# Patient Record
Sex: Female | Born: 1945 | Race: Black or African American | Hispanic: No | Smoking: Former smoker
Health system: Southern US, Community
[De-identification: ages and names within clinical notes are randomized; demographics above are authoritative.]

## PROBLEM LIST (undated history)

## (undated) DIAGNOSIS — F0390 Unspecified dementia without behavioral disturbance: Secondary | ICD-10-CM

## (undated) DIAGNOSIS — K5900 Constipation, unspecified: Secondary | ICD-10-CM

## (undated) DIAGNOSIS — E039 Hypothyroidism, unspecified: Secondary | ICD-10-CM

## (undated) DIAGNOSIS — K219 Gastro-esophageal reflux disease without esophagitis: Secondary | ICD-10-CM

## (undated) DIAGNOSIS — E119 Type 2 diabetes mellitus without complications: Secondary | ICD-10-CM

## (undated) DIAGNOSIS — M1990 Unspecified osteoarthritis, unspecified site: Secondary | ICD-10-CM

## (undated) DIAGNOSIS — I10 Essential (primary) hypertension: Secondary | ICD-10-CM

## (undated) HISTORY — DX: Gastro-esophageal reflux disease without esophagitis: K21.9

## (undated) HISTORY — PX: TUBAL LIGATION: SHX77

## (undated) HISTORY — DX: Unspecified osteoarthritis, unspecified site: M19.90

## (undated) HISTORY — DX: Constipation, unspecified: K59.00

---

## 2009-04-19 ENCOUNTER — Ambulatory Visit: Payer: Self-pay | Admitting: Family Medicine

## 2014-03-15 ENCOUNTER — Ambulatory Visit: Payer: Self-pay | Admitting: Internal Medicine

## 2014-03-16 LAB — IRON AND TIBC
IRON: 36 ug/dL — AB (ref 50–170)
Iron Bind.Cap.(Total): 318 ug/dL (ref 250–450)
Iron Saturation: 11 %
Unbound Iron-Bind.Cap.: 282 ug/dL

## 2014-03-16 LAB — CBC CANCER CENTER
Eosinophil: 2 %
HCT: 39.1 % (ref 35.0–47.0)
HGB: 12.2 g/dL (ref 12.0–16.0)
Lymphocytes: 29 %
MCH: 23.6 pg — ABNORMAL LOW (ref 26.0–34.0)
MCHC: 31.1 g/dL — ABNORMAL LOW (ref 32.0–36.0)
MCV: 76 fL — ABNORMAL LOW (ref 80–100)
Monocytes: 6 %
Platelet: 417 x10 3/mm (ref 150–440)
RBC: 5.15 10*6/uL (ref 3.80–5.20)
RDW: 15.2 % — ABNORMAL HIGH (ref 11.5–14.5)
Segmented Neutrophils: 62 %
Variant Lymphocyte: 1 %
WBC: 11.7 x10 3/mm — ABNORMAL HIGH (ref 3.6–11.0)

## 2014-03-16 LAB — FERRITIN: Ferritin (ARMC): 227 ng/mL (ref 8–388)

## 2014-03-23 ENCOUNTER — Ambulatory Visit: Payer: Self-pay | Admitting: Internal Medicine

## 2014-04-04 LAB — OCCULT BLOOD X 1 CARD TO LAB, STOOL
OCCULT BLOOD, FECES: NEGATIVE
Occult Blood, Feces: NEGATIVE

## 2014-04-22 ENCOUNTER — Ambulatory Visit: Payer: Self-pay | Admitting: Internal Medicine

## 2014-05-06 ENCOUNTER — Ambulatory Visit: Payer: Self-pay | Admitting: Family

## 2014-07-05 ENCOUNTER — Ambulatory Visit: Payer: Self-pay | Admitting: Internal Medicine

## 2014-07-05 LAB — CBC CANCER CENTER
Basophil #: 0.1 x10 3/mm (ref 0.0–0.1)
Basophil %: 0.8 %
EOS ABS: 0.7 x10 3/mm (ref 0.0–0.7)
Eosinophil %: 7.9 %
HCT: 38 % (ref 35.0–47.0)
HGB: 12.1 g/dL (ref 12.0–16.0)
LYMPHS ABS: 2 x10 3/mm (ref 1.0–3.6)
Lymphocyte %: 21.9 %
MCH: 24.5 pg — ABNORMAL LOW (ref 26.0–34.0)
MCHC: 31.9 g/dL — ABNORMAL LOW (ref 32.0–36.0)
MCV: 77 fL — ABNORMAL LOW (ref 80–100)
MONO ABS: 0.7 x10 3/mm (ref 0.2–0.9)
Monocyte %: 7.8 %
Neutrophil #: 5.7 x10 3/mm (ref 1.4–6.5)
Neutrophil %: 61.6 %
Platelet: 302 x10 3/mm (ref 150–440)
RBC: 4.95 10*6/uL (ref 3.80–5.20)
RDW: 16 % — ABNORMAL HIGH (ref 11.5–14.5)
WBC: 9.3 x10 3/mm (ref 3.6–11.0)

## 2014-07-05 LAB — IRON AND TIBC
IRON BIND. CAP.(TOTAL): 334 ug/dL (ref 250–450)
IRON: 54 ug/dL (ref 50–170)
Iron Saturation: 16 %
Unbound Iron-Bind.Cap.: 280 ug/dL

## 2014-07-05 LAB — FERRITIN: FERRITIN (ARMC): 159 ng/mL (ref 8–388)

## 2014-07-23 ENCOUNTER — Ambulatory Visit: Payer: Self-pay | Admitting: Internal Medicine

## 2018-03-19 ENCOUNTER — Other Ambulatory Visit: Payer: Self-pay | Admitting: Family Medicine

## 2018-03-19 DIAGNOSIS — Z1231 Encounter for screening mammogram for malignant neoplasm of breast: Secondary | ICD-10-CM

## 2018-06-03 ENCOUNTER — Ambulatory Visit
Admission: RE | Admit: 2018-06-03 | Discharge: 2018-06-03 | Disposition: A | Discharge status: Home / Self Care | Payer: No Typology Code available for payment source | Source: Ambulatory Visit | Attending: Family Medicine | Admitting: Family Medicine

## 2018-06-03 ENCOUNTER — Encounter: Payer: Self-pay | Admitting: Radiology

## 2018-06-03 DIAGNOSIS — Z1231 Encounter for screening mammogram for malignant neoplasm of breast: Secondary | ICD-10-CM | POA: Insufficient documentation

## 2018-07-20 ENCOUNTER — Other Ambulatory Visit: Payer: Self-pay | Admitting: Family Medicine

## 2018-07-20 DIAGNOSIS — R921 Mammographic calcification found on diagnostic imaging of breast: Secondary | ICD-10-CM

## 2018-07-20 DIAGNOSIS — R928 Other abnormal and inconclusive findings on diagnostic imaging of breast: Secondary | ICD-10-CM

## 2018-07-29 ENCOUNTER — Ambulatory Visit
Admission: RE | Admit: 2018-07-29 | Discharge: 2018-07-29 | Disposition: A | Discharge status: Home / Self Care | Payer: No Typology Code available for payment source | Source: Ambulatory Visit | Attending: Family Medicine | Admitting: Family Medicine

## 2018-07-29 DIAGNOSIS — R928 Other abnormal and inconclusive findings on diagnostic imaging of breast: Secondary | ICD-10-CM | POA: Insufficient documentation

## 2018-07-29 DIAGNOSIS — R921 Mammographic calcification found on diagnostic imaging of breast: Secondary | ICD-10-CM

## 2018-08-06 ENCOUNTER — Other Ambulatory Visit: Payer: Self-pay | Admitting: Family Medicine

## 2018-08-06 DIAGNOSIS — R921 Mammographic calcification found on diagnostic imaging of breast: Secondary | ICD-10-CM

## 2018-08-06 DIAGNOSIS — R928 Other abnormal and inconclusive findings on diagnostic imaging of breast: Secondary | ICD-10-CM

## 2018-09-02 ENCOUNTER — Ambulatory Visit
Admission: RE | Admit: 2018-09-02 | Discharge: 2018-09-02 | Disposition: A | Discharge status: Home / Self Care | Payer: No Typology Code available for payment source | Source: Ambulatory Visit | Attending: Family Medicine | Admitting: Family Medicine

## 2018-09-02 DIAGNOSIS — R928 Other abnormal and inconclusive findings on diagnostic imaging of breast: Secondary | ICD-10-CM | POA: Diagnosis not present

## 2018-09-02 DIAGNOSIS — R921 Mammographic calcification found on diagnostic imaging of breast: Secondary | ICD-10-CM

## 2018-09-02 HISTORY — PX: BREAST BIOPSY: SHX20

## 2018-09-03 LAB — SURGICAL PATHOLOGY

## 2018-09-11 ENCOUNTER — Other Ambulatory Visit: Payer: Self-pay | Admitting: Family Medicine

## 2018-09-11 DIAGNOSIS — Z1231 Encounter for screening mammogram for malignant neoplasm of breast: Secondary | ICD-10-CM

## 2020-02-09 ENCOUNTER — Other Ambulatory Visit: Payer: Self-pay

## 2020-02-09 ENCOUNTER — Inpatient Hospital Stay: Payer: Medicare (Managed Care)

## 2020-02-09 ENCOUNTER — Encounter: Payer: Self-pay | Admitting: Emergency Medicine

## 2020-02-09 ENCOUNTER — Inpatient Hospital Stay
Admission: EM | Admit: 2020-02-09 | Discharge: 2020-02-14 | DRG: 683 | Disposition: A | Discharge status: Skilled Nursing Facility | Payer: Medicare (Managed Care) | Attending: Internal Medicine | Admitting: Internal Medicine

## 2020-02-09 DIAGNOSIS — R1310 Dysphagia, unspecified: Secondary | ICD-10-CM | POA: Diagnosis present

## 2020-02-09 DIAGNOSIS — Z79899 Other long term (current) drug therapy: Secondary | ICD-10-CM | POA: Diagnosis not present

## 2020-02-09 DIAGNOSIS — E1169 Type 2 diabetes mellitus with other specified complication: Secondary | ICD-10-CM | POA: Diagnosis present

## 2020-02-09 DIAGNOSIS — Z8249 Family history of ischemic heart disease and other diseases of the circulatory system: Secondary | ICD-10-CM | POA: Diagnosis not present

## 2020-02-09 DIAGNOSIS — Z79890 Hormone replacement therapy (postmenopausal): Secondary | ICD-10-CM

## 2020-02-09 DIAGNOSIS — Z66 Do not resuscitate: Secondary | ICD-10-CM | POA: Diagnosis present

## 2020-02-09 DIAGNOSIS — I10 Essential (primary) hypertension: Secondary | ICD-10-CM | POA: Diagnosis present

## 2020-02-09 DIAGNOSIS — R531 Weakness: Secondary | ICD-10-CM | POA: Diagnosis not present

## 2020-02-09 DIAGNOSIS — Z87891 Personal history of nicotine dependence: Secondary | ICD-10-CM | POA: Diagnosis not present

## 2020-02-09 DIAGNOSIS — Z20822 Contact with and (suspected) exposure to covid-19: Secondary | ICD-10-CM | POA: Diagnosis present

## 2020-02-09 DIAGNOSIS — Z9181 History of falling: Secondary | ICD-10-CM | POA: Diagnosis not present

## 2020-02-09 DIAGNOSIS — N289 Disorder of kidney and ureter, unspecified: Secondary | ICD-10-CM

## 2020-02-09 DIAGNOSIS — E039 Hypothyroidism, unspecified: Secondary | ICD-10-CM | POA: Diagnosis present

## 2020-02-09 DIAGNOSIS — I951 Orthostatic hypotension: Secondary | ICD-10-CM | POA: Diagnosis present

## 2020-02-09 DIAGNOSIS — R197 Diarrhea, unspecified: Secondary | ICD-10-CM | POA: Diagnosis not present

## 2020-02-09 DIAGNOSIS — E1122 Type 2 diabetes mellitus with diabetic chronic kidney disease: Secondary | ICD-10-CM | POA: Diagnosis not present

## 2020-02-09 DIAGNOSIS — F0390 Unspecified dementia without behavioral disturbance: Secondary | ICD-10-CM | POA: Diagnosis present

## 2020-02-09 DIAGNOSIS — M25511 Pain in right shoulder: Secondary | ICD-10-CM | POA: Diagnosis not present

## 2020-02-09 DIAGNOSIS — M19011 Primary osteoarthritis, right shoulder: Secondary | ICD-10-CM | POA: Diagnosis present

## 2020-02-09 DIAGNOSIS — M47812 Spondylosis without myelopathy or radiculopathy, cervical region: Secondary | ICD-10-CM | POA: Diagnosis present

## 2020-02-09 DIAGNOSIS — N390 Urinary tract infection, site not specified: Secondary | ICD-10-CM

## 2020-02-09 DIAGNOSIS — N179 Acute kidney failure, unspecified: Secondary | ICD-10-CM | POA: Diagnosis not present

## 2020-02-09 DIAGNOSIS — Z7982 Long term (current) use of aspirin: Secondary | ICD-10-CM | POA: Diagnosis not present

## 2020-02-09 DIAGNOSIS — N3000 Acute cystitis without hematuria: Secondary | ICD-10-CM | POA: Diagnosis present

## 2020-02-09 DIAGNOSIS — G8929 Other chronic pain: Secondary | ICD-10-CM | POA: Diagnosis not present

## 2020-02-09 DIAGNOSIS — E785 Hyperlipidemia, unspecified: Secondary | ICD-10-CM | POA: Diagnosis not present

## 2020-02-09 DIAGNOSIS — N186 End stage renal disease: Secondary | ICD-10-CM

## 2020-02-09 DIAGNOSIS — G4700 Insomnia, unspecified: Secondary | ICD-10-CM | POA: Diagnosis not present

## 2020-02-09 DIAGNOSIS — Z992 Dependence on renal dialysis: Secondary | ICD-10-CM | POA: Diagnosis not present

## 2020-02-09 DIAGNOSIS — R52 Pain, unspecified: Secondary | ICD-10-CM

## 2020-02-09 DIAGNOSIS — I959 Hypotension, unspecified: Secondary | ICD-10-CM

## 2020-02-09 HISTORY — DX: Essential (primary) hypertension: I10

## 2020-02-09 HISTORY — DX: Hypothyroidism, unspecified: E03.9

## 2020-02-09 HISTORY — DX: Type 2 diabetes mellitus without complications: E11.9

## 2020-02-09 LAB — COMPREHENSIVE METABOLIC PANEL
ALT: 29 U/L (ref 0–44)
AST: 37 U/L (ref 15–41)
Albumin: 2.8 g/dL — ABNORMAL LOW (ref 3.5–5.0)
Alkaline Phosphatase: 76 U/L (ref 38–126)
Anion gap: 13 (ref 5–15)
BUN: 79 mg/dL — ABNORMAL HIGH (ref 8–23)
CO2: 27 mmol/L (ref 22–32)
Calcium: 8.7 mg/dL — ABNORMAL LOW (ref 8.9–10.3)
Chloride: 92 mmol/L — ABNORMAL LOW (ref 98–111)
Creatinine, Ser: 2.63 mg/dL — ABNORMAL HIGH (ref 0.44–1.00)
GFR calc Af Amer: 20 mL/min — ABNORMAL LOW (ref >60)
GFR calc non Af Amer: 17 mL/min — ABNORMAL LOW (ref >60)
Glucose, Bld: 177 mg/dL — ABNORMAL HIGH (ref 70–99)
Potassium: 4 mmol/L (ref 3.5–5.1)
Sodium: 132 mmol/L — ABNORMAL LOW (ref 135–145)
Total Bilirubin: 1 mg/dL (ref 0.3–1.2)
Total Protein: 6 g/dL — ABNORMAL LOW (ref 6.5–8.1)

## 2020-02-09 LAB — URINALYSIS, COMPLETE (UACMP) WITH MICROSCOPIC
Bilirubin Urine: NEGATIVE
Glucose, UA: NEGATIVE mg/dL
Hgb urine dipstick: NEGATIVE
Ketones, ur: NEGATIVE mg/dL
Nitrite: NEGATIVE
Protein, ur: NEGATIVE mg/dL
Specific Gravity, Urine: 1.013 (ref 1.005–1.030)
pH: 6 (ref 5.0–8.0)

## 2020-02-09 LAB — CBC
HCT: 35.3 % — ABNORMAL LOW (ref 36.0–46.0)
Hemoglobin: 11.8 g/dL — ABNORMAL LOW (ref 12.0–15.0)
MCH: 25.3 pg — ABNORMAL LOW (ref 26.0–34.0)
MCHC: 33.4 g/dL (ref 30.0–36.0)
MCV: 75.6 fL — ABNORMAL LOW (ref 80.0–100.0)
Platelets: 253 10*3/uL (ref 150–400)
RBC: 4.67 MIL/uL (ref 3.87–5.11)
RDW: 15.9 % — ABNORMAL HIGH (ref 11.5–15.5)
WBC: 11.8 10*3/uL — ABNORMAL HIGH (ref 4.0–10.5)
nRBC: 0.4 % — ABNORMAL HIGH (ref 0.0–0.2)

## 2020-02-09 LAB — HEMOGLOBIN A1C
Hgb A1c MFr Bld: 7.4 % — ABNORMAL HIGH (ref 4.8–5.6)
Mean Plasma Glucose: 165.68 mg/dL

## 2020-02-09 LAB — GLUCOSE, CAPILLARY
Glucose-Capillary: 172 mg/dL — ABNORMAL HIGH (ref 70–99)
Glucose-Capillary: 97 mg/dL (ref 70–99)

## 2020-02-09 LAB — CK: Total CK: 270 U/L — ABNORMAL HIGH (ref 38–234)

## 2020-02-09 MED ORDER — ACETAMINOPHEN 650 MG RE SUPP: 650.0000 mg | Freq: Four times a day (QID) | RECTAL | Status: DC | PRN

## 2020-02-09 MED ORDER — HEPARIN SODIUM (PORCINE) 5000 UNIT/ML IJ SOLN
5000.0000 [IU] | Freq: Three times a day (TID) | INTRAMUSCULAR | Status: DC
  Administered 2020-02-09 – 2020-02-14 (×15): 5000 [IU] via SUBCUTANEOUS
  Filled 2020-02-09 (×15): qty 1

## 2020-02-09 MED ORDER — SODIUM CHLORIDE 0.9 % IV SOLN: INTRAVENOUS | Status: DC

## 2020-02-09 MED ORDER — DULOXETINE HCL 30 MG PO CPEP
60.0000 mg | ORAL_CAPSULE | Freq: Every day | ORAL | Status: DC
  Administered 2020-02-10 – 2020-02-14 (×5): 60 mg via ORAL
  Filled 2020-02-09 (×4): qty 2
  Filled 2020-02-09: qty 1

## 2020-02-09 MED ORDER — SODIUM CHLORIDE 0.9 % IV SOLN
1.0000 g | Freq: Once | INTRAVENOUS | Status: AC
  Administered 2020-02-09: 1 g via INTRAVENOUS
  Filled 2020-02-09: qty 10

## 2020-02-09 MED ORDER — INSULIN ASPART 100 UNIT/ML ~~LOC~~ SOLN
0.0000 [IU] | Freq: Three times a day (TID) | SUBCUTANEOUS | Status: DC
  Administered 2020-02-09: 2 [IU] via SUBCUTANEOUS
  Administered 2020-02-10: 1 [IU] via SUBCUTANEOUS
  Administered 2020-02-11: 2 [IU] via SUBCUTANEOUS
  Administered 2020-02-11: 1 [IU] via SUBCUTANEOUS
  Administered 2020-02-12: 3 [IU] via SUBCUTANEOUS
  Administered 2020-02-12 – 2020-02-13 (×3): 2 [IU] via SUBCUTANEOUS
  Administered 2020-02-13 – 2020-02-14 (×3): 1 [IU] via SUBCUTANEOUS
  Filled 2020-02-09 (×11): qty 1

## 2020-02-09 MED ORDER — ONDANSETRON HCL 4 MG PO TABS: 4.0000 mg | ORAL_TABLET | Freq: Four times a day (QID) | ORAL | Status: DC | PRN

## 2020-02-09 MED ORDER — ONDANSETRON HCL 4 MG/2ML IJ SOLN: 4.0000 mg | Freq: Four times a day (QID) | INTRAMUSCULAR | Status: DC | PRN

## 2020-02-09 MED ORDER — LEVOTHYROXINE SODIUM 50 MCG PO TABS
50.0000 ug | ORAL_TABLET | Freq: Every day | ORAL | Status: DC
  Administered 2020-02-10 – 2020-02-14 (×5): 50 ug via ORAL
  Filled 2020-02-09 (×5): qty 1

## 2020-02-09 MED ORDER — ASPIRIN EC 81 MG PO TBEC
81.0000 mg | DELAYED_RELEASE_TABLET | Freq: Every day | ORAL | Status: DC
  Administered 2020-02-10 – 2020-02-14 (×5): 81 mg via ORAL
  Filled 2020-02-09 (×5): qty 1

## 2020-02-09 MED ORDER — INSULIN ASPART 100 UNIT/ML ~~LOC~~ SOLN: 0.0000 [IU] | Freq: Every day | SUBCUTANEOUS | Status: DC

## 2020-02-09 MED ORDER — ACETAMINOPHEN 325 MG PO TABS
650.0000 mg | ORAL_TABLET | Freq: Four times a day (QID) | ORAL | Status: DC | PRN
  Administered 2020-02-10 – 2020-02-13 (×5): 650 mg via ORAL
  Filled 2020-02-09 (×6): qty 2

## 2020-02-09 NOTE — H&P (Signed)
[facility] NAME: Alexis Mclaughlin    MR#:  [id number]  DATE OF BIRTH:  November 25, 1946  DATE OF ADMISSION:  02/09/2020  PRIMARY CARE PHYSICIAN: [facility]  REQUESTING/REFERRING PHYSICIAN: Dr Harvest Dark  CHIEF COMPLAINT:   Chief Complaint  Patient presents with  . Urinary Tract Infection    HISTORY OF PRESENT ILLNESS:  Alexis Mclaughlin  is a 74 y.o. female with a known history of hypertension and diabetes.  Today she was sent in by the pace program because they found her on the ground and she could not get up.  The patient does not complain of any burning on urination or any blood in the urine and otherwise feels okay.  She complains of some chronic shoulder pain.  Nothing hurts from the fall.  She does feel little bit weaker.  In the ER, her creatinine was up at 2.63 and her blood pressure on the lower side and she did have a positive urine analysis.  Hospitalist services were contacted for admission.  PAST MEDICAL HISTORY:   Past Medical History:  Diagnosis Date  . Diabetes mellitus without complication ([place])   . Hypertension   . Hypothyroidism     PAST SURGICAL HISTORY:   Past Surgical History:  Procedure Laterality Date  . BREAST BIOPSY Right 09/02/2018   affirm stereo/path pending    SOCIAL HISTORY:   Social History   Tobacco Use  . Smoking status: Former Research scientist (life sciences)  . Smokeless tobacco: Never Used  Substance Use Topics  . Alcohol use: Not Currently    FAMILY HISTORY:   Family History  Problem Relation Age of Onset  . CAD Mother   . CAD Father     DRUG ALLERGIES:  No Known Allergies  REVIEW OF SYSTEMS:  CONSTITUTIONAL: No fever, fatigue or weakness.  EYES: No blurred or double vision.  EARS, NOSE, AND THROAT: No tinnitus or ear pain. No sore throat RESPIRATORY: No cough, shortness of breath, wheezing or hemoptysis.  CARDIOVASCULAR: No chest pain, orthopnea, edema.  GASTROINTESTINAL: No nausea,  vomiting, diarrhea or abdominal pain. No blood in bowel movements GENITOURINARY: No dysuria, hematuria.  ENDOCRINE: No polyuria, nocturia,  HEMATOLOGY: No anemia, easy bruising or bleeding SKIN: No rash or lesion. MUSCULOSKELETAL: Chronic right shoulder pain NEUROLOGIC: No tingling, numbness.  PSYCHIATRY: No anxiety or depression.   MEDICATIONS AT HOME:   Prior to Admission medications   Medication Sig Start Date End Date Taking? Authorizing Provider  aspirin EC 81 MG tablet Take 81 mg by mouth daily.    [provider]  atenolol (TENORMIN) 100 MG tablet Take 100 mg by mouth daily.    [provider]  atorvastatin (LIPITOR) 80 MG tablet Take 80 mg by mouth daily.    [provider]  DULoxetine (CYMBALTA) 60 MG capsule Take 60 mg by mouth daily.    [provider]  levothyroxine (SYNTHROID) 50 MCG tablet Take 50 mcg by mouth daily before breakfast.    [provider]  losartan-hydrochlorothiazide (HYZAAR) 100-25 MG tablet Take 1 tablet by mouth daily.    [provider]  sulfamethoxazole-trimethoprim (BACTRIM DS) 800-160 MG tablet Take 1 tablet by mouth 2 (two) times daily.    [provider]      VITAL SIGNS:  Blood pressure (!) 92/53, pulse 69, temperature 98.8 F (37.1 C), temperature source Oral, resp. rate 16, height 5\' 3"  (1.6 m), weight 90.7 kg, SpO2 100 %.  PHYSICAL EXAMINATION:  GENERAL:  74  y.o.-year-old patient lying in the bed with no acute distress.  EYES: Pupils equal, round, reactive to light and accommodation. No scleral icterus. Extraocular muscles intact.  HEENT: Head atraumatic, normocephalic. Oropharynx and nasopharynx clear.  NECK:  Supple, no jugular venous distention. No thyroid enlargement, no tenderness.  LUNGS: Normal breath sounds bilaterally, no wheezing, rales,rhonchi or crepitation. No use of accessory muscles of respiration.  CARDIOVASCULAR: S1, S2 normal. No murmurs, rubs, or gallops.   ABDOMEN: Soft, nontender, nondistended. Bowel sounds present. No organomegaly or mass.  EXTREMITIES: No pedal edema, cyanosis, or clubbing.  No pain to palpation over the hips.  Slight pain in the right shoulder to palpation.  Limited range of motion right shoulder unable to lift fully overhead. NEUROLOGIC: Cranial nerves II through XII are intact. Muscle strength 5/5 in all extremities. Sensation intact. Gait not checked.  Patient able to straight leg raise. PSYCHIATRIC: The patient is alert and answers questions appropriately.  SKIN: No rash, lesion, or ulcer.   LABORATORY PANEL:   CBC Recent Labs  Lab 02/09/20 1349  WBC 11.8*  HGB 11.8*  HCT 35.3*  PLT 253   ------------------------------------------------------------------------------------------------------------------  Chemistries  Recent Labs  Lab 02/09/20 1349  NA 132*  K 4.0  CL 92*  CO2 27  GLUCOSE 177*  BUN 79*  CREATININE 2.63*  CALCIUM 8.7*  AST 37  ALT 29  ALKPHOS 76  BILITOT 1.0   ------------------------------------------------------------------------------------------------------------------    IMPRESSION AND PLAN:   1.  Acute kidney injury.  Creatinine 2.63 today and yesterday with labs was 2.39.  Looking at her baseline was 0.85 back in September 2020.  Gentle IV fluid hydration.  Hold antihypertensive medications.  Discontinue Bactrim. 2.  Relative hypotension.  Give IV fluids.  Hold atenolol and Hyzaar. 3.  Acute cystitis without hematuria, with leukocytosis.  Follow-up urine culture.  On Rocephin. 4.  Type 2 diabetes mellitus with hyperlipidemia we will put on sliding scale.  Add on a hemoglobin A1c 5.  Right shoulder pain with limited motion of the right shoulder will get an x-ray and Occupational Therapy evaluation. 6.  Hypothyroidism unspecified on levothyroxine 7.  Weakness.  We will get physical therapy evaluation 8.  Hyperlipidemia unspecified.  We will add on a CPK.  Hold statin until  CPK back. 9.  Reviewed MOST form sent in by pace physicians and she is a DNR.  Patient was also told me she is a DNR.  All the records are reviewed and case discussed with ED provider. Management plans discussed with the patient, family and they are in agreement.  CODE STATUS: DNR  TOTAL TIME TAKING CARE OF THIS PATIENT: 50 minutes.    Loletha Grayer M.D on 02/09/2020 at 3:27 PM  Between 7am to 6pm - Pager - [phone]  After 6pm call admission pager [phone]  [facility]  CC: Primary care physician; pace physician

## 2020-02-09 NOTE — ED Provider Notes (Signed)
[facility] Emergency Department Provider Note  Time seen: 1:43 PM  I have reviewed the triage vital signs and the nursing notes.   HISTORY  Chief Complaint Urinary Tract Infection  HPI Alexis Mclaughlin is a 74 y.o. female who presents to the emergency department for possible urinary tract infection.   Patient is coming from [facility] per EMS, was diagnosed with urinary tract infection.  Home health was at the patient's home going to give her an antibiotic the family stated patient has been somewhat uncooperative recently and they wanted her sent to the emergency department for further evaluation.  Currently the patient is awake alert oriented x4, no acute distress.  Patient initially reluctant to lab work but is ultimately agreeable.  Patient denies any fever cough or shortness of breath.  Denies any abdominal pain or dysuria.  No past medical history on file.  There are no problems to display for this patient.   Past Surgical History:  Procedure Laterality Date  . BREAST BIOPSY Right 09/02/2018   affirm stereo/path pending    Prior to Admission medications   Not on File    Not on File  No family history on file.  Social History Social History   Tobacco Use  . Smoking status: Not on file  Substance Use Topics  . Alcohol use: Not on file  . Drug use: Not on file    Review of Systems Constitutional: Negative for fever. Cardiovascular: Negative for chest pain. Respiratory: Negative for shortness of breath. Gastrointestinal: Negative for abdominal pain Genitourinary: Denies dysuria. Musculoskeletal: Negative for musculoskeletal complaints Neurological: Negative for headache All other ROS negative  ____________________________________________   PHYSICAL EXAM:  Constitutional: Alert and oriented. Well appearing and in no distress. Eyes: Normal exam ENT      Head: Normocephalic and atraumatic.      Mouth/Throat: Mucous membranes are  moist. Cardiovascular: Normal rate, regular rhythm. Respiratory: Normal respiratory effort without tachypnea nor retractions. Breath sounds are clear  Gastrointestinal: Soft and nontender. No distention.  Musculoskeletal: Nontender with normal range of motion in all extremities. Neurologic:  Normal speech and language. No gross focal neurologic deficits Skin:  Skin is warm, dry and intact.  Psychiatric: Mood and affect are normal.   ____________________________________________  INITIAL IMPRESSION / ASSESSMENT AND PLAN / ED COURSE  Pertinent labs & imaging results that were available during my care of the patient were reviewed by me and considered in my medical decision making (see chart for details).   Patient presents to the emergency department for evaluation of uncooperative behavior and recently diagnosed urinary tract infection.  Currently patient is awake alert oriented x4.  Denies any pain.  Denies dysuria.  Largely negative review of systems.  When asked how she knows she has urinary tract infection she states because "they told me so."  As home health was involved I presume patient likely had a urinalysis performed already.  We will check a urine sample, blood work and continue to closely monitor.  Patient's urinalysis shows possible urinary tract infection.  Patient's lab work shows a creatinine of 2.6.  I was able to talk to the pace physician who states her baseline creatinine is around 1.5.  States the patient has been confused over the past 1 week, has not been taking any of her medications.  They went to the house today to try to treat her for her urinary tract infection, however they found the patient on the ground and she appeared somewhat confused they  were able to get the patient in bed able to start IV fluids.  However they thought due to the patient's confusion and weakness along with labs showing renal insufficiency the patient would benefit from an admission to the  hospital.  Alexis Mclaughlin was evaluated in Emergency Department on 02/09/2020 for the symptoms described in the history of present illness. She was evaluated in the context of the global COVID-19 pandemic, which necessitated consideration that the patient might be at risk for infection with the SARS-CoV-2 virus that causes COVID-19. Institutional protocols and algorithms that pertain to the evaluation of patients at risk for COVID-19 are in a state of rapid change based on information released by regulatory bodies including the CDC and federal and state organizations. These policies and algorithms were followed during the patient's care in the ED.  ____________________________________________   FINAL CLINICAL IMPRESSION(S) / ED DIAGNOSES  Urinary tract infection Confusion Weakness   Harvest Dark, MD 02/09/20 1446

## 2020-02-09 NOTE — ED Triage Notes (Signed)
Pt in via [facility] from [facility]; per EMS pt with diagnosed UTI, home health was on scene with plans to give antibiotic but family wanted patient transported due to uncooperative behavior and patient living at home alone.  Pt A/Ox3, NAD noted upon arrival.

## 2020-02-10 DIAGNOSIS — I951 Orthostatic hypotension: Secondary | ICD-10-CM

## 2020-02-10 DIAGNOSIS — N179 Acute kidney failure, unspecified: Secondary | ICD-10-CM | POA: Diagnosis present

## 2020-02-10 LAB — CBC
HCT: 36.3 % (ref 36.0–46.0)
Hemoglobin: 11.9 g/dL — ABNORMAL LOW (ref 12.0–15.0)
MCH: 25.3 pg — ABNORMAL LOW (ref 26.0–34.0)
MCHC: 32.8 g/dL (ref 30.0–36.0)
MCV: 77.1 fL — ABNORMAL LOW (ref 80.0–100.0)
Platelets: 253 10*3/uL (ref 150–400)
RBC: 4.71 MIL/uL (ref 3.87–5.11)
RDW: 15.8 % — ABNORMAL HIGH (ref 11.5–15.5)
WBC: 10.1 10*3/uL (ref 4.0–10.5)
nRBC: 0 % (ref 0.0–0.2)

## 2020-02-10 LAB — BASIC METABOLIC PANEL
Anion gap: 7 (ref 5–15)
BUN: 65 mg/dL — ABNORMAL HIGH (ref 8–23)
CO2: 31 mmol/L (ref 22–32)
Calcium: 8.5 mg/dL — ABNORMAL LOW (ref 8.9–10.3)
Chloride: 99 mmol/L (ref 98–111)
Creatinine, Ser: 2.12 mg/dL — ABNORMAL HIGH (ref 0.44–1.00)
GFR calc Af Amer: 26 mL/min — ABNORMAL LOW (ref >60)
GFR calc non Af Amer: 22 mL/min — ABNORMAL LOW (ref >60)
Glucose, Bld: 109 mg/dL — ABNORMAL HIGH (ref 70–99)
Potassium: 3.5 mmol/L (ref 3.5–5.1)
Sodium: 137 mmol/L (ref 135–145)

## 2020-02-10 LAB — URINE CULTURE: Culture: <10000 — AB

## 2020-02-10 LAB — GLUCOSE, CAPILLARY
Glucose-Capillary: 111 mg/dL — ABNORMAL HIGH (ref 70–99)
Glucose-Capillary: 137 mg/dL — ABNORMAL HIGH (ref 70–99)
Glucose-Capillary: 100 mg/dL — ABNORMAL HIGH (ref 70–99)

## 2020-02-10 LAB — SARS CORONAVIRUS 2 (TAT 6-24 HRS): SARS Coronavirus 2: NEGATIVE

## 2020-02-10 NOTE — Evaluation (Signed)
Physical Therapy Evaluation Patient Details Name: Alexis Mclaughlin MRN: [id number] DOB: 10-30-1946 Today's Date: 02/10/2020   History of Present Illness  Alexis Mclaughlin is a 86yoF who comes to [facility] on 02/09/20 after found on floor unable to get up. PMH: DM2, HTN. Pt is a [facility] program participant. Pt admitted for UTI. Pt denies any injury from fall, but has chronic shoulder pain. Initially with soft BP now resolved.  Clinical Impression  Pt admitted with above diagnosis. Pt currently with functional limitations due to the deficits listed below (see "PT Problem List"). Upon entry, pt in bed, awake and working with OT. Pt unable to say whether she was working with therapies at [facility] at any point, but reports being home from [place] since [place] cases have been high in community. Pt reports additional falls at home since that point. Chief Strategy Officer and OT together assisting with mobility assessment as patient is anxious about falling, also having pain with mobility. ModA supine to EOB, pt inittially impulsively retracts to supine, but eventually is agreeable to attempt long sitting in bed. Pt eventually able to sit unsupperted, BP assess seated with large orthostatic drop (>63mmHg SBP), RN made aware, pt asymptomatic. Functional mobility assessment demonstrates increased effort/time requirements, poor tolerance, and need for physical assistance, whereas the patient performed these at a higher level of independence PTA. Pt essentially nonambulatory at this time, unsafe to return to home with minimal support. A STR stay would allow quick return to baseline mobility and eventual return to home. Pt will benefit from skilled PT intervention to increase independence and safety with basic mobility in preparation for discharge to the venue listed below.       Follow Up Recommendations SNF;Supervision for mobility/OOB;Supervision - Intermittent    Equipment Recommendations  None recommended by PT    Recommendations for Other  Services       Precautions / Restrictions Precautions Precautions: Fall Restrictions Weight Bearing Restrictions: No Other Position/Activity Restrictions: monitor BP. Pt 102/61 at start of session in lying, unsure of efficacy of sitting BP, but read 63/52 in long sitting, and then 95/68 when returned to lying in bed.      Mobility  Bed Mobility Overal bed mobility: Needs Assistance Bed Mobility: Supine to Sit;Sit to Supine     Supine to sit: Mod assist;+2 for physical assistance Sit to supine: Mod assist;+2 for physical assistance   General bed mobility comments: Eventually able to sit independentlly with encouragement, but refuses 2nd attempt to EOB d/t falls anxiety. Pt c/o pain in many places whence support is offerd physically.  Transfers Overall transfer level: (pt refuses to attempt d/t weakness, pain, falls anxiety.)               General transfer comment: unable to attempt transfers at this time, pt fearful to sit up and requires assistance of 2 people to sit, but in addition, pt states she is not going to stand today. Education about importance of OOB provided.  Ambulation/Gait                Stairs            Wheelchair Mobility    Modified Rankin (Stroke Patients Only)       Balance Overall balance assessment: History of Falls;Mild deficits observed, not formally tested Sitting-balance support: Bilateral upper extremity supported;Feet unsupported(pt on stretcher in ED, feet unable to reach floor d/t height.) Sitting balance-Leahy Scale: Poor Sitting balance - Comments: Pt requries intermittent MIN to MOD A for seated support  in long sitting, EOB sitting not achieved on this assessment.       Standing balance comment: unable to assess                             Pertinent Vitals/Pain Pain Assessment: Faces Faces Pain Scale: Hurts even more Pain Location: R shoulder mostly seems to elicit grimace, but pt states "both of them"  referring to Bilat shoulders when asked where pain is Pain Descriptors / Indicators: Aching;Grimacing Pain Intervention(s): Limited activity within patient's tolerance;Monitored during session;Repositioned    Home Living Family/patient expects to be discharged to:: Private residence Living Arrangements: Alone Available Help at Discharge: Family;Available PRN/intermittently(grand daughter-Bianca, checks on pt regularly) Type of Home: Apartment(third floor) Home Access: Elevator;Level entry     Home Layout: One level Home Equipment: Walker - 2 wheels;Shower seat;Bedside commode;Grab bars - tub/shower      Prior Function Level of Independence: Needs assistance   Gait / Transfers Assistance Needed: Pt reports performing fxl mobility with RW, Reports at least 4 falls in past year.  ADL's / Homemaking Assistance Needed: States that she was performing most ADLs I'ly-was having a HH aide help with bathing, but states it was too cold so she stopped having her come out. When asked how she was bathing for last month, pt states "I haven't". Requires assist for IADLs-[facility] helps with medication. Her Grand daughter Wynona Dove helps with groceries and laundry. Does not drive. Was going to [facility] once/week before COVID pandemic.        Hand Dominance   Dominant Hand: Right    Extremity/Trunk Assessment   Upper Extremity Assessment Upper Extremity Assessment: Generalized weakness(bilaterally, shld flexion 1/2 arc of motion, elbow & grip MMT 3+/5)    Lower Extremity Assessment Lower Extremity Assessment: Generalized weakness(combined knee/hip extension 5/5 bilat (tested supine))       Communication   Communication: No difficulties  Cognition Arousal/Alertness: Awake/alert Behavior During Therapy: WFL for tasks assessed/performed Overall Cognitive Status: No family/caregiver present to determine baseline cognitive functioning                                 General Comments: Pt  oriented to self, knows she fell recently, oriented to year, but not month, day or date. Limited insight into her current situation, her anxieties now a large barrier to most mobility.      General Comments      Exercises Other Exercises Other Exercises: OT facilitates education re: role of OT and importance of OOB activity to prevent PNA and other opportunistic infection or skin breakdown. While pt nods in understanding, reinforcement required as only moderate reception of education detected.   Assessment/Plan    PT Assessment Patient needs continued PT services  PT Problem List Decreased strength;Decreased range of motion;Decreased activity tolerance;Decreased balance;Decreased mobility;Decreased cognition       PT Treatment Interventions DME instruction;Balance training;Gait training;Stair training;Functional mobility training;Therapeutic activities;Therapeutic exercise;Patient/family education    PT Goals (Current goals can be found in the Care Plan section)  Acute Rehab PT Goals Patient Stated Goal: regain her baseline independence in mobility PT Goal Formulation: With patient Time For Goal Achievement: 02/24/20 Potential to Achieve Goals: Fair    Frequency Min 2X/week   Barriers to discharge        Co-evaluation   Reason for Co-Treatment: For patient/therapist safety;To address functional/ADL transfers;Necessary to address cognition/behavior during functional activity  PT goals addressed during session: Mobility/safety with mobility OT goals addressed during session: ADL's and self-care       AM-PAC PT "6 Clicks" Mobility  Outcome Measure Help needed turning from your back to your side while in a flat bed without using bedrails?: A Lot Help needed moving from lying on your back to sitting on the side of a flat bed without using bedrails?: A Lot Help needed moving to and from a bed to a chair (including a wheelchair)?: A Lot Help needed standing up from a chair using  your arms (e.g., wheelchair or bedside chair)?: A Lot Help needed to walk in hospital room?: Total Help needed climbing 3-5 steps with a railing? : Total 6 Click Score: 10    End of Session   Activity Tolerance: Patient limited by pain;Patient limited by fatigue Patient left: in bed;with call bell/phone within reach Nurse Communication: Mobility status PT Visit Diagnosis: Unsteadiness on feet (R26.81);Other abnormalities of gait and mobility (R26.89);Repeated falls (R29.6);Muscle weakness (generalized) (M62.81);History of falling (Z91.81);Difficulty in walking, not elsewhere classified (R26.2)    Time: 1030-1049 PT Time Calculation (min) (ACUTE ONLY): 19 min   Charges:   PT Evaluation $PT Eval Low Complexity: 1 Low          12:35 PM, 02/10/20 Etta Grandchild, PT, DPT Physical Therapist - [facility]  [phone] ([place])     Santana Gosdin C 02/10/2020, 12:30 PM

## 2020-02-10 NOTE — Progress Notes (Signed)
Update given to daughter [name] via telephone. Daughter would like to be called daily with updates.

## 2020-02-10 NOTE — Evaluation (Signed)
Occupational Therapy Evaluation Patient Details Name: Alexis Mclaughlin MRN: [id number] DOB: 13-Feb-1946 Today's Date: 02/10/2020    History of Present Illness Pt is 74 y/o F with PMH: HTN and DM who was sent to hospital by [facility] because found down and could not get up. UA (+), pt adm for UTI.   Clinical Impression   Pt was seen for OT evaluation this date. Prior to hospital admission, pt reports mostly being Indep with BADLs, but did have a [place] aide help with bathing until pt asked them not to come any more-reports being too cold (pt states she has not bathed in last month since [facility] aide came). Pt lives alone in 3rd story apartment with elevator access. Had Granddaughter, Wyatt Portela, who is involved in pt's care and checks on pt. Currently pt demonstrates impairments as described below (See OT problem list) which functionally limit her ability to perform ADL/self-care tasks. Pt currently requires MOD +2 for sup<>sit and declines to participate in ADL transfers which partially appears to be motivated by fear of falling. Pt requires setup for bed level (with HOB elevated) self feeding and grooming, MIN A for higher level UB tasks like dressing, and MAX A for all LB ADLs d/t limited tolerance for sitting tasks and limited ROM. Pt would benefit from skilled OT to address noted impairments and functional limitations (see below for any additional details) in order to maximize safety and independence while minimizing falls risk and caregiver burden. Upon hospital discharge, recommend STR to maximize pt safety and return to PLOF.    Follow Up Recommendations  SNF    Equipment Recommendations  3 in 1 bedside commode    Recommendations for Other Services       Precautions / Restrictions Precautions Precautions: Fall Restrictions Weight Bearing Restrictions: No Other Position/Activity Restrictions: monitor BP. Pt 102/61 at start of session in lying, unsure of efficacy of sitting BP, but read 63/52 in  long sitting, and then 95/68 when returned to lying in bed.      Mobility Bed Mobility Overal bed mobility: Needs Assistance Bed Mobility: Supine to Sit;Sit to Supine     Supine to sit: Mod assist;+2 for physical assistance Sit to supine: Mod assist;+2 for physical assistance   General bed mobility comments: MOD A to manage UB/LB, attempting to bring pt to EOB sitting, but pt fearful and stops halfway, supine to long sitting achieved with 2 person assist, pt requiring seated support with intermittent periods of being able to maintain static sit using UE support.  Transfers                 General transfer comment: unable to attempt transfers at this time, pt fearful to sit up and requires assistance of 2 people to sit, but in addition, pt states she is not going to stand today. Education about importance of OOB provided.    Balance Overall balance assessment: Needs assistance Sitting-balance support: Bilateral upper extremity supported;Feet unsupported(pt on stretcher in ED, feet unable to reach floor d/t height.) Sitting balance-Leahy Scale: Poor Sitting balance - Comments: Pt requries intermittent MIN to MOD A for seated support in long sitting, EOB sitting not achieved on this assessment.       Standing balance comment: unable to assess                           ADL either performed or assessed with clinical judgement   ADL  General ADL Comments: Pt requires setup at bed level with HOB elevated to perform grooming and self feeding (cutting food into smaller pieces) with R hand. MIN A for UB dressing d/t limited UE ROM. Pt requires MAX A for LB ADLs including threading socks at bed level at this time (makes some small contribution, but ultimately, OT dons pt socks). Pt fearful to sit up so no ADLs able to be completed in long sitting on assessment.     Vision Baseline Vision/History: Wears glasses Wears  Glasses: (reports they're reading glasses, but then states she wears them all the time, but left them at home, so unclear use of glasses) Patient Visual Report: No change from baseline       Perception     Praxis      Pertinent Vitals/Pain Pain Assessment: Faces Faces Pain Scale: Hurts even more Pain Location: R shoulder mostly seems to elicit grimace, but pt states "both of them" referring to Bilat shoulders when asked where pain is Pain Descriptors / Indicators: Aching;Grimacing Pain Intervention(s): Monitored during session     Hand Dominance Right   Extremity/Trunk Assessment Upper Extremity Assessment Upper Extremity Assessment: Generalized weakness(bilaterally, shld flexion 1/2 arc of motion, elbow & grip MMT 3+/5)   Lower Extremity Assessment Lower Extremity Assessment: Defer to PT evaluation;Generalized weakness       Communication Communication Communication: No difficulties   Cognition Arousal/Alertness: Awake/alert Behavior During Therapy: WFL for tasks assessed/performed Overall Cognitive Status: No family/caregiver present to determine baseline cognitive functioning                                 General Comments: Pt oriented to self, knows she fell recently, oriented to year, but not month, day or date. Pt somewhat appropriate with command following, but somewhat impulsive during session (ex: flops back to bed w/o indicating needing to lie down on initial sup to sit trial), some garbled speech   General Comments       Exercises Other Exercises Other Exercises: OT facilitates education re: role of OT and importance of OOB activity to prevent PNA and other opportunistic infection or skin breakdown. While pt nods in understanding, reinforcement required as only moderate reception of education detected.   Shoulder Instructions      Home Living Family/patient expects to be discharged to:: Private residence Living Arrangements: Alone Available  Help at Discharge: Family;Available PRN/intermittently(grand daughter-Bianca, checks on pt regularly) Type of Home: Apartment(third floor) Home Access: Elevator;Level entry     Home Layout: One level     Bathroom Shower/Tub: Tub/shower unit         Home Equipment: Environmental consultant - 2 wheels;Shower seat;Bedside commode;Grab bars - tub/shower          Prior Functioning/Environment Level of Independence: Needs assistance  Gait / Transfers Assistance Needed: Pt reports performing fxl mobility with RW, Reports at least 4 falls in past year. ADL's / Homemaking Assistance Needed: States that she was performing most ADLs I'ly-was having a HH aide help with bathing, but states it was too cold so she stopped having her come out. When asked how she was bathing for last month, pt states "I haven't". Requires assist for IADLs-[facility] helps with medication. Her Grand daughter Wynona Dove helps with groceries and laundry. Does not drive. Was going to [facility] once/week before COVID pandemic.            OT Problem List: Decreased strength;Decreased range of motion;Decreased activity  tolerance;Impaired balance (sitting and/or standing);Decreased safety awareness;Decreased knowledge of use of DME or AE;Obesity;Impaired UE functional use;Pain      OT Treatment/Interventions: Self-care/ADL training;Therapeutic exercise;Energy conservation;DME and/or AE instruction;Therapeutic activities;Patient/family education;Balance training    OT Goals(Current goals can be found in the care plan section) Acute Rehab OT Goals Patient Stated Goal: to be able to move better OT Goal Formulation: With patient Time For Goal Achievement: 02/24/20 Potential to Achieve Goals: Good  OT Frequency: Min 2X/week   Barriers to D/C: Decreased caregiver support          Co-evaluation PT/OT/SLP Co-Evaluation/Treatment: Yes Reason for Co-Treatment: Complexity of the patient's impairments (multi-system involvement);For patient/therapist  safety PT goals addressed during session: Mobility/safety with mobility OT goals addressed during session: ADL's and self-care      AM-PAC OT "6 Clicks" Daily Activity     Outcome Measure Help from another person eating meals?: A Little Help from another person taking care of personal grooming?: A Little Help from another person toileting, which includes using toliet, bedpan, or urinal?: A Lot Help from another person bathing (including washing, rinsing, drying)?: A Lot Help from another person to put on and taking off regular upper body clothing?: A Little Help from another person to put on and taking off regular lower body clothing?: A Lot 6 Click Score: 15   End of Session Nurse Communication: Mobility status  Activity Tolerance: Other (comment);Patient limited by pain(pt somewhat self-limiting/fearful) Patient left: in bed;with call bell/phone within reach  OT Visit Diagnosis: Unsteadiness on feet (R26.81);Muscle weakness (generalized) (M62.81);History of falling (Z91.81)                Time: 1610-9604 OT Time Calculation (min): 44 min Charges:  OT General Charges $OT Visit: 1 Visit OT Evaluation $OT Eval Moderate Complexity: 1 Mod OT Treatments $Self Care/Home Management : 8-22 mins  Gerrianne Scale, MS, OTR/L ascom [phone] 02/10/20, 11:55 AM

## 2020-02-10 NOTE — Progress Notes (Addendum)
Patient ID: Alexis Mclaughlin, female   DOB: [birth year], 74 y.o.   MRN: [id number] [facility] PROGRESS NOTE  Alexis Mclaughlin AOZ:[id number] DOB: 11-Apr-1946 DOA: 02/09/2020 PCP: Patient, No Pcp Per  HPI/Subjective: Patient feeling weak.  States she needs to rest here in the hospital.  Patient was orthostatic.  Patient states she is eating and drinking.  States she is urinating okay.  Still complains of some right shoulder pain  Objective: Vitals:   02/10/20 0416 02/10/20 0616  BP: 99/64 110/62  Pulse: 84 60  Resp: 20 20  Temp: 97.7 F (36.5 C)   SpO2: 100% 98%    Intake/Output Summary (Last 24 hours) at 02/10/2020 1459 Last data filed at 02/09/2020 1531 Gross per 24 hour  Intake 100 ml  Output --  Net 100 ml   Filed Weights   02/09/20 1341  Weight: 90.7 kg    ROS: Review of Systems  Constitutional: Negative for chills and fever.  Eyes: Negative for blurred vision.  Respiratory: Negative for cough and shortness of breath.   Cardiovascular: Negative for chest pain.  Gastrointestinal: Negative for abdominal pain, constipation, diarrhea, nausea and vomiting.  Genitourinary: Negative for dysuria.  Musculoskeletal: Positive for joint pain.  Neurological: Negative for dizziness and headaches.   Exam: Physical Exam  HENT:  Nose: No mucosal edema.  Mouth/Throat: No oropharyngeal exudate or posterior oropharyngeal edema.  Eyes: Conjunctivae and lids are normal.  Neck: Carotid bruit is not present.  Cardiovascular: S1 normal and S2 normal. Exam reveals no gallop.  No murmur heard. Respiratory: No respiratory distress. She has decreased breath sounds in the right lower field and the left lower field. She has no wheezes. She has no rhonchi. She has no rales.  GI: Soft. Bowel sounds are normal. There is no abdominal tenderness.  Musculoskeletal:     Right ankle: Swelling present.     Left ankle: Swelling present.  Lymphadenopathy:    She has no cervical adenopathy.   Neurological: She is alert. No cranial nerve deficit.  Skin: Skin is warm. No rash noted. Nails show no clubbing.  Psychiatric: She has a normal mood and affect.      Data Reviewed: Basic Metabolic Panel: Recent Labs  Lab 02/09/20 1349 02/10/20 0537  NA 132* 137  K 4.0 3.5  CL 92* 99  CO2 27 31  GLUCOSE 177* 109*  BUN 79* 65*  CREATININE 2.63* 2.12*  CALCIUM 8.7* 8.5*   Liver Function Tests: Recent Labs  Lab 02/09/20 1349  AST 37  ALT 29  ALKPHOS 76  BILITOT 1.0  PROT 6.0*  ALBUMIN 2.8*   CBC: Recent Labs  Lab 02/09/20 1349 02/10/20 0537  WBC 11.8* 10.1  HGB 11.8* 11.9*  HCT 35.3* 36.3  MCV 75.6* 77.1*  PLT 253 253   Cardiac Enzymes: Recent Labs  Lab 02/09/20 1349  CKTOTAL 270*   CBG: Recent Labs  Lab 02/09/20 1744 02/09/20 2111 02/10/20 0721 02/10/20 1124  GLUCAP 172* 97 111* 137*    Recent Results (from the past 240 hour(s))  Urine Culture     Status: Abnormal   Collection Time: 02/09/20  2:08 PM   Specimen: Urine, Clean Catch  Result Value Ref Range Status   Specimen Description   Final    URINE, CLEAN CATCH Performed at [facility], [address], [place], [place] [address]    Special Requests NONE  Final   Culture (A)  Final    <10,000 COLONIES/mL INSIGNIFICANT GROWTH Performed at [facility], [address], [place], [place] [address]    Report Status 02/10/2020 FINAL  Final  SARS CORONAVIRUS 2 (TAT 6-24 HRS) Nasopharyngeal Nasopharyngeal Swab     Status: None   Collection Time: 02/09/20  3:19 PM   Specimen: Nasopharyngeal Swab  Result Value Ref Range Status   SARS Coronavirus 2 NEGATIVE NEGATIVE Final    Comment: (NOTE) SARS-CoV-2 target nucleic acids are NOT DETECTED. The SARS-CoV-2 RNA is generally detectable in upper and lower respiratory specimens during the acute phase of infection. Negative results do not preclude SARS-CoV-2 infection, do not rule out co-infections with other pathogens, and should  not be used as the sole basis for treatment or other patient management decisions. Negative results must be combined with clinical observations, patient history, and epidemiological information. The expected result is Negative. Fact Sheet for Patients: SugarRoll.be Fact Sheet for Healthcare Providers: https://www.woods-mathews.com/ This test is not yet approved or cleared by the [place] FDA and  has been authorized for detection and/or diagnosis of SARS-CoV-2 by FDA under an Emergency Use Authorization (EUA). This EUA will remain  in effect (meaning this test can be used) for the duration of the COVID-19 declaration under Section 56 4(b)(1) of the Act, 21 U.S.C. section 360bbb-3(b)(1), unless the authorization is terminated or revoked sooner. Performed at [facility], [place] [address], [place],  [address]      Studies: DG Shoulder Right Port  Result Date: 02/09/2020 CLINICAL DATA:  74 year old female with persistent right arm pain for 1 month. EXAM: PORTABLE RIGHT SHOULDER COMPARISON:  None. FINDINGS: Glenohumeral joint space loss and bulky degenerative spurring. No glenohumeral joint dislocation. Proximal right humerus intact. Right clavicle and scapula appear intact. Negative visible right ribs and chest. IMPRESSION: Glenohumeral joint osteoarthritis.  No acute radiographic findings. Electronically Signed   By: Genevie Ann M.D.   On: 02/09/2020 16:22    Scheduled Meds: . aspirin EC  81 mg Oral Daily  . DULoxetine  60 mg Oral Daily  . heparin  5,000 Units Subcutaneous Q8H  . insulin aspart  0-5 Units Subcutaneous QHS  . insulin aspart  0-9 Units Subcutaneous TID WC  . levothyroxine  50 mcg Oral QAC breakfast   Continuous Infusions: . sodium chloride 75 mL/hr at 02/10/20 0558    Assessment/Plan:  1. Acute kidney injury.  With IV fluids creatinine has improved from 2.63 on admission down to 2.12.  Her baseline back in  September 2020 was 0.85.  Continue IV fluid hydration.  Hold antihypertensive medications and discontinue Bactrim. 2. Orthostatic hypotension.  Continue IV fluids.  Continue to hold atenolol and Hyzaar. 3. Acute cystitis without hematuria with leukocytosis.  Urine culture growing only less than 10,000 organisms.  Discontinue Rocephin. 4. Right shoulder pain secondary to osteoarthritis.  OT evaluation 5. Hypothyroidism unspecified on levothyroxine 6. Weakness.  Physical therapy recommended rehab versus home with supervision.  Transitional care team consultation.  Patient does belong with the [facility] program. 7. Hyperlipidemia unspecified.  Continue hold statin at this time 8. Type 2 diabetes mellitus with hyperlipidemia.  Hemoglobin A1c elevated at 7.4.  Patient on sliding scale only.  Code Status:     Code Status Orders  (From admission, onward)         Start     Ordered   02/09/20 1529  Do not attempt resuscitation (DNR)  Continuous    Question Answer Comment  In the event of cardiac or respiratory ARREST Do not call a "code blue"   In  the event of cardiac or respiratory ARREST Do not perform Intubation, CPR, defibrillation or ACLS   In the event of cardiac or respiratory ARREST Use medication by any route, position, wound care, and other measures to relive pain and suffering. May use oxygen, suction and manual treatment of airway obstruction as needed for comfort.   Comments nurse may pronounce      02/09/20 1529        Code Status History    This patient has a current code status but no historical code status.   Advance Care Planning Activity    Advance Directive Documentation     Most Recent Value  Type of Advance Directive  Out of facility DNR (pink MOST or yellow form)  Pre-existing out of facility DNR order (yellow form or pink MOST form)  Pink MOST form placed in chart (order not valid for inpatient use)  "MOST" Form in Place?  --     Family Communication: Spoke with  daughter on the phone Disposition Plan: Patient will have to have a better blood pressure not be orthostatic prior to disposition.  Time spent: 27 minutes  Katherine

## 2020-02-10 NOTE — Progress Notes (Signed)
Report called to [facility] RN. Patient to be transferred to room.

## 2020-02-11 ENCOUNTER — Inpatient Hospital Stay: Payer: Medicare (Managed Care)

## 2020-02-11 LAB — BASIC METABOLIC PANEL
Anion gap: 10 (ref 5–15)
BUN: 48 mg/dL — ABNORMAL HIGH (ref 8–23)
CO2: 26 mmol/L (ref 22–32)
Calcium: 8.1 mg/dL — ABNORMAL LOW (ref 8.9–10.3)
Chloride: 104 mmol/L (ref 98–111)
Creatinine, Ser: 2.01 mg/dL — ABNORMAL HIGH (ref 0.44–1.00)
GFR calc Af Amer: 28 mL/min — ABNORMAL LOW (ref >60)
GFR calc non Af Amer: 24 mL/min — ABNORMAL LOW (ref >60)
Glucose, Bld: 145 mg/dL — ABNORMAL HIGH (ref 70–99)
Potassium: 3.4 mmol/L — ABNORMAL LOW (ref 3.5–5.1)
Sodium: 140 mmol/L (ref 135–145)

## 2020-02-11 LAB — GLUCOSE, CAPILLARY
Glucose-Capillary: 100 mg/dL — ABNORMAL HIGH (ref 70–99)
Glucose-Capillary: 151 mg/dL — ABNORMAL HIGH (ref 70–99)
Glucose-Capillary: 131 mg/dL — ABNORMAL HIGH (ref 70–99)
Glucose-Capillary: 167 mg/dL — ABNORMAL HIGH (ref 70–99)

## 2020-02-11 MED ORDER — ENSURE ENLIVE PO LIQD
237.0000 mL | Freq: Two times a day (BID) | ORAL | Status: DC
  Administered 2020-02-11 – 2020-02-12 (×4): 237 mL via ORAL

## 2020-02-11 MED ORDER — MIDODRINE HCL 5 MG PO TABS
5.0000 mg | ORAL_TABLET | Freq: Three times a day (TID) | ORAL | Status: DC
  Administered 2020-02-11 – 2020-02-14 (×9): 5 mg via ORAL
  Filled 2020-02-11 (×10): qty 1

## 2020-02-11 MED ORDER — MIDODRINE HCL 5 MG PO TABS: 5.0000 mg | ORAL_TABLET | Freq: Three times a day (TID) | ORAL | Status: DC

## 2020-02-11 MED ORDER — ADULT MULTIVITAMIN W/MINERALS CH
1.0000 | ORAL_TABLET | Freq: Every day | ORAL | Status: DC
  Administered 2020-02-12 – 2020-02-14 (×3): 1 via ORAL
  Filled 2020-02-11 (×3): qty 1

## 2020-02-11 MED ORDER — POTASSIUM CHLORIDE 20 MEQ PO PACK
20.0000 meq | PACK | Freq: Once | ORAL | Status: AC
  Administered 2020-02-11: 20 meq via ORAL
  Filled 2020-02-11: qty 1

## 2020-02-11 MED ORDER — MIDODRINE HCL 5 MG PO TABS
2.5000 mg | ORAL_TABLET | Freq: Three times a day (TID) | ORAL | Status: DC
  Administered 2020-02-11: 2.5 mg via ORAL
  Filled 2020-02-11 (×2): qty 0.5

## 2020-02-11 NOTE — Progress Notes (Signed)
Orthostatic vitals could could not be completed. BP laying 106/61, HR 62, BP sitting 119/72, HR 68. When standing the patient was going to faint and legs gave out and has to be sitted back down on the side of the bed for recovery.

## 2020-02-11 NOTE — Progress Notes (Signed)
Physical Therapy Treatment Patient Details Name: Alexis Mclaughlin MRN: [id number] DOB: 1946/06/10 Today's Date: 02/11/2020    History of Present Illness Alexis Mclaughlin is a 46yoF who comes to [facility] on 02/09[facility]20 after found on floor unable to get up. PMH: DM2, HTN. Pt is a [facility] participant. Pt admitted for UTI. Pt denies any injury from fall, but has chronic shoulder pain. Initially with soft BP. Pt has had orthostatic BP issues 2/18, 2/19.    PT Comments    Pt in bed upon entry watching Lorra Hals, grandDTR in room. Pt reports to recall author from evaluation visit in ED previous day. RN reports dizziness and BLE buckling earlier while attempting to establish orthos. Author teaches pt various bed level exercises to maximize leg strength, avoid decline, decreased stiffness from immobility. Pt has no difficulty following instructions for performance. Able to perform all exercises as directed, often performing more reps than asked. Most exercises transition to floppy, careless movement after abut 10 reps in attempts to appease author. Pt reports her participation is finite and plans to cease after just 3 more exercises. Pt interactive throughout but interaction dominated by rich bombast, contempt, and colorful language. Pt reminded that Pryor Curia is present to serve her goals and needs, to enable her ability to return to activities that she desires to do. Author makes know that the removal of his presence is at her discretion at any time. Pt responds in short by explaining 'you know I don't mean what I say.' Pt participatory but not in a manor that is convincing of her concern or seriousness. Pt made comfortable at end of session, she makes needs made known when cued. Will continue to follow.     Follow Up Recommendations  SNF;Supervision for mobility/OOB;Supervision - Intermittent     Equipment Recommendations  None recommended by PT    Recommendations for Other Services       Precautions /  Restrictions Precautions Precautions: Fall Restrictions Other Position/Activity Restrictions: *chronic Rt shoulder pain    Mobility  Bed Mobility Overal bed mobility: Needs Assistance             General bed mobility comments: scooting up in bed, +2 mod-maxA  Transfers                 General transfer comment: deferred, pt minimally cooperative, orthostatic earlier.  Ambulation/Gait                 Stairs             Wheelchair Mobility    Modified Rankin (Stroke Patients Only)       Balance                                            Cognition Arousal/Alertness: Awake/alert Behavior During Therapy: WFL for tasks assessed/performed Overall Cognitive Status: History of cognitive impairments - at baseline                                 General Comments: GDTR in session, surprised by bombast and contempt directed toward author during session, but does not indicate any author mentation changes.      Exercises Total Joint Exercises Bridges: AROM;Both;15 reps;Supine General Exercises - Lower Extremity Short Arc Quad: AROM;Both;20 reps;Supine Heel Slides: AROM;Both;15 reps;Supine Hip ABduction/ADduction: AROM;Both;15 reps;Supine Hip  Flexion/Marching: AROM;20 reps;Supine;Both    General Comments        Pertinent Vitals/Pain Pain Assessment: (does not rate, but blames her sour disposition on chronic Rt shoulder pain) Pain Intervention(s): Limited activity within patient's tolerance;Repositioned    Home Living                      Prior Function            PT Goals (current goals can now be found in the care plan section) Acute Rehab PT Goals Patient Stated Goal: regain her baseline independence in mobility PT Goal Formulation: With patient Time For Goal Achievement: 02/24/20 Potential to Achieve Goals: Fair Progress towards PT goals: Progressing toward goals    Frequency    Min  2X/week      PT Plan Current plan remains appropriate    Co-evaluation              AM-PAC PT "6 Clicks" Mobility   Outcome Measure  Help needed turning from your back to your side while in a flat bed without using bedrails?: A Lot Help needed moving from lying on your back to sitting on the side of a flat bed without using bedrails?: A Lot Help needed moving to and from a bed to a chair (including a wheelchair)?: Total Help needed standing up from a chair using your arms (e.g., wheelchair or bedside chair)?: Total Help needed to walk in hospital room?: Total Help needed climbing 3-5 steps with a railing? : Total 6 Click Score: 8    End of Session Equipment Utilized During Treatment: Gait belt Activity Tolerance: Patient tolerated treatment well;No increased pain Patient left: in bed;with call bell/phone within reach;with family/visitor present Nurse Communication: Mobility status PT Visit Diagnosis: Unsteadiness on feet (R26.81);Other abnormalities of gait and mobility (R26.89);Repeated falls (R29.6);Muscle weakness (generalized) (M62.81);History of falling (Z91.81);Difficulty in walking, not elsewhere classified (R26.2)     Time: 3220-2542 PT Time Calculation (min) (ACUTE ONLY): 12 min  Charges:  $Therapeutic Exercise: 8-22 mins                     3:30 PM, 02/11/20 Etta Grandchild, PT, DPT Physical Therapist - [facility]  [phone] ([place])     Tamber Burtch C 02/11/2020, 3:24 PM

## 2020-02-11 NOTE — Care Management Important Message (Signed)
Important Message  Patient Details  Name: Alexis Mclaughlin MRN: [id number] Date of Birth: 1946-07-06   Medicare Important Message Given:  Yes  Initial Medicare IM given by Patient Access Associate on 02/11/2020 at 8:17am.     Dannette Barbara 02/11/2020, 10:20 AM

## 2020-02-11 NOTE — NC FL2 (Signed)
[place] LEVEL OF CARE SCREENING TOOL     IDENTIFICATION  Patient Name: Alexis Mclaughlin Birthdate: 1946-11-20 Sex: female Admission Date (Current Location): 02/09/2020  [facility] and [place] Number:  Engineering geologist and Address:         Provider Number: [id number]  Attending Physician Name and Address:  Loletha Grayer, MD  Relative Name and Phone Number:       Current Level of Care: Hospital Recommended Level of Care: [place] Prior Approval Number:    Date Approved/Denied:   PASRR Number: [id number]  Discharge Plan: Home    Current Diagnoses: Patient Active Problem List   Diagnosis Date Noted  . Acute kidney injury ([place]) 02/10/2020  . AKI (acute kidney injury) ([place]) 02/09/2020  . Hypotension   . Acute cystitis without hematuria   . Chronic right shoulder pain   . Hypothyroidism   . Weakness   . Type 2 diabetes mellitus with hyperlipidemia (HCC)     Orientation RESPIRATION BLADDER Height & Weight     Self    Incontinent, External catheter Weight: 90.7 kg Height:  5\' 3"  (160 cm)  BEHAVIORAL SYMPTOMS/MOOD NEUROLOGICAL BOWEL NUTRITION STATUS      Continent Diet  AMBULATORY STATUS COMMUNICATION OF NEEDS Skin   Extensive Assist Verbally Normal                       Personal Care Assistance Level of Assistance              Functional Limitations Info             SPECIAL CARE FACTORS FREQUENCY  PT (By licensed PT), OT (By licensed OT)                    Contractures Contractures Info: Not present    Additional Factors Info  Code Status, Allergies Code Status Info: DNR Allergies Info: NKDA           Current Medications (02/11/2020):  This is the current hospital active medication list Current Facility-Administered Medications  Medication Dose Route Frequency Provider Last Rate Last Admin  . 0.9 %  sodium chloride infusion   Intravenous Continuous Loletha Grayer, MD 75 mL/hr at 02/11/20 0024  Rate Verify at 02/11/20 0024  . acetaminophen (TYLENOL) tablet 650 mg  650 mg Oral Q6H PRN Loletha Grayer, MD   650 mg at 02/11/20 0941   Or  . acetaminophen (TYLENOL) suppository 650 mg  650 mg Rectal Q6H PRN Loletha Grayer, MD      . aspirin EC tablet 81 mg  81 mg Oral Daily Loletha Grayer, MD   81 mg at 02/11/20 0941  . DULoxetine (CYMBALTA) DR capsule 60 mg  60 mg Oral Daily Loletha Grayer, MD   60 mg at 02/11/20 0941  . feeding supplement (ENSURE ENLIVE) (ENSURE ENLIVE) liquid 237 mL  237 mL Oral BID BM Loletha Grayer, MD   237 mL at 02/11/20 1200  . heparin injection 5,000 Units  5,000 Units Subcutaneous Q8H Loletha Grayer, MD   5,000 Units at 02/11/20 1219  . insulin aspart (novoLOG) injection 0-5 Units  0-5 Units Subcutaneous QHS Wieting, Richard, MD      . insulin aspart (novoLOG) injection 0-9 Units  0-9 Units Subcutaneous TID WC Loletha Grayer, MD   2 Units at 02/11/20 1216  . levothyroxine (SYNTHROID) tablet 50 mcg  50 mcg Oral QAC breakfast Loletha Grayer, MD   50 mcg at 02/11/20 0504  .  midodrine (PROAMATINE) tablet 2.5 mg  2.5 mg Oral TID WC Loletha Grayer, MD   2.5 mg at 02/11/20 1219  . [START ON 02/12/2020] multivitamin with minerals tablet 1 tablet  1 tablet Oral Daily Wieting, Richard, MD      . ondansetron [facility]) tablet 4 mg  4 mg Oral Q6H PRN Wieting, Richard, MD       Or  . ondansetron (ZOFRAN) injection 4 mg  4 mg Intravenous Q6H PRN Loletha Grayer, MD         Discharge Medications: Please see discharge summary for a list of discharge medications.  Relevant Imaging Results:  Relevant Lab Results:   Additional Information ss [id number]  Beverly Sessions, RN

## 2020-02-11 NOTE — TOC Initial Note (Signed)
Transition of Care [facility]) - Initial/Assessment Note    Patient Details  Name: Alexis Mclaughlin MRN: [id number] Date of Birth: 1946/09/15  Transition of [facility]) CM/SW Contact:    Beverly Sessions, RN Phone Number: 02/11/2020, 3:10 PM  Clinical Narrative:                 Patient admitted from home with AKI  Patient lives at home alone.  Followed by [facility] Grandaughter at bed side  PT has assessed patient and recommends SNF.  Presented option to patient, she did not respond.  RNCM reached out to daughter via phone.  She is in agreement tot bed search.  She states "it's what has to happen"  RNCM spoke with Kennyth Lose at [facility].  They are in agreement to SNF work up.  She states that the contracted facilities are [facility], [facility], and [facility]  PASRR obtained [facility] sent for signature, and bed search initiated to contracted facilities.  Kennyth Lose at [facility] updated   If [facility] needs to be contacted over the weekend please call [phone]  Expected Discharge Plan: [place]     Patient Goals and CMS Choice        Expected Discharge Plan and Services Expected Discharge Plan: [place]       Living arrangements for the past 2 months: Apartment                                      Prior Living Arrangements/Services Living arrangements for the past 2 months: Apartment Lives with:: Self              Current home services: DME, Other (comment)(PCS)    Activities of Daily Living Home Assistive Devices/Equipment: Environmental consultant (specify type) ADL Screening (condition at time of admission) Patient's cognitive ability adequate to safely complete daily activities?: Yes Is the patient deaf or have difficulty hearing?: No Does the patient have difficulty seeing, even when wearing glasses/contacts?: No Does the patient have difficulty concentrating, remembering, or making decisions?: Yes Patient able to express need for assistance with ADLs?:  Yes Does the patient have difficulty dressing or bathing?: Yes Independently performs ADLs?: Yes (appropriate for developmental age) Does the patient have difficulty walking or climbing stairs?: Yes Weakness of Legs: Both Weakness of Arms/Hands: Both  Permission Sought/Granted                  Emotional Assessment       Orientation: : Oriented to Self   Psych Involvement: No (comment)  Admission diagnosis:  Lower urinary tract infectious disease [N39.0] Weakness [R53.1] Pain [R52] Acute renal insufficiency [N28.9] Acute kidney injury ([place]) [N17.9] Patient Active Problem List   Diagnosis Date Noted  . Acute kidney injury ([place]) 02/10/2020  . AKI (acute kidney injury) ([place]) 02/09/2020  . Hypotension   . Acute cystitis without hematuria   . Chronic right shoulder pain   . Hypothyroidism   . Weakness   . Type 2 diabetes mellitus with hyperlipidemia [facility])    PCP:  Patient, No Pcp Per Pharmacy:   [facility] [address] [place] [address] Phone: [phone] Fax: [phone]     Social Determinants of Health (SDOH) Interventions    Readmission Risk Interventions No flowsheet data found.

## 2020-02-11 NOTE — Progress Notes (Signed)
Initial Nutrition Assessment  DOCUMENTATION CODES:   Obesity unspecified  INTERVENTION:   Ensure Enlive po BID, each supplement provides 350 kcal and 20 grams of protein  MVI daily   NUTRITION DIAGNOSIS:   Inadequate oral intake related to acute illness as evidenced by per patient/family report.  GOAL:   Patient will meet greater than or equal to 90% of their needs  MONITOR:   PO intake, Supplement acceptance, Labs, Weight trends, Skin, I & O's  REASON FOR ASSESSMENT:   Malnutrition Screening Tool    ASSESSMENT:   74 y/o female with h/o HTN and DM who is admitted with UTI.   Nutrition related history obtained from pt's granddaughter who is at bedside. Granddaughter reports pt with fairly good appetite and oral intake at baseline but reports that her appetite has been decreased for the past 2 weeks. Pt has been drinking Boost at home and she loves the strawberry and peach flavors. Granddaughter reports that pt has trouble swallowing and that this has been getting worse lately. Pt does better with mashed potatoes, applesauce and other pureed foods per granddaughter report. Pt is edentulous. SLP evaluation pending. RD will change pt over to a dysphagia 2 diet until pt can be evaluated. RD will also add Ensure supplements and daily MVI. There is no weight history in pt's chart to determine if any significant weight loss but granddaughter reports that pt is weight stable.   Medications reviewed and include: aspirin, heparin, insulin, synthroid, KCl, NaCl @75ml /hr  Labs reviewed: K 3.4(L), BUN 48(H), creat 2.01(H) cbgs- 111, 137, 100, 100 x 24 hrs AIC 7.4(H)- 2/17  NUTRITION - FOCUSED PHYSICAL EXAM:    Most Recent Value  Orbital Region  No depletion  Upper Arm Region  No depletion  Thoracic and Lumbar Region  No depletion  Buccal Region  No depletion  Temple Region  No depletion  Clavicle Bone Region  No depletion  Clavicle and Acromion Bone Region  No depletion  Scapular  Bone Region  No depletion  Dorsal Hand  No depletion  Patellar Region  No depletion  Anterior Thigh Region  No depletion  Posterior Calf Region  No depletion  Edema (RD Assessment)  Mild  Hair  Reviewed  Eyes  Reviewed  Mouth  Reviewed  Skin  Reviewed  Nails  Reviewed     Diet Order:   Diet Order            DIET DYS 2 Room service appropriate? Yes; Fluid consistency: Thin  Diet effective now             EDUCATION NEEDS:   Education needs have been addressed(with pt's grandaughter)  Skin:  Skin Assessment: Reviewed RN Assessment  Last BM:  2/16  Height:   Ht Readings from Last 1 Encounters:  02/09/20 5\' 3"  (1.6 m)    Weight:   Wt Readings from Last 1 Encounters:  02/09/20 90.7 kg    Ideal Body Weight:  52.3 kg  BMI:  Body mass index is 35.43 kg/m.  Estimated Nutritional Needs:   Kcal:  1700-2000kcal/day  Protein:  85-100g/day  Fluid:  1.5L/day  Koleen Distance MS, RD, LDN Contact information available in Amion

## 2020-02-11 NOTE — Progress Notes (Signed)
Patient ID: Alexis Mclaughlin, female   DOB: 01/20/1946, 74 y.o.   MRN: [id number] [facility] PROGRESS NOTE  Alexis Mclaughlin JKD:[id number] DOB: [birth year] DOA: 02/09/2020 PCP: Patient, No Pcp Per  HPI/Subjective: Patient states he feels okay.  As per nursing staff when they tried to stand her up she felt like she was going to faint and needed to sit back down again.  They were unable to check orthostatics today secondary to that.  Objective: Vitals:   02/10/20 2221 02/11/20 0315  BP: (!) 100/50 117/69  Pulse: 63 66  Resp: 16 20  Temp: 98.4 F (36.9 C) (!) 97 F (36.1 C)  SpO2: 95% 100%    Intake/Output Summary (Last 24 hours) at 02/11/2020 1334 Last data filed at 02/11/2020 0324 Gross per 24 hour  Intake 1612.1 ml  Output 600 ml  Net 1012.1 ml   Filed Weights   02/09/20 1341  Weight: 90.7 kg    ROS: Review of Systems  Constitutional: Negative for chills and fever.  Eyes: Negative for blurred vision.  Respiratory: Negative for cough and shortness of breath.   Cardiovascular: Negative for chest pain.  Gastrointestinal: Negative for abdominal pain, constipation, diarrhea, nausea and vomiting.  Genitourinary: Negative for dysuria.  Musculoskeletal: Positive for joint pain.  Neurological: Negative for dizziness and headaches.   Exam: Physical Exam  HENT:  Nose: No mucosal edema.  Mouth/Throat: No oropharyngeal exudate or posterior oropharyngeal edema.  Eyes: Conjunctivae and lids are normal.  Neck: Carotid bruit is not present.  Cardiovascular: S1 normal and S2 normal. Exam reveals no gallop.  No murmur heard. Respiratory: No respiratory distress. She has decreased breath sounds in the right lower field and the left lower field. She has no wheezes. She has no rhonchi. She has no rales.  GI: Soft. Bowel sounds are normal. There is no abdominal tenderness.  Musculoskeletal:     Right ankle: Swelling present.     Left ankle: Swelling present.  Lymphadenopathy:    She  has no cervical adenopathy.  Neurological: She is alert. No cranial nerve deficit.  Skin: Skin is warm. No rash noted. Nails show no clubbing.  Psychiatric: She has a normal mood and affect.      Data Reviewed: Basic Metabolic Panel: Recent Labs  Lab 02/09/20 1349 02/10/20 0537 02/11/20 0908  NA 132* 137 140  K 4.0 3.5 3.4*  CL 92* 99 104  CO2 27 31 26   GLUCOSE 177* 109* 145*  BUN 79* 65* 48*  CREATININE 2.63* 2.12* 2.01*  CALCIUM 8.7* 8.5* 8.1*   Liver Function Tests: Recent Labs  Lab 02/09/20 1349  AST 37  ALT 29  ALKPHOS 76  BILITOT 1.0  PROT 6.0*  ALBUMIN 2.8*   CBC: Recent Labs  Lab 02/09/20 1349 02/10/20 0537  WBC 11.8* 10.1  HGB 11.8* 11.9*  HCT 35.3* 36.3  MCV 75.6* 77.1*  PLT 253 253   Cardiac Enzymes: Recent Labs  Lab 02/09/20 1349  CKTOTAL 270*   CBG: Recent Labs  Lab 02/10/20 0721 02/10/20 1124 02/10/20 1641 02/11/20 0736 02/11/20 1135  GLUCAP 111* 137* 100* 100* 151*    Recent Results (from the past 240 hour(s))  Urine Culture     Status: Abnormal   Collection Time: 02/09/20  2:08 PM   Specimen: Urine, Clean Catch  Result Value Ref Range Status   Specimen Description   Final    URINE, CLEAN CATCH Performed at [facility], [address] [place], [place] [address]  Special Requests NONE  Final   Culture (A)  Final    <10,000 COLONIES/mL INSIGNIFICANT GROWTH Performed at [facility], [place] [address], [place], [place] [address]    Report Status 02/10/2020 FINAL  Final  SARS CORONAVIRUS 2 (TAT 6-24 HRS) Nasopharyngeal Nasopharyngeal Swab     Status: None   Collection Time: 02/09/20  3:19 PM   Specimen: Nasopharyngeal Swab  Result Value Ref Range Status   SARS Coronavirus 2 NEGATIVE NEGATIVE Final    Comment: (NOTE) SARS-CoV-2 target nucleic acids are NOT DETECTED. The SARS-CoV-2 RNA is generally detectable in upper and lower respiratory specimens during the acute phase of infection. Negative results do  not preclude SARS-CoV-2 infection, do not rule out co-infections with other pathogens, and should not be used as the sole basis for treatment or other patient management decisions. Negative results must be combined with clinical observations, patient history, and epidemiological information. The expected result is Negative. Fact Sheet for Patients: SugarRoll.be Fact Sheet for Healthcare Providers: https://www.woods-mathews.com/ This test is not yet approved or cleared by the [place] FDA and  has been authorized for detection and/or diagnosis of SARS-CoV-2 by FDA under an Emergency Use Authorization (EUA). This EUA will remain  in effect (meaning this test can be used) for the duration of the COVID-19 declaration under Section 56 4(b)(1) of the Act, 21 U.S.C. section 360bbb-3(b)(1), unless the authorization is terminated or revoked sooner. Performed at [facility], [place] [address] [place], [place] [address]      Studies: DG Shoulder Right Port  Result Date: 02/09/2020 CLINICAL DATA:  74 year old female with persistent right arm pain for 1 month. EXAM: PORTABLE RIGHT SHOULDER COMPARISON:  None. FINDINGS: Glenohumeral joint space loss and bulky degenerative spurring. No glenohumeral joint dislocation. Proximal right humerus intact. Right clavicle and scapula appear intact. Negative visible right ribs and chest. IMPRESSION: Glenohumeral joint osteoarthritis.  No acute radiographic findings. Electronically Signed   By: Genevie Ann M.D.   On: 02/09/2020 16:22    Scheduled Meds: . aspirin EC  81 mg Oral Daily  . DULoxetine  60 mg Oral Daily  . feeding supplement (ENSURE ENLIVE)  237 mL Oral BID BM  . heparin  5,000 Units Subcutaneous Q8H  . insulin aspart  0-5 Units Subcutaneous QHS  . insulin aspart  0-9 Units Subcutaneous TID WC  . levothyroxine  50 mcg Oral QAC breakfast  . midodrine  2.5 mg Oral TID WC  . [START ON 02/12/2020]  multivitamin with minerals  1 tablet Oral Daily   Continuous Infusions: . sodium chloride 75 mL/hr at 02/11/20 0024    Assessment/Plan:  1. Acute kidney injury.  With IV fluids creatinine has improved from 2.63 on admission down to 2.01.  Her baseline back in September 2020 was 0.85.  Continue IV fluid hydration.  Hold antihypertensive medications and discontinue Bactrim.  Was hoping for better improvement of creatinine.  Check BMP tomorrow morning.  Check a renal ultrasound. 2. Orthostatic hypotension.  Continue IV fluids.  Continue to hold atenolol and Hyzaar.  Start low-dose midodrine 3. Acute cystitis without hematuria with leukocytosis.  Urine culture growing only less than 10,000 organisms.  Antibiotics discontinued 4. Right shoulder pain secondary to osteoarthritis.  OT evaluation 5. Hypothyroidism unspecified on levothyroxine 6. Weakness.  Physical therapy recommended rehab.  [facility] program physician looking into rehabs for patient. 7. Hyperlipidemia unspecified.  Continue hold statin at this time 8. Type 2 diabetes mellitus with hyperlipidemia.  Hemoglobin A1c elevated at  7.4.  Patient on sliding scale only. 9. Physician concerned about her swallowing status.  Speech therapy evaluation ordered.  Code Status:     Code Status Orders  (From admission, onward)         Start     Ordered   02/09/20 1529  Do not attempt resuscitation (DNR)  Continuous    Question Answer Comment  In the event of cardiac or respiratory ARREST Do not call a "code blue"   In the event of cardiac or respiratory ARREST Do not perform Intubation, CPR, defibrillation or ACLS   In the event of cardiac or respiratory ARREST Use medication by any route, position, wound care, and other measures to relive pain and suffering. May use oxygen, suction and manual treatment of airway obstruction as needed for comfort.   Comments nurse may pronounce      02/09/20 1529        Code Status History    This patient  has a current code status but no historical code status.   Advance Care Planning Activity    Advance Directive Documentation     Most Recent Value  Type of Advance Directive  Out of facility DNR (pink MOST or yellow form)  Pre-existing out of facility DNR order (yellow form or pink MOST form)  Pink MOST form placed in chart (order not valid for inpatient use)  "MOST" Form in Place?  --     Family Communication: Spoke with granddaughter at the bedside. Disposition Plan: [facility] program physician will work with transitional care team in order to find a rehab bed for the patient.  Patient will still need to be able to stand up without becoming orthostatic prior to discharge.  Time spent: 27 minutes  [place]

## 2020-02-11 NOTE — Evaluation (Signed)
Clinical/Bedside Swallow Evaluation Patient Details  Name: Alexis Mclaughlin MRN: [id number] Date of Birth: [birth year]  Today's Date: 02/11/2020 Time: SLP Start Time (ACUTE ONLY): 1250 SLP Stop Time (ACUTE ONLY): 1345 SLP Time Calculation (min) (ACUTE ONLY): 55 min  Past Medical History:  Past Medical History:  Diagnosis Date  . Diabetes mellitus without complication ([place])   . Hypertension   . Hypothyroidism    Past Surgical History:  Past Surgical History:  Procedure Laterality Date  . BREAST BIOPSY Right 09/02/2018   affirm stereo/path pending   HPI:  Pt is a 74 y.o. female who presents to the emergency department for possible urinary tract infection.   Patient is coming from [facility]; has [facility]ACE program following.  Per EMS, was diagnosed with urinary tract infection.  Home Health was at the patient's home going to give her an antibiotic but the family stated patient had been somewhat uncooperative recently and they wanted her sent to the emergency department for further evaluation.  Currently the patient is awake, alert, no acute distress.  Patient initially reluctant to lab work but is ultimately agreeable.  Granddaughter present stated pt "has Dementia" when asked.    Assessment / Plan / Recommendation Clinical Impression  Pt appears to present w/ adequate oropharyngeal phase swallow function w/ no neuromuscular deficits noted during oral intake/swallowing of trials pt accepted. Pt appears at reduced risk for aspiration when following general aspiration precautions. Of noted impact was pt's Cognitive decline --- pt's Granddaughter present w/ pt stated pt "has Dementia". Any Cognitive decline can impact the Oral phase of swallowing and acceptance of certain food/liquid intake especially during Acute illness such as UTI.  During this assessment, pt accepted a few trials of po's, mostly the sweet Ensure supplement drink. No overt clinical s/s of aspiration noted w/ trials of thin liquids  via Straw, purees; no decline in vocal quality or respiratory effort during/post trials. Oral phase bolus management of these consistencies was appropriate w/ timely A-P transfer and oral clearing. However, w/ trials of Soft, Cut foods, pt exhibited increased oral phase time for mastication/mashing of boluses then the verbalization "I don't want that" and expectorated the trials. Pt could not be encouraged to try other trials afterward. Suspect pt's Cognitive decline in directly impacting oral phase management of some increased textured foods and acceptance of po's. OM exam was brief; no unilateral weakness noted, speech clear. Pt fed self w/ tray setup.  Recommend a Pureed diet consistency w/ the agreement and request of the Granddaughter present for easier oral phase time/eating; Thin liquids. Encouraged the family to give trials of some increased texture foods at home when pt is calmer and more engaging/accepting. Recommend general aspiration precautions; support and monitoring at meals. Recommend Pills Crushed in Puree such as ice cream for easier oral intake and clearing overall -- may consider other forms of Pills such as liquid also. No further skilled ST services indicated at this time as pt appears at her Baseline. NSG to reconsult if new swallowing issues arise. Discussed the above and certain food options/preparation w/ Granddaughter as well as education on the impact of Dementia w/ eating/drinking. Recommend Dietician f/u for support.  SLP Visit Diagnosis: Dysphagia, oral phase (R13.11)(Cognitive decline baseline)    Aspiration Risk  Risk for inadequate nutrition/hydration    Diet Recommendation     Medication Administration: Crushed with puree(for easier, safer swallowing and oral clearing)    Other  Recommendations Recommended Consults: (Dietician f/u) Oral Care Recommendations: Oral care BID;Oral care before  and after PO;Staff/trained caregiver to provide oral care Other Recommendations:  (n/a)   Follow up Recommendations None      Frequency and Duration (n/a)  (n/a)       Prognosis Prognosis for Safe Diet Advancement: Fair Barriers to Reach Goals: Cognitive deficits;Time post onset;Severity of deficits;Motivation;Behavior      Swallow Study   General Date of Onset: 02/09/20 HPI: Pt is a 74 y.o. female who presents to the emergency department for possible urinary tract infection.   Patient is coming from [facility]; has PACE program following.  Per EMS, was diagnosed with urinary tract infection.  Home Health was at the patient's home going to give her an antibiotic but the family stated patient had been somewhat uncooperative recently and they wanted her sent to the emergency department for further evaluation.  Currently the patient is awake, alert, no acute distress.  Patient initially reluctant to lab work but is ultimately agreeable.  Granddaughter present stated pt "has Dementia" when asked.  Type of Study: Bedside Swallow Evaluation Previous Swallow Assessment: none Diet Prior to this Study: Regular;Thin liquids(changed to a Minced foods diet) Temperature Spikes Noted: No(wbc 10.1) Respiratory Status: Room air History of Recent Intubation: No Behavior/Cognition: Alert;Cooperative;Confused;Distractible;Requires cueing(Needed encouragement) Oral Care Completed by SLP: Recent completion by staff Oral Cavity - Dentition: Missing dentition Vision: Functional for self-feeding Self-Feeding Abilities: Able to feed self;Needs assist;Needs set up Patient Positioning: Upright in bed(needed positioning but did not want to) Baseline Vocal Quality: Normal Volitional Cough: Cognitively unable to elicit Volitional Swallow: Unable to elicit    Oral/Motor/Sensory Function Overall Oral Motor/Sensory Function: Within functional limits(w/ bolus management and speech)   Ice Chips Ice chips: Not tested   Thin Liquid Thin Liquid: Within functional limits Presentation: Self  Fed;Straw(~4+ ozs)    Nectar Thick Nectar Thick Liquid: Not tested   Honey Thick Honey Thick Liquid: Not tested   Puree Puree: Within functional limits Presentation: Self Fed;Spoon(4 trials)   Solid     Solid: Impaired Presentation: Spoon;Self Fed(2 trials) Oral Phase Impairments: Impaired mastication(expectorated boluses) Oral Phase Functional Implications: Impaired mastication;Prolonged oral transit Pharyngeal Phase Impairments: (None)       Orinda Kenner, MS, CCC-SLP , 02/11/2020,3:36 PM

## 2020-02-12 LAB — CBC
HCT: 32.9 % — ABNORMAL LOW (ref 36.0–46.0)
Hemoglobin: 10.3 g/dL — ABNORMAL LOW (ref 12.0–15.0)
MCH: 24.8 pg — ABNORMAL LOW (ref 26.0–34.0)
MCHC: 31.3 g/dL (ref 30.0–36.0)
MCV: 79.3 fL — ABNORMAL LOW (ref 80.0–100.0)
Platelets: 252 10*3/uL (ref 150–400)
RBC: 4.15 MIL/uL (ref 3.87–5.11)
RDW: 17.8 % — ABNORMAL HIGH (ref 11.5–15.5)
WBC: 8 10*3/uL (ref 4.0–10.5)
nRBC: 0 % (ref 0.0–0.2)

## 2020-02-12 LAB — BASIC METABOLIC PANEL
Anion gap: 14 (ref 5–15)
BUN: 47 mg/dL — ABNORMAL HIGH (ref 8–23)
CO2: 25 mmol/L (ref 22–32)
Calcium: 8.3 mg/dL — ABNORMAL LOW (ref 8.9–10.3)
Chloride: 103 mmol/L (ref 98–111)
Creatinine, Ser: 1.76 mg/dL — ABNORMAL HIGH (ref 0.44–1.00)
GFR calc Af Amer: 32 mL/min — ABNORMAL LOW (ref >60)
GFR calc non Af Amer: 28 mL/min — ABNORMAL LOW (ref >60)
Glucose, Bld: 236 mg/dL — ABNORMAL HIGH (ref 70–99)
Potassium: 4 mmol/L (ref 3.5–5.1)
Sodium: 142 mmol/L (ref 135–145)

## 2020-02-12 LAB — GLUCOSE, CAPILLARY
Glucose-Capillary: 213 mg/dL — ABNORMAL HIGH (ref 70–99)
Glucose-Capillary: 200 mg/dL — ABNORMAL HIGH (ref 70–99)
Glucose-Capillary: 167 mg/dL — ABNORMAL HIGH (ref 70–99)
Glucose-Capillary: 175 mg/dL — ABNORMAL HIGH (ref 70–99)

## 2020-02-12 LAB — SARS CORONAVIRUS 2 (TAT 6-24 HRS): SARS Coronavirus 2: NEGATIVE

## 2020-02-12 MED ORDER — DOXYLAMINE SUCCINATE (SLEEP) 25 MG PO TABS
25.0000 mg | ORAL_TABLET | Freq: Every evening | ORAL | Status: DC | PRN
  Administered 2020-02-12: 25 mg via ORAL
  Filled 2020-02-12 (×4): qty 1

## 2020-02-12 NOTE — Progress Notes (Signed)
Orthostatic vitals obtained Lying BP 131/53, HR 96; sitting 106/68, HR 97; standing BP 125/68, HR 117.

## 2020-02-12 NOTE — TOC Progression Note (Signed)
Transition of Care [facility]) - Progression Note    Patient Details  Name: Cyrah Mclamb MRN: [id number] Date of Birth: 1946/11/17  Transition of Care [facility]) CM/SW Contact  Truitt Merle, LCSW Phone Number: 02/12/2020, 1:09 PM  Clinical Narrative:    Rounded with MD who stated patient may be ready for discharge today or tomorrow, pending continued medical work up.   Spoke with on-call PACE RN Idalia Needle who stated a SNF bed will be available when patient is ready for discharge. Updated attending and RN via epic message. LCSW continues to follow for transitions of care/discharge planning.    Expected Discharge Plan: [place]    Expected Discharge Plan and Services Expected Discharge Plan: [place] arrangements for the past 2 months: Apartment                                       Social Determinants of Health (SDOH) Interventions    Readmission Risk Interventions No flowsheet data found.

## 2020-02-12 NOTE — Progress Notes (Signed)
Patient ID: Alexis Mclaughlin, female   DOB: May 05, 1946, 74 y.o.   MRN: [id number] [facility] PROGRESS NOTE  Ellarose Brandi GUR:[id number] DOB: Sep 23, 1946 DOA: 02/09/2020 PCP: Patient, No Pcp Per  HPI/Subjective: Patient states that she is eating a little bit better.  Still feels a little bit weak.  No burning on urination.  No abdominal pain.  Objective: Vitals:   02/12/20 0557 02/12/20 1219  BP: 118/68 134/67  Pulse: 80 90  Resp: 17 16  Temp: 98.9 F (37.2 C) 98 F (36.7 C)  SpO2: 100% 100%    Intake/Output Summary (Last 24 hours) at 02/12/2020 1401 Last data filed at 02/12/2020 1300 Gross per 24 hour  Intake 2079.73 ml  Output --  Net 2079.73 ml   Filed Weights   02/09/20 1341  Weight: 90.7 kg    ROS: Review of Systems  Constitutional: Negative for chills and fever.  Eyes: Negative for blurred vision.  Respiratory: Negative for cough and shortness of breath.   Cardiovascular: Negative for chest pain.  Gastrointestinal: Negative for abdominal pain, constipation, diarrhea, nausea and vomiting.  Genitourinary: Negative for dysuria.  Musculoskeletal: Positive for joint pain.  Neurological: Negative for dizziness and headaches.   Exam: Physical Exam  HENT:  Nose: No mucosal edema.  Mouth/Throat: No oropharyngeal exudate or posterior oropharyngeal edema.  Eyes: Conjunctivae and lids are normal.  Neck: Carotid bruit is not present.  Cardiovascular: S1 normal and S2 normal. Exam reveals no gallop.  No murmur heard. Respiratory: No respiratory distress. She has decreased breath sounds in the right lower field and the left lower field. She has no wheezes. She has no rhonchi. She has no rales.  GI: Soft. Bowel sounds are normal. There is no abdominal tenderness.  Musculoskeletal:     Right ankle: Swelling present.     Left ankle: Swelling present.  Lymphadenopathy:    She has no cervical adenopathy.  Neurological: She is alert. No cranial nerve deficit.  Skin: Skin is  warm. No rash noted. Nails show no clubbing.  Psychiatric: She has a normal mood and affect.      Data Reviewed: Basic Metabolic Panel: Recent Labs  Lab 02/09/20 1349 02/10/20 0537 02/11/20 0908 02/12/20 0500  NA 132* 137 140 142  K 4.0 3.5 3.4* 4.0  CL 92* 99 104 103  CO2 27 31 26 25   GLUCOSE 177* 109* 145* 236*  BUN 79* 65* 48* 47*  CREATININE 2.63* 2.12* 2.01* 1.76*  CALCIUM 8.7* 8.5* 8.1* 8.3*   Liver Function Tests: Recent Labs  Lab 02/09/20 1349  AST 37  ALT 29  ALKPHOS 76  BILITOT 1.0  PROT 6.0*  ALBUMIN 2.8*   CBC: Recent Labs  Lab 02/09/20 1349 02/10/20 0537 02/12/20 0500  WBC 11.8* 10.1 8.0  HGB 11.8* 11.9* 10.3*  HCT 35.3* 36.3 32.9*  MCV 75.6* 77.1* 79.3*  PLT 253 253 252   Cardiac Enzymes: Recent Labs  Lab 02/09/20 1349  CKTOTAL 270*   CBG: Recent Labs  Lab 02/11/20 1135 02/11/20 1635 02/11/20 2151 02/12/20 0755 02/12/20 1223  GLUCAP 151* 131* 167* 213* 200*    Recent Results (from the past 240 hour(s))  Urine Culture     Status: Abnormal   Collection Time: 02/09/20  2:08 PM   Specimen: Urine, Clean Catch  Result Value Ref Range Status   Specimen Description   Final    URINE, CLEAN CATCH Performed at [facility], [address] [place], [place] [address]    Special Requests NONE  Final   Culture (A)  Final    <10,000 COLONIES/mL INSIGNIFICANT GROWTH Performed at [facility], [place] [address] [place], [place] [address]    Report Status 02/10/2020 FINAL  Final  SARS CORONAVIRUS 2 (TAT 6-24 HRS) Nasopharyngeal Nasopharyngeal Swab     Status: None   Collection Time: 02/09/20  3:19 PM   Specimen: Nasopharyngeal Swab  Result Value Ref Range Status   SARS Coronavirus 2 NEGATIVE NEGATIVE Final    Comment: (NOTE) SARS-CoV-2 target nucleic acids are NOT DETECTED. The SARS-CoV-2 RNA is generally detectable in upper and lower respiratory specimens during the acute phase of infection. Negative results do not  preclude SARS-CoV-2 infection, do not rule out co-infections with other pathogens, and should not be used as the sole basis for treatment or other patient management decisions. Negative results must be combined with clinical observations, patient history, and epidemiological information. The expected result is Negative. Fact Sheet for Patients: SugarRoll.be Fact Sheet for Healthcare Providers: https://www.woods-mathews.com/ This test is not yet approved or cleared by the [place] FDA and  has been authorized for detection and/or diagnosis of SARS-CoV-2 by FDA under an Emergency Use Authorization (EUA). This EUA will remain  in effect (meaning this test can be used) for the duration of the COVID-19 declaration under Section 56 4(b)(1) of the Act, 21 U.S.C. section 360bbb-3(b)(1), unless the authorization is terminated or revoked sooner. Performed at [facility], [place] [address] [place], [place] [address]      Studies: US RENAL  Result Date: 02/11/2020 CLINICAL DATA:  74 year old female with acute renal insufficiency. EXAM: RENAL / URINARY TRACT ULTRASOUND COMPLETE COMPARISON:  None. FINDINGS: Right Kidney: Renal measurements: 7.7 x 4.1 x 4.8 cm = volume: 81 mL. Normal echogenicity. No hydronephrosis or shadowing stone. Apparent 9 mm hypoechoic exophytic area from the interpolar aspect of the right kidney may be related to nodular contour or represent a cyst. This is however suboptimally visualized and poorly characterized. Follow-up with ultrasound in 3 months recommended. Left Kidney: Renal measurements: 9.9 x 4.8 x 5.5 cm = volume: 138 mL. Normal echogenicity. No hydronephrosis or shadowing stone. Bladder: Appears normal for degree of bladder distention. Other: None. IMPRESSION: 1. No hydronephrosis or shadowing stone. 2. Poorly visualized apparent exophytic hypoechoic structure from the interpolar right kidney which may represent a  lobulated renal cortex or a cyst. Follow-up with ultrasound in 3 months recommended. Electronically Signed   By: Anner Crete M.D.   On: 02/11/2020 15:39    Scheduled Meds: . aspirin EC  81 mg Oral Daily  . DULoxetine  60 mg Oral Daily  . feeding supplement (ENSURE ENLIVE)  237 mL Oral BID BM  . heparin  5,000 Units Subcutaneous Q8H  . insulin aspart  0-5 Units Subcutaneous QHS  . insulin aspart  0-9 Units Subcutaneous TID WC  . levothyroxine  50 mcg Oral QAC breakfast  . midodrine  5 mg Oral TID WC  . multivitamin with minerals  1 tablet Oral Daily   Continuous Infusions: . sodium chloride 75 mL/hr at 02/12/20 0019    Assessment/Plan:  1. Acute kidney injury.  With IV fluids creatinine has improved from 2.63 on admission down to 1.79.  Her baseline back in September 2020 was 0.85.  Continue IV fluid hydration.  Hold antihypertensive medications and Bactrim.  If creatinine improved tomorrow hopefully will discharge to rehab. 2. Orthostatic hypotension.  Continue IV fluids.  Continue to hold atenolol and Hyzaar.  Continue low-dose midodrine 3.  Acute cystitis without hematuria with leukocytosis.  Urine culture growing only less than 10,000 organisms.  Antibiotics discontinued 4. Right shoulder pain secondary to osteoarthritis.  OT evaluation 5. Hypothyroidism unspecified on levothyroxine 6. Weakness.  Physical therapy recommended rehab. 7. Hyperlipidemia unspecified.  Continue hold statin at this time 8. Type 2 diabetes mellitus with hyperlipidemia.  Hemoglobin A1c elevated at 7.4.  Patient on sliding scale only. 9. Patient did well with speech therapy yesterday.  Patient is picking on what she eats.  Some textures she is very picky with.  Code Status:     Code Status Orders  (From admission, onward)         Start     Ordered   02/09/20 1529  Do not attempt resuscitation (DNR)  Continuous    Question Answer Comment  In the event of cardiac or respiratory ARREST Do not call  a "code blue"   In the event of cardiac or respiratory ARREST Do not perform Intubation, CPR, defibrillation or ACLS   In the event of cardiac or respiratory ARREST Use medication by any route, position, wound care, and other measures to relive pain and suffering. May use oxygen, suction and manual treatment of airway obstruction as needed for comfort.   Comments nurse may pronounce      02/09/20 1529        Code Status History    This patient has a current code status but no historical code status.   Advance Care Planning Activity    Advance Directive Documentation     Most Recent Value  Type of Advance Directive  Out of facility DNR (pink MOST or yellow form)  Pre-existing out of facility DNR order (yellow form or pink MOST form)  Pink MOST form placed in chart (order not valid for inpatient use)  "MOST" Form in Place?  --     Family Communication: Spoke with daughter on the phone Disposition Plan: Potentially out to rehab tomorrow.  Time spent: 26 minutes  [place]

## 2020-02-13 DIAGNOSIS — R197 Diarrhea, unspecified: Secondary | ICD-10-CM

## 2020-02-13 LAB — BASIC METABOLIC PANEL
Anion gap: 8 (ref 5–15)
BUN: 39 mg/dL — ABNORMAL HIGH (ref 8–23)
CO2: 28 mmol/L (ref 22–32)
Calcium: 8.3 mg/dL — ABNORMAL LOW (ref 8.9–10.3)
Chloride: 110 mmol/L (ref 98–111)
Creatinine, Ser: 1.34 mg/dL — ABNORMAL HIGH (ref 0.44–1.00)
GFR calc Af Amer: 45 mL/min — ABNORMAL LOW (ref >60)
GFR calc non Af Amer: 39 mL/min — ABNORMAL LOW (ref >60)
Glucose, Bld: 134 mg/dL — ABNORMAL HIGH (ref 70–99)
Potassium: 4.4 mmol/L (ref 3.5–5.1)
Sodium: 146 mmol/L — ABNORMAL HIGH (ref 135–145)

## 2020-02-13 LAB — GLUCOSE, CAPILLARY
Glucose-Capillary: 175 mg/dL — ABNORMAL HIGH (ref 70–99)
Glucose-Capillary: 142 mg/dL — ABNORMAL HIGH (ref 70–99)
Glucose-Capillary: 126 mg/dL — ABNORMAL HIGH (ref 70–99)
Glucose-Capillary: 105 mg/dL — ABNORMAL HIGH (ref 70–99)

## 2020-02-13 LAB — TSH: TSH: 6.305 u[IU]/mL — ABNORMAL HIGH (ref 0.350–4.500)

## 2020-02-13 LAB — PREPARE RBC (CROSSMATCH)

## 2020-02-13 MED ORDER — DICLOFENAC SODIUM 1 % EX GEL
4.0000 g | Freq: Four times a day (QID) | CUTANEOUS | Status: DC
  Administered 2020-02-13 – 2020-02-14 (×5): 4 g via TOPICAL
  Filled 2020-02-13: qty 100

## 2020-02-13 MED ORDER — QUETIAPINE FUMARATE 25 MG PO TABS
12.5000 mg | ORAL_TABLET | Freq: Every day | ORAL | Status: DC
  Administered 2020-02-13: 12.5 mg via ORAL
  Filled 2020-02-13: qty 1

## 2020-02-13 MED ORDER — OXYCODONE HCL 5 MG PO TABS: 5.0000 mg | ORAL_TABLET | ORAL | Status: DC | PRN

## 2020-02-13 MED ORDER — LOPERAMIDE HCL 2 MG PO CAPS
2.0000 mg | ORAL_CAPSULE | Freq: Three times a day (TID) | ORAL | Status: DC | PRN
  Administered 2020-02-13: 2 mg via ORAL
  Filled 2020-02-13: qty 1

## 2020-02-13 MED ORDER — FUROSEMIDE 10 MG/ML IJ SOLN
40.0000 mg | Freq: Once | INTRAMUSCULAR | Status: AC
  Administered 2020-02-13: 40 mg via INTRAVENOUS
  Filled 2020-02-13: qty 4

## 2020-02-13 MED ORDER — LOPERAMIDE HCL 2 MG PO CAPS
4.0000 mg | ORAL_CAPSULE | Freq: Once | ORAL | Status: AC
  Administered 2020-02-13: 4 mg via ORAL
  Filled 2020-02-13: qty 2

## 2020-02-13 MED ORDER — SODIUM CHLORIDE 0.9% IV SOLUTION: Freq: Once | INTRAVENOUS | Status: DC

## 2020-02-13 MED ORDER — ACETAMINOPHEN 325 MG PO TABS
650.0000 mg | ORAL_TABLET | Freq: Once | ORAL | Status: AC
  Administered 2020-02-13: 650 mg via ORAL
  Filled 2020-02-13: qty 2

## 2020-02-13 NOTE — Progress Notes (Signed)
Patient ID: Alexis Mclaughlin, female   DOB: 1946-08-29, 74 y.o.   MRN: [id number] [facility] PROGRESS NOTE  Carli Lefevers DXI:[id number] DOB: 10-14-1946 DOA: 02/09/2020 PCP: Patient, No Pcp Per  HPI/Subjective: Patient was yelling at me that I asked her to many questions.  She states that she has shoulder and neck pain.  She had diarrhea.  She stated she slept well.  Objective: Vitals:   02/13/20 0548 02/13/20 1205  BP: (!) 146/74 (!) 144/82  Pulse: 95 80  Resp: 18 18  Temp: 97.7 F (36.5 C) 98.5 F (36.9 C)  SpO2: 100% 100%    Intake/Output Summary (Last 24 hours) at 02/13/2020 1226 Last data filed at 02/13/2020 0104 Gross per 24 hour  Intake 1576.71 ml  Output 700 ml  Net 876.71 ml   Filed Weights   02/09/20 1341  Weight: 90.7 kg    ROS: Review of Systems  Unable to perform ROS: Dementia  Respiratory: Negative for shortness of breath.   Cardiovascular: Negative for chest pain.  Gastrointestinal: Positive for diarrhea. Negative for abdominal pain, nausea and vomiting.  Musculoskeletal: Positive for joint pain and neck pain.   Exam: Physical Exam  HENT:  Nose: No mucosal edema.  Mouth/Throat: No oropharyngeal exudate or posterior oropharyngeal edema.  Eyes: Conjunctivae and lids are normal.  Neck: Carotid bruit is not present.  Cardiovascular: S1 normal and S2 normal. Exam reveals no gallop.  No murmur heard. Respiratory: No respiratory distress. She has decreased breath sounds in the right lower field and the left lower field. She has no wheezes. She has no rhonchi. She has no rales.  GI: Soft. Bowel sounds are normal. There is no abdominal tenderness.  Musculoskeletal:     Right ankle: Swelling present.     Left ankle: Swelling present.  Lymphadenopathy:    She has no cervical adenopathy.  Neurological: She is alert. No cranial nerve deficit.  Skin: Skin is warm. No rash noted. Nails show no clubbing.  Psychiatric: She has a normal mood and affect.       Data Reviewed: Basic Metabolic Panel: Recent Labs  Lab 02/09/20 1349 02/10/20 0537 02/11/20 0908 02/12/20 0500 02/13/20 0449  NA 132* 137 140 142 146*  K 4.0 3.5 3.4* 4.0 4.4  CL 92* 99 104 103 110  CO2 27 31 26 25 28   GLUCOSE 177* 109* 145* 236* 134*  BUN 79* 65* 48* 47* 39*  CREATININE 2.63* 2.12* 2.01* 1.76* 1.34*  CALCIUM 8.7* 8.5* 8.1* 8.3* 8.3*   Liver Function Tests: Recent Labs  Lab 02/09/20 1349  AST 37  ALT 29  ALKPHOS 76  BILITOT 1.0  PROT 6.0*  ALBUMIN 2.8*   CBC: Recent Labs  Lab 02/09/20 1349 02/10/20 0537 02/12/20 0500  WBC 11.8* 10.1 8.0  HGB 11.8* 11.9* 10.3*  HCT 35.3* 36.3 32.9*  MCV 75.6* 77.1* 79.3*  PLT 253 253 252   Cardiac Enzymes: Recent Labs  Lab 02/09/20 1349  CKTOTAL 270*   CBG: Recent Labs  Lab 02/12/20 1223 02/12/20 1720 02/12/20 2140 02/13/20 0742 02/13/20 1202  GLUCAP 200* 167* 175* 175* 142*    Recent Results (from the past 240 hour(s))  Urine Culture     Status: Abnormal   Collection Time: 02/09/20  2:08 PM   Specimen: Urine, Clean Catch  Result Value Ref Range Status   Specimen Description   Final    URINE, CLEAN CATCH Performed at [facility], [address] [place], [place] [address]    Special  Requests NONE  Final   Culture (A)  Final    <10,000 COLONIES/mL INSIGNIFICANT GROWTH Performed at [facility], [place] [address] [place], [place] [address]    Report Status 02/10/2020 FINAL  Final  SARS CORONAVIRUS 2 (TAT 6-24 HRS) Nasopharyngeal Nasopharyngeal Swab     Status: None   Collection Time: 02/09/20  3:19 PM   Specimen: Nasopharyngeal Swab  Result Value Ref Range Status   SARS Coronavirus 2 NEGATIVE NEGATIVE Final    Comment: (NOTE) SARS-CoV-2 target nucleic acids are NOT DETECTED. The SARS-CoV-2 RNA is generally detectable in upper and lower respiratory specimens during the acute phase of infection. Negative results do not preclude SARS-CoV-2 infection, do not rule  out co-infections with other pathogens, and should not be used as the sole basis for treatment or other patient management decisions. Negative results must be combined with clinical observations, patient history, and epidemiological information. The expected result is Negative. Fact Sheet for Patients: SugarRoll.be Fact Sheet for Healthcare Providers: https://www.woods-mathews.com/ This test is not yet approved or cleared by the [place] FDA and  has been authorized for detection and/or diagnosis of SARS-CoV-2 by FDA under an Emergency Use Authorization (EUA). This EUA will remain  in effect (meaning this test can be used) for the duration of the COVID-19 declaration under Section 56 4(b)(1) of the Act, 21 U.S.C. section 360bbb-3(b)(1), unless the authorization is terminated or revoked sooner. Performed at [facility], [place] [address], [place], [place] [address]   SARS CORONAVIRUS 2 (TAT 6-24 HRS) Nasopharyngeal Nasopharyngeal Swab     Status: None   Collection Time: 02/12/20  3:38 PM   Specimen: Nasopharyngeal Swab  Result Value Ref Range Status   SARS Coronavirus 2 NEGATIVE NEGATIVE Final    Comment: (NOTE) SARS-CoV-2 target nucleic acids are NOT DETECTED. The SARS-CoV-2 RNA is generally detectable in upper and lower respiratory specimens during the acute phase of infection. Negative results do not preclude SARS-CoV-2 infection, do not rule out co-infections with other pathogens, and should not be used as the sole basis for treatment or other patient management decisions. Negative results must be combined with clinical observations, patient history, and epidemiological information. The expected result is Negative. Fact Sheet for Patients: SugarRoll.be Fact Sheet for Healthcare Providers: https://www.woods-mathews.com/ This test is not yet approved or cleared by the [place] FDA and   has been authorized for detection and/or diagnosis of SARS-CoV-2 by FDA under an Emergency Use Authorization (EUA). This EUA will remain  in effect (meaning this test can be used) for the duration of the COVID-19 declaration under Section 56 4(b)(1) of the Act, 21 U.S.C. section 360bbb-3(b)(1), unless the authorization is terminated or revoked sooner. Performed at [facility], [place] [address], [place], [place] [address]      Studies: US RENAL  Result Date: 02/11/2020 CLINICAL DATA:  74 year old female with acute renal insufficiency. EXAM: RENAL / URINARY TRACT ULTRASOUND COMPLETE COMPARISON:  None. FINDINGS: Right Kidney: Renal measurements: 7.7 x 4.1 x 4.8 cm = volume: 81 mL. Normal echogenicity. No hydronephrosis or shadowing stone. Apparent 9 mm hypoechoic exophytic area from the interpolar aspect of the right kidney may be related to nodular contour or represent a cyst. This is however suboptimally visualized and poorly characterized. Follow-up with ultrasound in 3 months recommended. Left Kidney: Renal measurements: 9.9 x 4.8 x 5.5 cm = volume: 138 mL. Normal echogenicity. No hydronephrosis or shadowing stone. Bladder: Appears normal for degree of bladder distention. Other: None. IMPRESSION:  1. No hydronephrosis or shadowing stone. 2. Poorly visualized apparent exophytic hypoechoic structure from the interpolar right kidney which may represent a lobulated renal cortex or a cyst. Follow-up with ultrasound in 3 months recommended. Electronically Signed   By: Anner Crete M.D.   On: 02/11/2020 15:39    Scheduled Meds: . sodium chloride   Intravenous Once  . aspirin EC  81 mg Oral Daily  . diclofenac Sodium  4 g Topical QID  . DULoxetine  60 mg Oral Daily  . heparin  5,000 Units Subcutaneous Q8H  . insulin aspart  0-5 Units Subcutaneous QHS  . insulin aspart  0-9 Units Subcutaneous TID WC  . levothyroxine  50 mcg Oral QAC breakfast  . midodrine  5 mg Oral TID WC  .  multivitamin with minerals  1 tablet Oral Daily  . QUEtiapine  12.5 mg Oral QHS   Continuous Infusions: . sodium chloride 50 mL/hr at 02/12/20 2001    Assessment/Plan:  1. Diarrhea.  The patient had 5 episodes of diarrhea overnight and another 2 today.  Trial of Imodium.  Here in the hospital too long to send off stool studies.  Likely secondary to drinking too many ensures.  Will hold on giving Ensure today.   2. Acute kidney injury.  With IV fluids creatinine has improved from 2.63 on admission down to 1.34.  Her baseline back in September 2020 was 0.85.  Continue IV fluid hydration.  Hold antihypertensive medications and Bactrim. 3. Orthostatic hypotension.  Continue IV fluids.  Continue to hold atenolol and Hyzaar.  Continue low-dose midodrine 4. Right shoulder pain secondary to osteoarthritis.  Neck pain.  Trial of diclofenac gel 5. Hypothyroidism unspecified on levothyroxine 6. Weakness.  Physical therapy recommended rehab. 7. Hyperlipidemia unspecified.  Continue hold statin at this time 8. Type 2 diabetes mellitus with hyperlipidemia.  Hemoglobin A1c elevated at 7.4.  Patient on sliding scale only. 9. Insomnia last night, will give a trial of Seroquel low-dose.  Code Status:     Code Status Orders  (From admission, onward)         Start     Ordered   02/09/20 1529  Do not attempt resuscitation (DNR)  Continuous    Question Answer Comment  In the event of cardiac or respiratory ARREST Do not call a "code blue"   In the event of cardiac or respiratory ARREST Do not perform Intubation, CPR, defibrillation or ACLS   In the event of cardiac or respiratory ARREST Use medication by any route, position, wound care, and other measures to relive pain and suffering. May use oxygen, suction and manual treatment of airway obstruction as needed for comfort.   Comments nurse may pronounce      02/09/20 1529        Code Status History    This patient has a current code status but no  historical code status.   Advance Care Planning Activity    Advance Directive Documentation     Most Recent Value  Type of Advance Directive  Out of facility DNR (pink MOST or yellow form)  Pre-existing out of facility DNR order (yellow form or pink MOST form)  Pink MOST form placed in chart (order not valid for inpatient use)  "MOST" Form in Place?  --     Family Communication: Spoke with daughter on the phone Disposition Plan: With 5 episodes of diarrhea this morning and another 2 this morning I will hold off on discharge today.  Trial of  Imodium.  Hopefully will be able to discharge out to rehab tomorrow  Time spent: 28 minutes  [place]

## 2020-02-13 NOTE — TOC Progression Note (Deleted)
Transition of Care [facility]) - Progression Note    Patient Details  Name: Alexis Mclaughlin MRN: [id number] Date of Birth: 10/14/1946  Transition of Care [facility]) CM/SW Contact  [address], [place], [place] Phone Number: 02/13/2020, 2:26 PM  Clinical Narrative:    Phone call to Deneise Lever, NP at [facility] to inform her that patient will not discharge today but anticipated discharge will be tomorrow. They will arrange transport to [facility] when ready.   Tu Shimmel, LCSW Clinical Social Work [phone]   Expected Discharge Plan: [place]    Expected Discharge Plan and Services Expected Discharge Plan: [place]       Living arrangements for the past 2 months: Apartment                                       Social Determinants of Health (SDOH) Interventions    Readmission Risk Interventions No flowsheet data found.

## 2020-02-13 NOTE — Progress Notes (Signed)
Physical Therapy Treatment Patient Details Name: Alexis Mclaughlin MRN: [id number] DOB: May 14, 1946 Today's Date: 02/13/2020    History of Present Illness Alexis Mclaughlin is a 47yoF who comes to [facility] on 02/09/20 after found on floor unable to get up. PMH: DM2, HTN. Pt is a [facility] program participant. Pt admitted for UTI. Pt denies any injury from fall, but has chronic shoulder pain. Initially with soft BP. Pt has had orthostatic BP issues 2/18, 2/19.    PT Comments    Patient in bed with granddaughter cleaning her nails upon PT arrival. Initially agreeable to therapy session and tolerated supine strengthening interventions with decline of body mechanics with repetition. Verbal encouragement, counting out loud, and visual cue of PT hand improved body mechanics with increased patient performance. Patient intermittently verbalized negative comments at PT. PT noted patient needed new bedding/clean up and upon attempt to notify nursing patient reported she was done with PT. CNA notified of need for clean up. Current POC remains appropriate at this time.     Follow Up Recommendations  SNF;Supervision for mobility/OOB;Supervision - Intermittent     Equipment Recommendations  None recommended by PT    Recommendations for Other Services       Precautions / Restrictions Precautions Precautions: Fall Restrictions Other Position/Activity Restrictions: *chronic Rt shoulder pain    Mobility  Bed Mobility               General bed mobility comments: deferred due to need to clean/patient refusal  Transfers                    Ambulation/Gait                 Stairs             Wheelchair Mobility    Modified Rankin (Stroke Patients Only)       Balance                                            Cognition Arousal/Alertness: Awake/alert Behavior During Therapy: WFL for tasks assessed/performed;Agitated Overall Cognitive Status: History of  cognitive impairments - at baseline                                 General Comments: Patient intermittently particapatory during session with occasional negative remarks directed towards therapist.      Exercises General Exercises - Lower Extremity Ankle Circles/Pumps: Strengthening;Both;10 reps;Supine Quad Sets: Strengthening;Both;10 reps;Supine Short Arc Quad: AROM;Both;Supine;10 reps Heel Slides: AROM;Both;Supine;10 reps;Strengthening Hip ABduction/ADduction: AROM;Both;Supine;10 reps Straight Leg Raises: Strengthening;Both;10 reps;Supine    General Comments        Pertinent Vitals/Pain Pain Assessment: Faces Faces Pain Scale: Hurts little more Pain Location: R shoulder pain Pain Descriptors / Indicators: Aching;Grimacing Pain Intervention(s): Limited activity within patient's tolerance;Repositioned;Monitored during session    Home Living                      Prior Function            PT Goals (current goals can now be found in the care plan section) Acute Rehab PT Goals Patient Stated Goal: regain her baseline independence in mobility PT Goal Formulation: With patient Time For Goal Achievement: 02/24/20 Potential to Achieve Goals: Fair Progress towards PT goals: Progressing toward goals  Frequency    Min 2X/week      PT Plan Current plan remains appropriate    Co-evaluation              AM-PAC PT "6 Clicks" Mobility   Outcome Measure  Help needed turning from your back to your side while in a flat bed without using bedrails?: A Lot Help needed moving from lying on your back to sitting on the side of a flat bed without using bedrails?: A Lot Help needed moving to and from a bed to a chair (including a wheelchair)?: Total Help needed standing up from a chair using your arms (e.g., wheelchair or bedside chair)?: Total Help needed to walk in hospital room?: Total Help needed climbing 3-5 steps with a railing? : Total 6 Click  Score: 8    End of Session   Activity Tolerance: No increased pain Patient left: in bed;with call bell/phone within reach;with family/visitor present Nurse Communication: Mobility status PT Visit Diagnosis: Unsteadiness on feet (R26.81);Other abnormalities of gait and mobility (R26.89);Repeated falls (R29.6);Muscle weakness (generalized) (M62.81);History of falling (Z91.81);Difficulty in walking, not elsewhere classified (R26.2)     Time: 3419-6222 PT Time Calculation (min) (ACUTE ONLY): 11 min  Charges:  $Therapeutic Exercise: 8-22 mins           Janna Arch, PT, DPT            02/13/2020, 3:47 PM

## 2020-02-13 NOTE — TOC Progression Note (Signed)
Transition of Care [facility]) - Progression Note    Patient Details  Name: Alexis Mclaughlin MRN: [id number] Date of Birth: 06/18/1946  Transition of Care [facility]) CM/SW Contact  [address] [place], [place] Phone Number: 02/13/2020, 3:31 PM  Clinical Narrative:    Phone call to Deneise Lever, NP at [facility] to inform her that patient will not be discharging today. Anticipated discharge to [facility]  for tomorrow. This social worker informed that [employer] will arrange transport to [facility] [employer].   Etherine Mackowiak, LCSW Clinical Social Work [phone]    Expected Discharge Plan: [place]    Expected Discharge Plan and Services Expected Discharge Plan: [place]       Living arrangements for the past 2 months: Apartment                                       Social Determinants of Health (SDOH) Interventions    Readmission Risk Interventions No flowsheet data found.

## 2020-02-14 DIAGNOSIS — Z992 Dependence on renal dialysis: Secondary | ICD-10-CM

## 2020-02-14 DIAGNOSIS — N186 End stage renal disease: Secondary | ICD-10-CM

## 2020-02-14 DIAGNOSIS — E1122 Type 2 diabetes mellitus with diabetic chronic kidney disease: Secondary | ICD-10-CM

## 2020-02-14 LAB — BASIC METABOLIC PANEL
Anion gap: 7 (ref 5–15)
BUN: 29 mg/dL — ABNORMAL HIGH (ref 8–23)
CO2: 28 mmol/L (ref 22–32)
Calcium: 8.2 mg/dL — ABNORMAL LOW (ref 8.9–10.3)
Chloride: 110 mmol/L (ref 98–111)
Creatinine, Ser: 1.25 mg/dL — ABNORMAL HIGH (ref 0.44–1.00)
GFR calc Af Amer: 49 mL/min — ABNORMAL LOW (ref >60)
GFR calc non Af Amer: 42 mL/min — ABNORMAL LOW (ref >60)
Glucose, Bld: 112 mg/dL — ABNORMAL HIGH (ref 70–99)
Potassium: 4.6 mmol/L (ref 3.5–5.1)
Sodium: 145 mmol/L (ref 135–145)

## 2020-02-14 LAB — GLUCOSE, CAPILLARY
Glucose-Capillary: 110 mg/dL — ABNORMAL HIGH (ref 70–99)
Glucose-Capillary: 128 mg/dL — ABNORMAL HIGH (ref 70–99)

## 2020-02-14 MED ORDER — DICLOFENAC SODIUM 1 % EX GEL: CUTANEOUS | 0 refills | Status: DC

## 2020-02-14 MED ORDER — ADULT MULTIVITAMIN W/MINERALS CH: 1.0000 | ORAL_TABLET | Freq: Every day | ORAL | 0 refills | Status: DC

## 2020-02-14 MED ORDER — ACETAMINOPHEN 325 MG PO TABS: 650.0000 mg | ORAL_TABLET | Freq: Four times a day (QID) | ORAL | Status: DC | PRN

## 2020-02-14 MED ORDER — MIDODRINE HCL 5 MG PO TABS: 5.0000 mg | ORAL_TABLET | Freq: Three times a day (TID) | ORAL | 0 refills | Status: DC

## 2020-02-14 MED ORDER — LOPERAMIDE HCL 2 MG PO CAPS: 2.0000 mg | ORAL_CAPSULE | Freq: Three times a day (TID) | ORAL | 0 refills | Status: DC | PRN

## 2020-02-14 MED ORDER — QUETIAPINE FUMARATE 25 MG PO TABS: 12.5000 mg | ORAL_TABLET | Freq: Every evening | ORAL | 0 refills | Status: DC | PRN

## 2020-02-14 NOTE — TOC Transition Note (Signed)
Transition of Care [facility]) - CM/SW Discharge Note   Patient Details  Name: Alexis Mclaughlin MRN: [id number] Date of Birth: 01-23-1946  Transition of Care [facility]) CM/SW Contact:  Beverly Sessions, RN Phone Number: 02/14/2020, 1:00 PM   Clinical Narrative:    Patient to discharge to [facility] today DC summary faxed to Dr Lavone Neri at [facility].  She has arranged transport between 1230-1  DNR signed and put on chart  Bedside RN notified and to call report to [facility]    Final next level of care: Skilled Nursing Facility Barriers to Discharge: No Barriers Identified   Patient Goals and CMS Choice        Discharge Placement              Patient chooses bed at: [facility] [place] Patient to be transferred to facility by: [facility]      Discharge Plan and Services                                     Social Determinants of Health (SDOH) Interventions     Readmission Risk Interventions No flowsheet data found.

## 2020-02-14 NOTE — Care Management Important Message (Signed)
Important Message  Patient Details  Name: Alexis Mclaughlin MRN: [id number] Date of Birth: [birth year]   Medicare Important Message Given:  Yes     Dannette Barbara 02/14/2020, 1:20 PM

## 2020-02-14 NOTE — Discharge Instructions (Signed)

## 2020-02-14 NOTE — Discharge Summary (Signed)
[facility] NAME: Alexis Mclaughlin    MR#:  [id number]  DATE OF BIRTH:  May 13, 1946  DATE OF ADMISSION:  02/09/2020 ADMITTING PHYSICIAN: Loletha Grayer, MD  DATE OF DISCHARGE: 02/14/2020  PRIMARY CARE PHYSICIAN: Patient, No Pcp Per    ADMISSION DIAGNOSIS:  Lower urinary tract infectious disease [N39.0] Weakness [R53.1] Pain [R52] Acute renal insufficiency [N28.9] Acute kidney injury ([place]) [N17.9]  DISCHARGE DIAGNOSIS:  Active Problems:   AKI (acute kidney injury) ([place])   Acute kidney injury ([place])   Diarrhea   SECONDARY DIAGNOSIS:   Past Medical History:  Diagnosis Date  . Diabetes mellitus without complication ([place])   . Hypertension   . Hypothyroidism     HOSPITAL COURSE:   1.  Acute kidney injury.  The patient's creatinine has improved from 2.63 down to 1.25.  Her baseline kidney function is 0.85 back in September 2020.  We held her antihypertensive medications and Bactrim while here in the hospital. 2.  Orthostatic hypotension.  We gave IV fluids and I needed to start low-dose midodrine 5 mg 3 times daily.  Hopefully in the future can get off the midodrine, but at this point I will continue.  Hold antihypertensive medications. 3.  Diarrhea.  The patient had quite a few episodes of diarrhea yesterday from drinking a lot of Ensure.  The patient will need a supplement that does not cause diarrhea. 4.  Right shoulder pain and neck pain secondary to osteoarthritis.  Patient not having pain with a trial of diclofenac gel.  I will continue that. 5.  Hypothyroidism unspecified on levothyroxine.  I will continue the same dose since TSH slightly high.  Likely noncompliance at home with medications.  Recheck TSH in 6 weeks. 6.  Weakness.  Physical therapy recommends rehab.  Stable for discharge to rehab today. 7.  Hyperlipidemia unspecified.  Continue to hold statin at this time.  We will leave up to PMD on whether they winter  restarted or not. 8.  Type 2 diabetes mellitus.  Hemoglobin A1c slightly elevated at 7.4.  Diet control at this time.  Can check fingersticks before every meal and nightly. 9.  Insomnia at night.  Seroquel as needed at facility. 10.  Patient is on dysphagia 1 diet with thin liquids.  Continued speech therapy evaluation as outpatient and in rehab 11.  Since our urine culture here was negative antibiotics were discontinued.  DISCHARGE CONDITIONS:   Satisfactory  CONSULTS OBTAINED:  Speech therapy Physical therapy  DRUG ALLERGIES:  No Known Allergies  DISCHARGE MEDICATIONS:   Allergies as of 02/14/2020   No Known Allergies     Medication List    STOP taking these medications   atenolol 100 MG tablet Commonly known as: TENORMIN   atorvastatin 80 MG tablet Commonly known as: LIPITOR   losartan-hydrochlorothiazide 100-25 MG tablet Commonly known as: HYZAAR   sulfamethoxazole-trimethoprim 800-160 MG tablet Commonly known as: BACTRIM DS     TAKE these medications   acetaminophen 325 MG tablet Commonly known as: TYLENOL Take 2 tablets (650 mg total) by mouth every 6 (six) hours as needed for mild pain (or Fever >/= 101).   aspirin EC 81 MG tablet Take 81 mg by mouth daily.   diclofenac Sodium 1 % Gel Commonly known as: VOLTAREN Apply to right shoulder and neck three times a day   DULoxetine 60 MG capsule Commonly known as: CYMBALTA Take 60 mg by mouth daily.   levothyroxine 50 MCG tablet  Commonly known as: SYNTHROID Take 50 mcg by mouth daily before breakfast.   loperamide 2 MG capsule Commonly known as: IMODIUM Take 1 capsule (2 mg total) by mouth every 8 (eight) hours as needed for diarrhea or loose stools.   midodrine 5 MG tablet Commonly known as: PROAMATINE Take 1 tablet (5 mg total) by mouth 3 (three) times daily with meals.   multivitamin with minerals Tabs tablet Take 1 tablet by mouth daily.   QUEtiapine 25 MG tablet Commonly known as:  SEROQUEL Take 0.5 tablets (12.5 mg total) by mouth at bedtime as needed (insomnia).        DISCHARGE INSTRUCTIONS:   Follow up with Dr. At rehab.  If you experience worsening of your admission symptoms, develop shortness of breath, life threatening emergency, suicidal or homicidal thoughts you must seek medical attention immediately by calling 911 or calling your MD immediately  if symptoms less severe.  You Must read complete instructions/literature along with all the possible adverse reactions/side effects for all the Medicines you take and that have been prescribed to you. Take any new Medicines after you have completely understood and accept all the possible adverse reactions/side effects.   Please note  You were cared for by a hospitalist during your hospital stay. If you have any questions about your discharge medications or the care you received while you were in the hospital after you are discharged, you can call the unit and asked to speak with the hospitalist on call if the hospitalist that took care of you is not available. Once you are discharged, your primary care physician will handle any further medical issues. Please note that NO REFILLS for any discharge medications will be authorized once you are discharged, as it is imperative that you return to your primary care physician (or establish a relationship with a primary care physician if you do not have one) for your aftercare needs so that they can reassess your need for medications and monitor your lab values.    Today   CHIEF COMPLAINT:   Chief Complaint  Patient presents with  . Urinary Tract Infection    HISTORY OF PRESENT ILLNESS:  Alexis Mclaughlin  is a 74 y.o. female came in with altered mental status and found to have acute kidney injury   VITAL SIGNS:  Blood pressure (!) 144/76, pulse 97, temperature 98.5 F (36.9 C), temperature source Oral, resp. rate 18, height 5\' 3"  (1.6 m), weight 90.7 kg, SpO2 100  %.  I/O:    Intake/Output Summary (Last 24 hours) at 02/14/2020 5102 Last data filed at 02/14/2020 5852 Gross per 24 hour  Intake 1736.09 ml  Output 1200 ml  Net 536.09 ml    PHYSICAL EXAMINATION:  GENERAL:  74 y.o.-year-old patient lying in the bed with no acute distress.  EYES: Pupils equal, round, reactive to light and accommodation. No scleral icterus. HEENT: Head atraumatic, normocephalic. Oropharynx and nasopharynx clear.  NECK:  Supple, no jugular venous distention. No thyroid enlargement, no tenderness.  LUNGS: Normal breath sounds bilaterally, no wheezing, rales,rhonchi or crepitation. No use of accessory muscles of respiration.  CARDIOVASCULAR: S1, S2 normal. No murmurs, rubs, or gallops.  ABDOMEN: Soft, non-tender, non-distended. Bowel sounds present. No organomegaly or mass.  EXTREMITIES: 2+ pedal edema, no cyanosis, or clubbing.  NEUROLOGIC: Cranial nerves II through XII are intact. Sensation intact. Gait not checked.  PSYCHIATRIC: The patient is alert and answers some yes or no questions.  SKIN: No obvious rash, lesion, or ulcer.  DATA REVIEW:   CBC Recent Labs  Lab 02/12/20 0500  WBC 8.0  HGB 10.3*  HCT 32.9*  PLT 252    Chemistries  Recent Labs  Lab 02/09/20 1349 02/10/20 0537 02/14/20 0422  NA 132*   < > 145  K 4.0   < > 4.6  CL 92*   < > 110  CO2 27   < > 28  GLUCOSE 177*   < > 112*  BUN 79*   < > 29*  CREATININE 2.63*   < > 1.25*  CALCIUM 8.7*   < > 8.2*  AST 37  --   --   ALT 29  --   --   ALKPHOS 76  --   --   BILITOT 1.0  --   --    < > = values in this interval not displayed.     Microbiology Results  Results for orders placed or performed during the hospital encounter of 02/09/20  Urine Culture     Status: Abnormal   Collection Time: 02/09/20  2:08 PM   Specimen: Urine, Clean Catch  Result Value Ref Range Status   Specimen Description   Final    URINE, CLEAN CATCH Performed at [facility], [address]  [place], [place] [address]    Special Requests NONE  Final   Culture (A)  Final    <10,000 COLONIES/mL INSIGNIFICANT GROWTH Performed at [facility], [place] [address] [place], [place] [address]    Report Status 02/10/2020 FINAL  Final  SARS CORONAVIRUS 2 (TAT 6-24 HRS) Nasopharyngeal Nasopharyngeal Swab     Status: None   Collection Time: 02/09/20  3:19 PM   Specimen: Nasopharyngeal Swab  Result Value Ref Range Status   SARS Coronavirus 2 NEGATIVE NEGATIVE Final    Comment: (NOTE) SARS-CoV-2 target nucleic acids are NOT DETECTED. The SARS-CoV-2 RNA is generally detectable in upper and lower respiratory specimens during the acute phase of infection. Negative results do not preclude SARS-CoV-2 infection, do not rule out co-infections with other pathogens, and should not be used as the sole basis for treatment or other patient management decisions. Negative results must be combined with clinical observations, patient history, and epidemiological information. The expected result is Negative. Fact Sheet for Patients: SugarRoll.be Fact Sheet for Healthcare Providers: https://www.woods-mathews.com/ This test is not yet approved or cleared by the [place] FDA and  has been authorized for detection and/or diagnosis of SARS-CoV-2 by FDA under an Emergency Use Authorization (EUA). This EUA will remain  in effect (meaning this test can be used) for the duration of the COVID-19 declaration under Section 56 4(b)(1) of the Act, 21 U.S.C. section 360bbb-3(b)(1), unless the authorization is terminated or revoked sooner. Performed at [facility], [place] [address] [place], [place] [address]   SARS CORONAVIRUS 2 (TAT 6-24 HRS) Nasopharyngeal Nasopharyngeal Swab     Status: None   Collection Time: 02/12/20  3:38 PM   Specimen: Nasopharyngeal Swab  Result Value Ref Range Status   SARS Coronavirus 2 NEGATIVE NEGATIVE Final    Comment:  (NOTE) SARS-CoV-2 target nucleic acids are NOT DETECTED. The SARS-CoV-2 RNA is generally detectable in upper and lower respiratory specimens during the acute phase of infection. Negative results do not preclude SARS-CoV-2 infection, do not rule out co-infections with other pathogens, and should not be used as the sole basis for treatment or other patient management decisions. Negative results must be combined with clinical observations, patient history, and  epidemiological information. The expected result is Negative. Fact Sheet for Patients: SugarRoll.be Fact Sheet for Healthcare Providers: https://www.woods-mathews.com/ This test is not yet approved or cleared by the [place] FDA and  has been authorized for detection and/or diagnosis of SARS-CoV-2 by FDA under an Emergency Use Authorization (EUA). This EUA will remain  in effect (meaning this test can be used) for the duration of the COVID-19 declaration under Section 56 4(b)(1) of the Act, 21 U.S.C. section 360bbb-3(b)(1), unless the authorization is terminated or revoked sooner. Performed at [facility], [place] [address] [place], [place] [address]     Management plans discussed with the patient, family and they are in agreement.  CODE STATUS:     Code Status Orders  (From admission, onward)         Start     Ordered   02/09/20 1529  Do not attempt resuscitation (DNR)  Continuous    Question Answer Comment  In the event of cardiac or respiratory ARREST Do not call a "code blue"   In the event of cardiac or respiratory ARREST Do not perform Intubation, CPR, defibrillation or ACLS   In the event of cardiac or respiratory ARREST Use medication by any route, position, wound care, and other measures to relive pain and suffering. May use oxygen, suction and manual treatment of airway obstruction as needed for comfort.   Comments nurse may pronounce      02/09/20 1529         Code Status History    This patient has a current code status but no historical code status.   Advance Care Planning Activity    Advance Directive Documentation     Most Recent Value  Type of Advance Directive  Out of facility DNR (pink MOST or yellow form)  Pre-existing out of facility DNR order (yellow form or pink MOST form)  Pink MOST form placed in chart (order not valid for inpatient use)  "MOST" Form in Place?  --      TOTAL TIME TAKING CARE OF THIS PATIENT: 35 minutes.    Loletha Grayer M.D on 02/14/2020 at 9:22 AM  Between 7am to 6pm - Pager - [phone]  After 6pm go to [email or web address] - password [email or web address]  [facility]  CC: Primary care physician; [place] program

## 2021-06-01 ENCOUNTER — Emergency Department: Payer: Medicare (Managed Care)

## 2021-06-01 ENCOUNTER — Inpatient Hospital Stay: Payer: Medicare (Managed Care)

## 2021-06-01 ENCOUNTER — Encounter: Payer: Self-pay | Admitting: Emergency Medicine

## 2021-06-01 ENCOUNTER — Inpatient Hospital Stay
Admission: EM | Admit: 2021-06-01 | Discharge: 2021-06-03 | DRG: 872 | Disposition: A | Discharge status: Other Acute Inpatient Hospital | Payer: Medicare (Managed Care) | Attending: Family Medicine | Admitting: Family Medicine

## 2021-06-01 ENCOUNTER — Other Ambulatory Visit: Payer: Self-pay

## 2021-06-01 DIAGNOSIS — B967 Clostridium perfringens [C. perfringens] as the cause of diseases classified elsewhere: Secondary | ICD-10-CM | POA: Diagnosis present

## 2021-06-01 DIAGNOSIS — N12 Tubulo-interstitial nephritis, not specified as acute or chronic: Secondary | ICD-10-CM | POA: Diagnosis present

## 2021-06-01 DIAGNOSIS — N1831 Chronic kidney disease, stage 3a: Secondary | ICD-10-CM | POA: Diagnosis not present

## 2021-06-01 DIAGNOSIS — E871 Hypo-osmolality and hyponatremia: Secondary | ICD-10-CM | POA: Diagnosis present

## 2021-06-01 DIAGNOSIS — M79605 Pain in left leg: Secondary | ICD-10-CM | POA: Diagnosis present

## 2021-06-01 DIAGNOSIS — F39 Unspecified mood [affective] disorder: Secondary | ICD-10-CM | POA: Diagnosis present

## 2021-06-01 DIAGNOSIS — M6282 Rhabdomyolysis: Secondary | ICD-10-CM | POA: Diagnosis present

## 2021-06-01 DIAGNOSIS — Z888 Allergy status to other drugs, medicaments and biological substances status: Secondary | ICD-10-CM | POA: Diagnosis not present

## 2021-06-01 DIAGNOSIS — Z8249 Family history of ischemic heart disease and other diseases of the circulatory system: Secondary | ICD-10-CM

## 2021-06-01 DIAGNOSIS — R7401 Elevation of levels of liver transaminase levels: Secondary | ICD-10-CM | POA: Diagnosis present

## 2021-06-01 DIAGNOSIS — Z79890 Hormone replacement therapy (postmenopausal): Secondary | ICD-10-CM

## 2021-06-01 DIAGNOSIS — I10 Essential (primary) hypertension: Secondary | ICD-10-CM

## 2021-06-01 DIAGNOSIS — R6510 Systemic inflammatory response syndrome (SIRS) of non-infectious origin without acute organ dysfunction: Secondary | ICD-10-CM

## 2021-06-01 DIAGNOSIS — W19XXXA Unspecified fall, initial encounter: Secondary | ICD-10-CM | POA: Diagnosis present

## 2021-06-01 DIAGNOSIS — A419 Sepsis, unspecified organism: Secondary | ICD-10-CM | POA: Diagnosis not present

## 2021-06-01 DIAGNOSIS — N189 Chronic kidney disease, unspecified: Secondary | ICD-10-CM | POA: Diagnosis present

## 2021-06-01 DIAGNOSIS — N898 Other specified noninflammatory disorders of vagina: Secondary | ICD-10-CM | POA: Diagnosis present

## 2021-06-01 DIAGNOSIS — R531 Weakness: Secondary | ICD-10-CM

## 2021-06-01 DIAGNOSIS — E039 Hypothyroidism, unspecified: Secondary | ICD-10-CM | POA: Diagnosis present

## 2021-06-01 DIAGNOSIS — E876 Hypokalemia: Secondary | ICD-10-CM | POA: Diagnosis present

## 2021-06-01 DIAGNOSIS — N719 Inflammatory disease of uterus, unspecified: Secondary | ICD-10-CM | POA: Diagnosis present

## 2021-06-01 DIAGNOSIS — Z87891 Personal history of nicotine dependence: Secondary | ICD-10-CM

## 2021-06-01 DIAGNOSIS — T796XXD Traumatic ischemia of muscle, subsequent encounter: Secondary | ICD-10-CM | POA: Diagnosis not present

## 2021-06-01 DIAGNOSIS — E119 Type 2 diabetes mellitus without complications: Secondary | ICD-10-CM | POA: Diagnosis present

## 2021-06-01 DIAGNOSIS — Z79899 Other long term (current) drug therapy: Secondary | ICD-10-CM

## 2021-06-01 DIAGNOSIS — Z6841 Body Mass Index (BMI) 40.0 and over, adult: Secondary | ICD-10-CM

## 2021-06-01 DIAGNOSIS — E669 Obesity, unspecified: Secondary | ICD-10-CM | POA: Diagnosis present

## 2021-06-01 DIAGNOSIS — N939 Abnormal uterine and vaginal bleeding, unspecified: Secondary | ICD-10-CM | POA: Diagnosis present

## 2021-06-01 DIAGNOSIS — R296 Repeated falls: Secondary | ICD-10-CM

## 2021-06-01 DIAGNOSIS — R7881 Bacteremia: Principal | ICD-10-CM | POA: Diagnosis present

## 2021-06-01 DIAGNOSIS — M25512 Pain in left shoulder: Secondary | ICD-10-CM | POA: Diagnosis present

## 2021-06-01 DIAGNOSIS — F0390 Unspecified dementia without behavioral disturbance: Secondary | ICD-10-CM | POA: Diagnosis present

## 2021-06-01 DIAGNOSIS — N858 Other specified noninflammatory disorders of uterus: Secondary | ICD-10-CM

## 2021-06-01 DIAGNOSIS — R000 Tachycardia, unspecified: Secondary | ICD-10-CM | POA: Diagnosis present

## 2021-06-01 DIAGNOSIS — Z66 Do not resuscitate: Secondary | ICD-10-CM | POA: Diagnosis present

## 2021-06-01 DIAGNOSIS — Z7982 Long term (current) use of aspirin: Secondary | ICD-10-CM

## 2021-06-01 DIAGNOSIS — Z20822 Contact with and (suspected) exposure to covid-19: Secondary | ICD-10-CM | POA: Diagnosis present

## 2021-06-01 DIAGNOSIS — E1122 Type 2 diabetes mellitus with diabetic chronic kidney disease: Secondary | ICD-10-CM

## 2021-06-01 DIAGNOSIS — D509 Iron deficiency anemia, unspecified: Secondary | ICD-10-CM | POA: Diagnosis present

## 2021-06-01 HISTORY — DX: Unspecified dementia, unspecified severity, without behavioral disturbance, psychotic disturbance, mood disturbance, and anxiety: F03.90

## 2021-06-01 LAB — RESP PANEL BY RT-PCR (FLU A&B, COVID) ARPGX2
Influenza A by PCR: NEGATIVE
Influenza B by PCR: NEGATIVE
SARS Coronavirus 2 by RT PCR: NEGATIVE

## 2021-06-01 LAB — URINALYSIS, COMPLETE (UACMP) WITH MICROSCOPIC
Bilirubin Urine: NEGATIVE
Glucose, UA: NEGATIVE mg/dL
Ketones, ur: NEGATIVE mg/dL
Nitrite: NEGATIVE
Protein, ur: 30 mg/dL — AB
Specific Gravity, Urine: 1.011 (ref 1.005–1.030)
pH: 5 (ref 5.0–8.0)

## 2021-06-01 LAB — CBC WITH DIFFERENTIAL/PLATELET
Abs Immature Granulocytes: 2.08 10*3/uL — ABNORMAL HIGH (ref 0.00–0.07)
Basophils Absolute: 0.1 10*3/uL (ref 0.0–0.1)
Basophils Relative: 0 %
Eosinophils Absolute: 0.2 10*3/uL (ref 0.0–0.5)
Eosinophils Relative: 1 %
HCT: 31.8 % — ABNORMAL LOW (ref 36.0–46.0)
Hemoglobin: 10.2 g/dL — ABNORMAL LOW (ref 12.0–15.0)
Immature Granulocytes: 8 %
Lymphocytes Relative: 2 %
Lymphs Abs: 0.6 10*3/uL — ABNORMAL LOW (ref 0.7–4.0)
MCH: 24.2 pg — ABNORMAL LOW (ref 26.0–34.0)
MCHC: 32.1 g/dL (ref 30.0–36.0)
MCV: 75.4 fL — ABNORMAL LOW (ref 80.0–100.0)
Monocytes Absolute: 0.9 10*3/uL (ref 0.1–1.0)
Monocytes Relative: 4 %
Neutro Abs: 21.4 10*3/uL — ABNORMAL HIGH (ref 1.7–7.7)
Neutrophils Relative %: 85 %
Platelets: 378 10*3/uL (ref 150–400)
RBC: 4.22 MIL/uL (ref 3.87–5.11)
RDW: 15 % (ref 11.5–15.5)
Smear Review: NORMAL
WBC: 25.3 10*3/uL — ABNORMAL HIGH (ref 4.0–10.5)
nRBC: 0 % (ref 0.0–0.2)

## 2021-06-01 LAB — COMPREHENSIVE METABOLIC PANEL
ALT: 25 U/L (ref 0–44)
AST: 47 U/L — ABNORMAL HIGH (ref 15–41)
Albumin: 3 g/dL — ABNORMAL LOW (ref 3.5–5.0)
Alkaline Phosphatase: 160 U/L — ABNORMAL HIGH (ref 38–126)
Anion gap: 14 (ref 5–15)
BUN: 20 mg/dL (ref 8–23)
CO2: 21 mmol/L — ABNORMAL LOW (ref 22–32)
Calcium: 8.1 mg/dL — ABNORMAL LOW (ref 8.9–10.3)
Chloride: 98 mmol/L (ref 98–111)
Creatinine, Ser: 1.2 mg/dL — ABNORMAL HIGH (ref 0.44–1.00)
GFR, Estimated: 47 mL/min — ABNORMAL LOW (ref >60)
Glucose, Bld: 185 mg/dL — ABNORMAL HIGH (ref 70–99)
Potassium: 3 mmol/L — ABNORMAL LOW (ref 3.5–5.1)
Sodium: 133 mmol/L — ABNORMAL LOW (ref 135–145)
Total Bilirubin: 1.3 mg/dL — ABNORMAL HIGH (ref 0.3–1.2)
Total Protein: 7 g/dL (ref 6.5–8.1)

## 2021-06-01 LAB — CBG MONITORING, ED
Glucose-Capillary: 152 mg/dL — ABNORMAL HIGH (ref 70–99)
Glucose-Capillary: 186 mg/dL — ABNORMAL HIGH (ref 70–99)
Glucose-Capillary: 187 mg/dL — ABNORMAL HIGH (ref 70–99)
Glucose-Capillary: 206 mg/dL — ABNORMAL HIGH (ref 70–99)

## 2021-06-01 LAB — LACTIC ACID, PLASMA
Lactic Acid, Venous: 3.7 mmol/L (ref 0.5–1.9)
Lactic Acid, Venous: 2.3 mmol/L (ref 0.5–1.9)

## 2021-06-01 LAB — TROPONIN I (HIGH SENSITIVITY)
Troponin I (High Sensitivity): 33 ng/L — ABNORMAL HIGH (ref <18)
Troponin I (High Sensitivity): 34 ng/L — ABNORMAL HIGH (ref <18)

## 2021-06-01 LAB — HEMOGLOBIN A1C
Hgb A1c MFr Bld: 7.5 % — ABNORMAL HIGH (ref 4.8–5.6)
Mean Plasma Glucose: 169 mg/dL

## 2021-06-01 LAB — CK: Total CK: 935 U/L — ABNORMAL HIGH (ref 38–234)

## 2021-06-01 LAB — MAGNESIUM: Magnesium: 1.6 mg/dL — ABNORMAL LOW (ref 1.7–2.4)

## 2021-06-01 LAB — PROCALCITONIN: Procalcitonin: 53.76 ng/mL

## 2021-06-01 MED ORDER — SODIUM CHLORIDE 0.9 % IV BOLUS
1000.0000 mL | Freq: Once | INTRAVENOUS | Status: AC
  Administered 2021-06-01: 1000 mL via INTRAVENOUS

## 2021-06-01 MED ORDER — METOPROLOL TARTRATE 5 MG/5ML IV SOLN
5.0000 mg | Freq: Once | INTRAVENOUS | Status: AC
  Administered 2021-06-01: 5 mg via INTRAVENOUS
  Filled 2021-06-01: qty 5

## 2021-06-01 MED ORDER — VANCOMYCIN HCL IN DEXTROSE 1-5 GM/200ML-% IV SOLN: 1000.0000 mg | Freq: Once | INTRAVENOUS | Status: DC

## 2021-06-01 MED ORDER — SODIUM CHLORIDE 0.9 % IV SOLN
2.0000 g | Freq: Once | INTRAVENOUS | Status: AC
  Administered 2021-06-01: 2 g via INTRAVENOUS
  Filled 2021-06-01: qty 2

## 2021-06-01 MED ORDER — ACETAMINOPHEN 650 MG RE SUPP: 650.0000 mg | Freq: Once | RECTAL | Status: DC

## 2021-06-01 MED ORDER — SODIUM CHLORIDE 0.9 % IV SOLN: 1.0000 g | Freq: Two times a day (BID) | INTRAVENOUS | Status: DC

## 2021-06-01 MED ORDER — SODIUM CHLORIDE 0.9 % IV SOLN
2.0000 g | INTRAVENOUS | Status: DC
  Administered 2021-06-01: 2 g via INTRAVENOUS
  Filled 2021-06-01 (×4): qty 20

## 2021-06-01 MED ORDER — PANTOPRAZOLE SODIUM 40 MG PO TBEC
40.0000 mg | DELAYED_RELEASE_TABLET | Freq: Every day | ORAL | Status: DC
Start: 2021-06-01 — End: 2021-06-03
  Administered 2021-06-02 – 2021-06-03 (×2): 40 mg via ORAL
  Filled 2021-06-01 (×2): qty 1

## 2021-06-01 MED ORDER — METRONIDAZOLE 500 MG/100ML IV SOLN
500.0000 mg | Freq: Once | INTRAVENOUS | Status: AC
  Administered 2021-06-01: 500 mg via INTRAVENOUS
  Filled 2021-06-01: qty 100

## 2021-06-01 MED ORDER — LACTATED RINGERS IV BOLUS
1000.0000 mL | Freq: Once | INTRAVENOUS | Status: AC
  Administered 2021-06-01: 1000 mL via INTRAVENOUS

## 2021-06-01 MED ORDER — LEVOTHYROXINE SODIUM 50 MCG PO TABS
50.0000 ug | ORAL_TABLET | Freq: Every day | ORAL | Status: DC
  Administered 2021-06-02 – 2021-06-03 (×2): 50 ug via ORAL
  Filled 2021-06-01 (×2): qty 1

## 2021-06-01 MED ORDER — METOPROLOL SUCCINATE ER 25 MG PO TB24
12.5000 mg | ORAL_TABLET | Freq: Every day | ORAL | Status: DC
  Filled 2021-06-01: qty 0.5

## 2021-06-01 MED ORDER — METRONIDAZOLE 500 MG/100ML IV SOLN
500.0000 mg | Freq: Three times a day (TID) | INTRAVENOUS | Status: DC
  Administered 2021-06-01 – 2021-06-03 (×6): 500 mg via INTRAVENOUS
  Filled 2021-06-01 (×9): qty 100

## 2021-06-01 MED ORDER — POTASSIUM CHLORIDE CRYS ER 20 MEQ PO TBCR: 40.0000 meq | EXTENDED_RELEASE_TABLET | Freq: Once | ORAL | Status: DC

## 2021-06-01 MED ORDER — ASPIRIN EC 81 MG PO TBEC
81.0000 mg | DELAYED_RELEASE_TABLET | Freq: Every day | ORAL | Status: DC
  Administered 2021-06-02 – 2021-06-03 (×2): 81 mg via ORAL
  Filled 2021-06-01 (×2): qty 1

## 2021-06-01 MED ORDER — INSULIN ASPART 100 UNIT/ML IJ SOLN
0.0000 [IU] | INTRAMUSCULAR | Status: DC
  Administered 2021-06-01 (×2): 3 [IU] via SUBCUTANEOUS
  Administered 2021-06-01: 5 [IU] via SUBCUTANEOUS
  Administered 2021-06-02: 2 [IU] via SUBCUTANEOUS
  Administered 2021-06-02 (×3): 3 [IU] via SUBCUTANEOUS
  Administered 2021-06-02: 2 [IU] via SUBCUTANEOUS
  Administered 2021-06-02 – 2021-06-03 (×2): 3 [IU] via SUBCUTANEOUS
  Administered 2021-06-03: 5 [IU] via SUBCUTANEOUS
  Administered 2021-06-03: 2 [IU] via SUBCUTANEOUS
  Administered 2021-06-03: 5 [IU] via SUBCUTANEOUS
  Administered 2021-06-03: 3 [IU] via SUBCUTANEOUS
  Filled 2021-06-01 (×14): qty 1

## 2021-06-01 MED ORDER — ACETAMINOPHEN 325 MG PO TABS
650.0000 mg | ORAL_TABLET | Freq: Four times a day (QID) | ORAL | Status: DC | PRN
  Administered 2021-06-01 (×2): 650 mg via ORAL
  Filled 2021-06-01 (×2): qty 2

## 2021-06-01 MED ORDER — VANCOMYCIN HCL 1500 MG/300ML IV SOLN
1500.0000 mg | Freq: Once | INTRAVENOUS | Status: AC
  Administered 2021-06-01: 1500 mg via INTRAVENOUS
  Filled 2021-06-01: qty 300

## 2021-06-01 MED ORDER — POTASSIUM CHLORIDE 10 MEQ/100ML IV SOLN: 10.0000 meq | Freq: Once | INTRAVENOUS | Status: DC

## 2021-06-01 MED ORDER — POTASSIUM CHLORIDE 10 MEQ/100ML IV SOLN
10.0000 meq | INTRAVENOUS | Status: AC
  Administered 2021-06-01 (×2): 10 meq via INTRAVENOUS
  Filled 2021-06-01: qty 100

## 2021-06-01 MED ORDER — VANCOMYCIN HCL IN DEXTROSE 1-5 GM/200ML-% IV SOLN
1000.0000 mg | Freq: Once | INTRAVENOUS | Status: AC
  Administered 2021-06-01: 1000 mg via INTRAVENOUS
  Filled 2021-06-01: qty 200

## 2021-06-01 NOTE — ED Provider Notes (Addendum)
[facility] Emergency Department Provider Note  ____________________________________________   Event Date/Time   First MD Initiated Contact with Patient 06/01/21 0800     (approximate)  I have reviewed the triage vital signs and the nursing notes.   HISTORY  Chief Complaint Fall and Code Sepsis    HPI Alexis Mclaughlin is a 75 y.o. female with diabetes, hypertension who comes in for fall.  Patient was reportedly recently started on nitrofurantoin for a UTI yesterday.  Is only have 1-2 doses.  At some point tonight she had a fall.  Unclear when.  Patient states that she just woke up on the floor of her living room.  She does not remember trying to get out of bed or how she got there.  Unclear how long she was down for.  She was able to push her life alert button.  She denies any pain from the fall is able to move all her extremities well.  No abdominal pain, no chest pain, nor shortness of breath.  However she had some low-grade temperatures and was noted to be tachycardic.  Patient does report some lower back pain.  Unclear how long this is been going on for.  She also reports a little bit of neck pain but she states that her usual arthritis pain.  She states that she ambulates with a walker at home.  She lives by herself.          Past Medical History:  Diagnosis Date   Diabetes mellitus without complication ([place])    Hypertension    Hypothyroidism     Patient Active Problem List   Diagnosis Date Noted   Diarrhea    Acute kidney injury ([place]) 02/10/2020   AKI (acute kidney injury) ([place]) 02/09/2020   Hypotension    Acute cystitis without hematuria    Chronic right shoulder pain    Hypothyroidism    Weakness    Type 2 diabetes mellitus with chronic kidney disease on chronic dialysis, without long-term current use of insulin [facility])     Past Surgical History:  Procedure Laterality Date   BREAST BIOPSY Right 09/02/2018   affirm stereo/path pending     Prior to Admission medications   Medication Sig Start Date End Date Taking? Authorizing Provider  acetaminophen (TYLENOL) 325 MG tablet Take 2 tablets (650 mg total) by mouth every 6 (six) hours as needed for mild pain (or Fever >/= 101). 02/14/20   Loletha Grayer, MD  aspirin EC 81 MG tablet Take 81 mg by mouth daily.    [provider]  diclofenac Sodium (VOLTAREN) 1 % GEL Apply to right shoulder and neck three times a day 02/14/20   Loletha Grayer, MD  DULoxetine (CYMBALTA) 60 MG capsule Take 60 mg by mouth daily.    [provider]  levothyroxine (SYNTHROID) 50 MCG tablet Take 50 mcg by mouth daily before breakfast.    [provider]  loperamide (IMODIUM) 2 MG capsule Take 1 capsule (2 mg total) by mouth every 8 (eight) hours as needed for diarrhea or loose stools. 02/14/20   Loletha Grayer, MD  midodrine (PROAMATINE) 5 MG tablet Take 1 tablet (5 mg total) by mouth 3 (three) times daily with meals. 02/14/20   Loletha Grayer, MD  Multiple Vitamin (MULTIVITAMIN WITH MINERALS) TABS tablet Take 1 tablet by mouth daily. 02/14/20   Loletha Grayer, MD  QUEtiapine (SEROQUEL) 25 MG tablet Take 0.5 tablets (12.5 mg total) by mouth at bedtime as needed (insomnia). 02/14/20  Loletha Grayer, MD    Allergies Patient has no known allergies.  Family History  Problem Relation Age of Onset   CAD Mother    CAD Father     Social History Social History   Tobacco Use   Smoking status: Former    Pack years: 0.00   Smokeless tobacco: Never  Vaping Use   Vaping Use: Never used  Substance Use Topics   Alcohol use: Not Currently   Drug use: Never      Review of Systems Constitutional: No fever/chills, positive fall Eyes: No visual changes. ENT: No sore throat. Cardiovascular: Denies chest pain. Respiratory: Denies shortness of breath. Gastrointestinal: No abdominal pain.  No nausea, no vomiting.  No diarrhea.  No constipation. Genitourinary: Negative for  dysuria. Musculoskeletal: Positive back and neck pain Skin: Negative for rash. Neurological: Negative for headaches, focal weakness or numbness. All other ROS negative ____________________________________________   PHYSICAL EXAM:  VITAL SIGNS: ED Triage Vitals [06/01/21 0758]  Enc Vitals Group     BP      Pulse      Resp      Temp      Temp src      SpO2 98 %     Weight      Height      Head Circumference      Peak Flow      Pain Score      Pain Loc      Pain Edu?      Excl. in [place]?     Constitutional: Alert and oriented. Well appearing and in no acute distress. Eyes: Conjunctivae are normal. EOMI. Head: Atraumatic. Nose: No congestion/rhinnorhea. Mouth/Throat: Mucous membranes are moist.   Neck: No stridor. Trachea Midline. FROM Cardiovascular: Tachycardic, regular rhythm. Grossly normal heart sounds.  Good peripheral circulation.  No chest wall tenderness Respiratory: Normal respiratory effort.  No retractions. Lungs CTAB. Gastrointestinal: Soft and nontender. No distention. No abdominal bruits.  Musculoskeletal: No lower extremity tenderness nor edema.  No joint effusions.  Able to lift up both arms and legs.  No tenderness throughout any of the extremities Neurologic:  Normal speech and language. No gross focal neurologic deficits are appreciated.  Skin:  Skin is warm, dry and intact. No rash noted. Psychiatric: Mood and affect are normal. Speech and behavior are normal. GU: Deferred  Back: Some mild lower back tenderness, unclear if this is new or old ____________________________________________   LABS (all labs ordered are listed, but only abnormal results are displayed)  Labs Reviewed  CBC WITH DIFFERENTIAL/PLATELET - Abnormal; Notable for the following components:      Result Value   WBC 25.3 (*)    Hemoglobin 10.2 (*)    HCT 31.8 (*)    MCV 75.4 (*)    MCH 24.2 (*)    Neutro Abs 21.4 (*)    Lymphs Abs 0.6 (*)    Abs Immature Granulocytes 2.08 (*)     All other components within normal limits  COMPREHENSIVE METABOLIC PANEL - Abnormal; Notable for the following components:   Sodium 133 (*)    Potassium 3.0 (*)    CO2 21 (*)    Glucose, Bld 185 (*)    Creatinine, Ser 1.20 (*)    Calcium 8.1 (*)    Albumin 3.0 (*)    AST 47 (*)    Alkaline Phosphatase 160 (*)    Total Bilirubin 1.3 (*)    GFR, Estimated 47 (*)    All other  components within normal limits  CK - Abnormal; Notable for the following components:   Total CK 935 (*)    All other components within normal limits  LACTIC ACID, PLASMA - Abnormal; Notable for the following components:   Lactic Acid, Venous 3.7 (*)    All other components within normal limits  TROPONIN I (HIGH SENSITIVITY) - Abnormal; Notable for the following components:   Troponin I (High Sensitivity) 33 (*)    All other components within normal limits  RESP PANEL BY RT-PCR (FLU A&B, COVID) ARPGX2  CULTURE, BLOOD (ROUTINE X 2)  CULTURE, BLOOD (ROUTINE X 2)  URINALYSIS, COMPLETE (UACMP) WITH MICROSCOPIC  LACTIC ACID, PLASMA  PROCALCITONIN   ____________________________________________   ED ECG REPORT I, Vanessa [place], the attending physician, personally viewed and interpreted this ECG.  Sinus tachycardia rate of 122, no ST elevation, no T wave versions, normal intervals ____________________________________________  RADIOLOGY Robert Bellow, personally viewed and evaluated these images (plain radiographs) as part of my medical decision making, as well as reviewing the written report by the radiologist.  ED MD interpretation:  no PNA   Official radiology report(s): CT Head Wo Contrast  Result Date: 06/01/2021 CLINICAL DATA:  Trauma.  Head trauma EXAM: CT HEAD WITHOUT CONTRAST CT CERVICAL SPINE WITHOUT CONTRAST TECHNIQUE: Multidetector CT imaging of the head and cervical spine was performed following the standard protocol without intravenous contrast. Multiplanar CT image reconstructions of the  cervical spine were also generated. COMPARISON:  None. FINDINGS: CT HEAD FINDINGS Brain: No evidence of acute infarction, hemorrhage, hydrocephalus,or mass lesion/mass effect. Vascular: Negative Skull: Negative for fracture Sinuses/Orbits: No acute finding. CT CERVICAL SPINE FINDINGS Alignment: Reversal of cervical lordosis.  No traumatic malalignment Skull base and vertebrae: No evidence of acute fracture. There is generalized motion artifact. Soft tissues and spinal canal: No prevertebral fluid or swelling. No visible canal hematoma. Disc levels:  Ordinary degenerative changes for age Upper chest: No visible injury IMPRESSION: 1. Negative for intracranial hemorrhage. 2. Motion degraded cervical spine CT without fracture. Electronically Signed   By: Monte Fantasia M.D.   On: 06/01/2021 08:52   CT Cervical Spine Wo Contrast  Result Date: 06/01/2021 CLINICAL DATA:  Trauma.  Head trauma EXAM: CT HEAD WITHOUT CONTRAST CT CERVICAL SPINE WITHOUT CONTRAST TECHNIQUE: Multidetector CT imaging of the head and cervical spine was performed following the standard protocol without intravenous contrast. Multiplanar CT image reconstructions of the cervical spine were also generated. COMPARISON:  None. FINDINGS: CT HEAD FINDINGS Brain: No evidence of acute infarction, hemorrhage, hydrocephalus,or mass lesion/mass effect. Vascular: Negative Skull: Negative for fracture Sinuses/Orbits: No acute finding. CT CERVICAL SPINE FINDINGS Alignment: Reversal of cervical lordosis.  No traumatic malalignment Skull base and vertebrae: No evidence of acute fracture. There is generalized motion artifact. Soft tissues and spinal canal: No prevertebral fluid or swelling. No visible canal hematoma. Disc levels:  Ordinary degenerative changes for age Upper chest: No visible injury IMPRESSION: 1. Negative for intracranial hemorrhage. 2. Motion degraded cervical spine CT without fracture. Electronically Signed   By: Monte Fantasia M.D.   On:  06/01/2021 08:52   DG Chest Portable 1 View  Result Date: 06/01/2021 CLINICAL DATA:  Pain following fall EXAM: PORTABLE CHEST 1 VIEW COMPARISON:  None. FINDINGS: There is elevation of the right hemidiaphragm. There is no edema or airspace opacity. Heart is mildly enlarged with pulmonary vascularity normal. No adenopathy. No bone lesions. IMPRESSION: Elevation of right hemidiaphragm. No edema or airspace opacity. Mild cardiac enlargement. Electronically Signed  By: Lowella Grip III M.D.   On: 06/01/2021 08:36   CT Renal Stone Study  Result Date: 06/01/2021 CLINICAL DATA:  White pain EXAM: CT ABDOMEN AND PELVIS WITHOUT CONTRAST TECHNIQUE: Multidetector CT imaging of the abdomen and pelvis was performed following the standard protocol without oral or IV contrast. COMPARISON:  None. FINDINGS: Lower chest: There is mild bibasilar atelectasis. No lung base edema or consolidation. Foci of coronary artery calcification noted. There is a fairly small hiatal hernia. Hepatobiliary: No focal liver lesions are appreciable on this noncontrast enhanced study. There is cholelithiasis. Questionable mild gallbladder wall thickening. No pericholecystic fluid by CT. No biliary duct dilatation. Pancreas: No pancreatic mass or inflammatory focus. Spleen: No splenic lesions are evident. Adrenals/Urinary Tract: Mild adrenal hypertrophy on each side. No well-defined renal mass. There is an apparent cyst in the lateral mid right kidney measuring 1.5 x 1.5 cm. There is an extrarenal pelvis on the right, an anatomic variant. There is mild fullness of the right renal collecting system. No similar fullness on the left. There is no evident intrarenal calculus. There is prominence of the right ureter. There is apparent impression in the mid right ureter due to markedly enlarged uterus with suspected infection in the mid abdomen and pelvis extending toward the right. No ureterectasis on the left. No ureteral calculus on either side.  Urinary bladder is midline with wall thickness within normal limits. Stomach/Bowel: There is no appreciable bowel wall or mesenteric thickening. No evident bowel obstruction. Terminal ileum appears normal. Appendix appears normal. There is no appreciable free air or portal venous air. Vascular/Lymphatic: There is no abdominal aortic aneurysm. There are occasional foci aortic atherosclerosis. There is no appreciable adenopathy in the abdomen pelvis. Reproductive: The uterus is anteverted. The uterus is diffusely enlarged, measuring 22.1 x 12.5 x 12.2 cm. There is extensive fluid and air throughout the uterus with an air-fluid level consistent with intrauterine abscess/emphysematous pyometritis. Note that there are foci of air within the wall of this enlarged uterus. There is no adnexal region mass. Other: No ascites in the abdomen or pelvis. No abscess apart from the changes within the uterus noted. Musculoskeletal: There are no blastic or lytic bone lesions. Spinal stenosis is noted L3-4 and L4-5 due to disc protrusion and bony hypertrophy. No blastic or lytic bone lesions are evident. No abdominal wall or intramuscular lesions are evident. IMPRESSION: 1. Markedly enlarged uterus with extensive fluid and air within this enlarged uterus consistent with intrauterine abscess. Scattered foci of air elsewhere throughout the uterus and in the uterine wall. 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 Gynecologic assessment urged in this regard. 2. Fullness of the right renal collecting system and proximal to mid ureter likely due to compression of the right ureter by a markedly enlarged and apparently infected uterus. No renal or ureteral calculi on either side. Urinary bladder wall thickness normal. 3.  Cholelithiasis.  Suspect mild gallbladder wall thickening. 4. No bowel obstruction. No abscess apart from the changes in the uterus. Appendix appears normal. 5.  Small hiatal  hernia. 6. Spinal stenosis L3-4 and L4-5 due to disc protrusion and bony hypertrophy. 7. Aortic Atherosclerosis (ICD10-I70.0). Foci of coronary artery calcification also noted. 8. There is a degree of adrenal hypertrophy bilaterally, a finding of uncertain significance. Electronically Signed   By: Lowella Grip III M.D.   On: 06/01/2021 09:00    ____________________________________________   PROCEDURES  Procedure(s) performed (including Critical Care):  .1-3 Lead EKG Interpretation  Date/Time: 06/01/2021 8:07 AM Performed by: Vanessa Fernley, MD Authorized by: Vanessa Maxeys, MD     Interpretation: abnormal     ECG rate:  120s   ECG rate assessment: tachycardic     Rhythm: sinus tachycardia     Ectopy: none     Conduction: normal   .Critical Care  Date/Time: 06/01/2021 11:04 AM Performed by: Vanessa Blanchard, MD Authorized by: Vanessa [place], MD   Critical care provider statement:    Critical care time (minutes):  45   Critical care was necessary to treat or prevent imminent or life-threatening deterioration of the following conditions:  Sepsis   Critical care was time spent personally by me on the following activities:  Discussions with consultants, evaluation of patient's response to treatment, examination of patient, ordering and performing treatments and interventions, ordering and review of laboratory studies, ordering and review of radiographic studies, pulse oximetry, re-evaluation of patient's condition, obtaining history from patient or surrogate and review of old charts   ____________________________________________   INITIAL IMPRESSION / ASSESSMENT AND PLAN / ED COURSE  Alexis Mclaughlin was evaluated in Emergency Department on 06/01/2021 for the symptoms described in the history of present illness. She was evaluated in the context of the global COVID-19 pandemic, which necessitated consideration that the patient might be at risk for infection with the SARS-CoV-2 virus that  causes COVID-19. Institutional protocols and algorithms that pertain to the evaluation of patients at risk for COVID-19 are in a state of rapid change based on information released by regulatory bodies including the CDC and federal and state organizations. These policies and algorithms were followed during the patient's care in the ED.     Patient comes in with sinus tachycardia after a fall.  This could be secondary to dehydration, rhabdo, electrolyte abnormalities, AKI.  Will get labs to further evaluate.  Although patient was started on treatment for UTI recently therefore consider sepsis and will get blood cultures, lactate.  We will start fluid resuscitation.  Unclear source at this time and does not meet SIRS criteria other than tachycardia so we will continue to monitor for antibiotics.  Given patient did have this fall with unclear if she hit her head or unclear how she got onto the ground will get CT head, CT cervical to evaluate for intracranial, cervical fractures.  Also reporting some back pain but is unclear if this is new or old but given concern for potential UTI consider kidney stone as a possible source and will get CT imaging.  Labs consistent with significantly elevated white count.  Sepsis alert called.  CT scan showed a source of a uterine abscess.  Patient was started on broad-spectrum antibiotics.  Lactate was elevated so patient was started on 3 L of fluid for her tachycardia.  Patient already got 500 cc of fluid from EMS for a total of 30 mg/kg    Potassium was slightly low so we will give some IV potassium.  Discussed with Dr. Glennon Mac from OB/GYN who come down to see patient.  At this time patient does not need pressors but will continue to closely monitor.  11:04 AM after fluids, hr is downtrending, lactate downtrending. Does not need pressors.         ____________________________________________   FINAL CLINICAL IMPRESSION(S) / ED DIAGNOSES   Final diagnoses:   Fall, initial encounter  Sepsis, due to unspecified organism, unspecified whether acute organ dysfunction present [facility])  Uterine abscess      MEDICATIONS GIVEN  DURING THIS VISIT:  Medications  ceFEPIme (MAXIPIME) 2 g in sodium chloride 0.9 % 100 mL IVPB (2 g Intravenous New Bag/Given 06/01/21 0917)  metroNIDAZOLE (FLAGYL) IVPB 500 mg (has no administration in time range)  sodium chloride 0.9 % bolus 1,000 mL (has no administration in time range)  vancomycin (VANCOCIN) IVPB 1000 mg/200 mL premix (has no administration in time range)    Followed by  vancomycin (VANCOREADY) IVPB 1500 mg/300 mL (has no administration in time range)  lactated ringers bolus 1,000 mL (has no administration in time range)  potassium chloride 10 mEq in 100 mL IVPB (has no administration in time range)  sodium chloride 0.9 % bolus 1,000 mL (1,000 mLs Intravenous New Bag/Given 06/01/21 0810)     ED Discharge Orders     None        Note:  This document was prepared using Dragon voice recognition software and may include unintentional dictation errors.    Vanessa Adrian, MD 06/01/21 [id number]    Vanessa [place], MD 06/01/21 1104    Vanessa Hudson, MD 06/01/21 [phone]

## 2021-06-01 NOTE — ED Notes (Signed)
Pt placed on 2L via [place] at this time . 

## 2021-06-01 NOTE — ED Notes (Signed)
Request made for transport  

## 2021-06-01 NOTE — ED Notes (Signed)
Dr Jackson at bedside at this time.

## 2021-06-01 NOTE — ED Notes (Signed)
This RN discussed with Dr. Francine Graven regarding patient becoming more lethargic since arriving to ED, also discussed new requirements for O2. Per Dr. Francine Graven hold toprol, no orders yet regarding her new O2 requirement.

## 2021-06-01 NOTE — ED Notes (Signed)
This RN to bedside, pt resting in bed with daughter at bedside. Pt's daughter requesting check for hemorrhoids and vaginal check. States possibly last week or week before had blood in her brief. Pt denies noting blood in stools at this time. Pt denies further needs, family member at bedside. Will speak with EDP regarding.

## 2021-06-01 NOTE — ED Notes (Signed)
Patient order for tylenol and passing of swallow study discussed with Dr. Glennon Mac at this time. See orders. Tylenol to be given for fever per [facility].

## 2021-06-01 NOTE — ED Notes (Signed)
Patient and family updated on POC

## 2021-06-01 NOTE — Consult Note (Signed)
PHARMACY -  BRIEF ANTIBIOTIC NOTE   Pharmacy has received consult(s) for vancomycin and cefepime from an ED provider.  The patient's profile has been reviewed for ht/wt/allergies/indication/available labs.    One time order(s) placed for cefemipe 2 g and vancomycin 2500 mg (ordered as 1000 mg followed by 1500 mg)  Further antibiotics/pharmacy consults should be ordered by admitting physician if indicated.                       Thank you, Darnelle Bos, PharmD 06/01/2021  9:16 AM

## 2021-06-01 NOTE — ED Notes (Signed)
Patient is resting comfortably. Patient and family at bedside updated on POC. Patient continues to wait on inpatient bed assignment. Patient denies needs at this time.

## 2021-06-01 NOTE — ED Notes (Signed)
This RN spoke with Dr. Francine Graven regarding patient's HR, VORB for 650 rectal tylenol from Dr. Francine Graven.

## 2021-06-01 NOTE — Sepsis Progress Note (Signed)
Sepsis protocol being followed by eLink 

## 2021-06-01 NOTE — ED Notes (Signed)
Informed RN bed assigned 

## 2021-06-01 NOTE — ED Notes (Signed)
Flagyl out of pyxis in ED. Med requested from pharmacy.

## 2021-06-01 NOTE — ED Notes (Signed)
Pt repositioned in bed, granddaughter out in hallway asking if she could titrate patient's O2, this RN explained she could not. Pt's granddaughter states understanding at this time.

## 2021-06-01 NOTE — ED Notes (Signed)
EDP at bedside to update patient/family.

## 2021-06-01 NOTE — Consult Note (Signed)
CODE SEPSIS - PHARMACY COMMUNICATION  **Broad Spectrum Antibiotics should be administered within 1 hour of Sepsis diagnosis**  Time Code Sepsis Called/Page Received: 0907  Antibiotics Ordered: 0907  Time of 1st antibiotic administration: 0917  Additional action taken by pharmacy: N/A  If necessary, Name of Provider/Nurse Contacted: N/A    Darnelle Bos ,PharmD Clinical Pharmacist  06/01/2021  9:14 AM

## 2021-06-01 NOTE — ED Notes (Signed)
Patient's granddaughter requesting change to patient's NPO status. This RN contacted Dr. Glennon Mac to evaluate current diet order. Patient has passed all swallow evals. Awaiting confirmation from Dr. Glennon Mac at this time.

## 2021-06-01 NOTE — ED Notes (Signed)
Pt returned from CT at this time.  

## 2021-06-01 NOTE — Progress Notes (Signed)
PHARMACY - PHYSICIAN COMMUNICATION CRITICAL VALUE ALERT - BLOOD CULTURE IDENTIFICATION (BCID)  Alexis Mclaughlin is an 75 y.o. female who presented to [facility] on 06/01/2021 with a chief complaint of AMS and fall at home  Assessment:  6/10 blood cultures with GPR in anaerobic bottle of one bottle. BCID not performed since only in 1 of 4 bottles currently.  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  Ampicillin/sulbactam preferred if speciation confirms actinomyces  No results found for this or any previous visit.  Doreene Eland, PharmD, BCPS.   Work Cell: [phone] 06/01/2021 4:38 PM

## 2021-06-01 NOTE — Consult Note (Addendum)
[facility] Medical Consultation  [name] MCN:[id number] DOB: 12/25/1945 DOA: 06/01/2021 PCP: Patient, No Pcp Per (Inactive)   Requesting physician: Dr Glennon Mac Date of consultation: 06/01/21 Reason for consultation: Management of medical problems  Impression/Recommendations Principal Problem:   Sepsis ([place]) Active Problems:   Weakness   Hyponatremia   Hypokalemia   Chronic kidney disease   Elevated AST (SGOT)   Hypocalcemia   Uterine abscess   Rhabdomyolysis   Frequent falls    Sepsis ([place]) As evidenced by tachycardia, tachypnea, leukocytosis, lactic acidosis and ??  Intra uterine abscess /emphysematous pyometritis (imaging shows air-fluid levels in the uterus). Patient received 3 L IV fluid bolus and is on maintenance IV fluids She is on empiric IV antibiotic therapy with Flagyl, vancomycin and cefepime Follow-up results of blood cultures She is currently admitted to the GYN service and further treatment plan per GYN.    2.  Hyponatremia Secondary to poor oral intake Continue IV fluid hydration    3.  Hypokalemia Supplement potassium Check magnesium levels    4.  Rhabdomyolysis Related to frequent falls Continue IV fluid hydration Repeat CK levels in a.m.    5.  Urinary tract infection Patient has pyuria Continue empiric antibiotic therapy and follow-up results of urine culture    6.  Diabetes mellitus Hold oral hypoglycemic agents Blood sugar checks every 4 hours    7.  Hypothyroidism Continue Synthroid    8.  Dementia Patient may require increased assistance with activities of daily living   9.  Frequent falls Place patient on fall precautions She will need physical therapy evaluation once her acute illness has resolved  I will followup again tomorrow. Please contact me if I can be of assistance in the meanwhile. Thank you for this consultation.    HPI:  Chief Complaint: Weakness/fall  Most of the history was obtained  from patient's daughter over the phone and granddaughter at the bedside. HPI: Alexis Mclaughlin is a 75 y.o. female with medical history significant for dementia, diabetes mellitus, hypothyroidism and hypertension who presents to the emergency room via EMS for evaluation of a fall on the day of admission.  Her granddaughter states that she has been very weak and that she fell 1 day prior to her admission.  She normally ambulates with a rolling walker. Patient's daughter states that she has worsening confusion and was started on antibiotics for a UTI by her primary care provider.  Patient was started on Macrobid 1 day prior to her admission and has only taken 1 dose of the antibiotic. Her daughter states that she has noted increased vaginal discharge over the last couple of weeks and describes yellow foul-smelling discharge. I am unable to do review of systems on this patient due to her underlying dementia Labs show sodium 133, potassium 3.0, chloride 98, bicarb 21, glucose 185, BUN 20, creatinine 1.20, calcium 8.1, alkaline phosphatase 160, albumin 3.0, AST 47, ALT 25, total protein 7.0, total bilirubin 1.3, total CK9 35, troponin 33, lactic acid 3.7 << 2.3, procalcitonin 53.76, white count 25.3, hemoglobin 10.2, hematocrit 31.8, MCV 75.4, RDW 15.0 Respiratory viral panel is negative Chest x-ray reviewed by me shows elevation of right hemidiaphragm. No edema or airspace opacity. Mild cardiac enlargement. CT scan of abdomen and pelvis shows markedly enlarged uterus with extensive fluid and air within this enlarged uterus consistent with intrauterine abscess. Scattered foci of air elsewhere throughout the uterus and in the uterine wall. 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  Gynecologic assessment urged in this regard. Fullness of the right renal collecting system and proximal to mid ureter likely due to compression of the right ureter by a markedly enlarged  and apparently infected uterus. No renal or ureteral calculi on either side. Urinary bladder wall thickness normal. Cholelithiasis.  Suspect mild gallbladder wall thickening. No bowel obstruction. No abscess apart from the changes in the uterus. Appendix appears normal.Small hiatal hernia. Spinal stenosis L3-4 and L4-5 due to disc protrusion and bony hypertrophy. Aortic Atherosclerosis (ICD10-I70.0). Foci of coronary artery calcification also noted.There is a degree of adrenal hypertrophy bilaterally, a finding of uncertain significance. CT scan of the head without contrast shows no evidence of intracranial hemorrhage. Cervical spine CT is negative for fracture. Twelve-lead EKG reviewed by me shows sinus tachycardia   Review of Systems:  As per HPI otherwise all other systems reviewed and negative.   Past Medical History:  Diagnosis Date   Dementia ([place])    Diabetes mellitus without complication ([place])    Hypertension    Hypothyroidism    Past Surgical History:  Procedure Laterality Date   BREAST BIOPSY Right 09/02/2018   affirm stereo/path pending   Social History:  reports that she has quit smoking. She has never used smokeless tobacco. She reports previous alcohol use. She reports that she does not use drugs.  Allergies  Allergen Reactions   Ace Inhibitors    Family History  Problem Relation Age of Onset   CAD Mother    CAD Father     Prior to Admission medications   Medication Sig Start Date End Date Taking? Authorizing Provider  amLODipine (NORVASC) 10 MG tablet Take 1 tablet by mouth daily. 04/10/21  Yes [provider]  aspirin EC 81 MG tablet Take 81 mg by mouth daily.   Yes [provider]  camphor-menthol Timoteo Ace) lotion Apply 1 application topically 3 (three) times daily as needed for itching.   Yes [provider]  cholecalciferol (VITAMIN D3) 25 MCG (1000 UNIT) tablet Take 1,000 Units by mouth daily.   Yes [provider]   diclofenac Sodium (VOLTAREN) 1 % GEL Apply to right shoulder and neck three times a day 02/14/20  Yes Wieting, Richard, MD  docusate sodium (COLACE) 100 MG capsule Take 100 mg by mouth daily.   Yes [provider]  DULoxetine (CYMBALTA) 60 MG capsule Take 60 mg by mouth daily.   Yes [provider]  fluticasone (FLONASE) 50 MCG/ACT nasal spray Place 2 sprays into both nostrils daily.   Yes [provider]  levothyroxine (SYNTHROID) 50 MCG tablet Take 50 mcg by mouth daily before breakfast.   Yes [provider]  lidocaine (LIDODERM) 5 % Place 1 patch onto the skin daily. Remove & Discard patch within 12 hours or as directed by MD   Yes [provider]  LIPITOR 80 MG tablet Take 1 tablet by mouth daily. 04/11/21  Yes [provider]  loperamide (IMODIUM) 2 MG capsule Take 1 capsule (2 mg total) by mouth every 8 (eight) hours as needed for diarrhea or loose stools. 02/14/20  Yes Wieting, Richard, MD  miconazole (MICOTIN) 2 % cream Apply 1 application topically at bedtime.   Yes [provider]  nitrofurantoin, macrocrystal-monohydrate, (MACROBID) 100 MG capsule Take 100 mg by mouth 2 (two) times daily. 05/31/21  Yes [provider]  nystatin (MYCOSTATIN/NYSTOP) powder Apply 1 application topically 2 (two) times daily. 03/13/21  Yes [provider]  omeprazole (PRILOSEC) 20 MG capsule Take  20 mg by mouth daily.   Yes [provider]  sodium chloride (OCEAN) 0.65 % nasal spray Place 1 spray into the nose 2 (two) times daily as needed for congestion.   Yes [provider]  TOPROL XL 25 MG 24 hr tablet Take 12.5 mg by mouth daily. 04/11/21  Yes [provider]  trolamine salicylate (ASPERCREME/ALOE) 10 % cream Apply 1 application topically every 6 (six) hours as needed for muscle pain.   Yes [provider]  Verlee Monte (EQ HYGIENIC CLEANSING WIPES) PADS Apply 1 application topically 2 (two) times  daily. 04/13/21  Yes [provider]  GNP IRON 200 (65 Fe) MG TABS Take 1 tablet by mouth daily. Patient not taking: No sig reported 05/17/21   [provider]  hydrochlorothiazide (MICROZIDE) 12.5 MG capsule Take 12.5 mg by mouth daily. Patient not taking: No sig reported 04/03/21   [provider]  LASIX 20 MG tablet Take 20 mg by mouth daily. Patient not taking: No sig reported 05/30/21   [provider]  midodrine (PROAMATINE) 5 MG tablet Take 1 tablet (5 mg total) by mouth 3 (three) times daily with meals. Patient not taking: No sig reported 02/14/20   Loletha Grayer, MD  Multiple Vitamin (MULTIVITAMIN WITH MINERALS) TABS tablet Take 1 tablet by mouth daily. Patient not taking: No sig reported 02/14/20   Loletha Grayer, MD  QUEtiapine (SEROQUEL) 25 MG tablet Take 0.5 tablets (12.5 mg total) by mouth at bedtime as needed (insomnia). Patient not taking: No sig reported 02/14/20   Loletha Grayer, MD   Physical Exam: Blood pressure (!) 153/91, pulse (!) 124, temperature 99.7 F (37.6 C), temperature source Oral, resp. rate (!) 37, weight 110.2 kg, SpO2 94 %. Vitals:   06/01/21 1300 06/01/21 1334  BP: (!) 153/91   Pulse: (!) 124   Resp: (!) 37   Temp:  99.7 F (37.6 C)  SpO2: 94%     General: Chronically ill-appearing patient lying in bed in no obvious distress.  She is oriented to person and place Eyes: Pale conjunctiva ENT: Within normal limits Neck: Supple no JVD Cardiovascular: Tachycardic Respiratory: Clear to auscultation bilaterally Abdomen: Bowel sounds present, soft, firm palpable mass in the mid abdomen, central adiposity Skin: Warm and dry Musculoskeletal: Within normal limits Psychiatric: Normal mood and affect Neurologic: Able to move all extremities  Labs on Admission:  Basic Metabolic Panel: Recent Labs  Lab 06/01/21 0806  NA 133*  K 3.0*  CL 98  CO2 21*  GLUCOSE 185*  BUN 20  CREATININE 1.20*  CALCIUM 8.1*   Liver  Function Tests: Recent Labs  Lab 06/01/21 0806  AST 47*  ALT 25  ALKPHOS 160*  BILITOT 1.3*  PROT 7.0  ALBUMIN 3.0*   No results for input(s): LIPASE, AMYLASE in the last 168 hours. No results for input(s): AMMONIA in the last 168 hours. CBC: Recent Labs  Lab 06/01/21 0806  WBC 25.3*  NEUTROABS 21.4*  HGB 10.2*  HCT 31.8*  MCV 75.4*  PLT 378   Cardiac Enzymes: Recent Labs  Lab 06/01/21 0806  CKTOTAL 935*   BNP: Invalid input(s): POCBNP CBG: Recent Labs  Lab 06/01/21 1329  GLUCAP 186*    Radiological Exams on Admission: CT Head Wo Contrast  Result Date: 06/01/2021 CLINICAL DATA:  Trauma.  Head trauma EXAM: CT HEAD WITHOUT CONTRAST CT CERVICAL SPINE WITHOUT CONTRAST TECHNIQUE: Multidetector CT imaging of the head and cervical spine was performed following the standard protocol without intravenous  contrast. Multiplanar CT image reconstructions of the cervical spine were also generated. COMPARISON:  None. FINDINGS: CT HEAD FINDINGS Brain: No evidence of acute infarction, hemorrhage, hydrocephalus,or mass lesion/mass effect. Vascular: Negative Skull: Negative for fracture Sinuses/Orbits: No acute finding. CT CERVICAL SPINE FINDINGS Alignment: Reversal of cervical lordosis.  No traumatic malalignment Skull base and vertebrae: No evidence of acute fracture. There is generalized motion artifact. Soft tissues and spinal canal: No prevertebral fluid or swelling. No visible canal hematoma. Disc levels:  Ordinary degenerative changes for age Upper chest: No visible injury IMPRESSION: 1. Negative for intracranial hemorrhage. 2. Motion degraded cervical spine CT without fracture. Electronically Signed   By: Monte Fantasia M.D.   On: 06/01/2021 08:52   CT Cervical Spine Wo Contrast  Result Date: 06/01/2021 CLINICAL DATA:  Trauma.  Head trauma EXAM: CT HEAD WITHOUT CONTRAST CT CERVICAL SPINE WITHOUT CONTRAST TECHNIQUE: Multidetector CT imaging of the head and cervical spine was  performed following the standard protocol without intravenous contrast. Multiplanar CT image reconstructions of the cervical spine were also generated. COMPARISON:  None. FINDINGS: CT HEAD FINDINGS Brain: No evidence of acute infarction, hemorrhage, hydrocephalus,or mass lesion/mass effect. Vascular: Negative Skull: Negative for fracture Sinuses/Orbits: No acute finding. CT CERVICAL SPINE FINDINGS Alignment: Reversal of cervical lordosis.  No traumatic malalignment Skull base and vertebrae: No evidence of acute fracture. There is generalized motion artifact. Soft tissues and spinal canal: No prevertebral fluid or swelling. No visible canal hematoma. Disc levels:  Ordinary degenerative changes for age Upper chest: No visible injury IMPRESSION: 1. Negative for intracranial hemorrhage. 2. Motion degraded cervical spine CT without fracture. Electronically Signed   By: Monte Fantasia M.D.   On: 06/01/2021 08:52   DG Chest Portable 1 View  Result Date: 06/01/2021 CLINICAL DATA:  Pain following fall EXAM: PORTABLE CHEST 1 VIEW COMPARISON:  None. FINDINGS: There is elevation of the right hemidiaphragm. There is no edema or airspace opacity. Heart is mildly enlarged with pulmonary vascularity normal. No adenopathy. No bone lesions. IMPRESSION: Elevation of right hemidiaphragm. No edema or airspace opacity. Mild cardiac enlargement. Electronically Signed   By: Lowella Grip III M.D.   On: 06/01/2021 08:36   CT Renal Stone Study  Result Date: 06/01/2021 CLINICAL DATA:  White pain EXAM: CT ABDOMEN AND PELVIS WITHOUT CONTRAST TECHNIQUE: Multidetector CT imaging of the abdomen and pelvis was performed following the standard protocol without oral or IV contrast. COMPARISON:  None. FINDINGS: Lower chest: There is mild bibasilar atelectasis. No lung base edema or consolidation. Foci of coronary artery calcification noted. There is a fairly small hiatal hernia. Hepatobiliary: No focal liver lesions are appreciable on  this noncontrast enhanced study. There is cholelithiasis. Questionable mild gallbladder wall thickening. No pericholecystic fluid by CT. No biliary duct dilatation. Pancreas: No pancreatic mass or inflammatory focus. Spleen: No splenic lesions are evident. Adrenals/Urinary Tract: Mild adrenal hypertrophy on each side. No well-defined renal mass. There is an apparent cyst in the lateral mid right kidney measuring 1.5 x 1.5 cm. There is an extrarenal pelvis on the right, an anatomic variant. There is mild fullness of the right renal collecting system. No similar fullness on the left. There is no evident intrarenal calculus. There is prominence of the right ureter. There is apparent impression in the mid right ureter due to markedly enlarged uterus with suspected infection in the mid abdomen and pelvis extending toward the right. No ureterectasis on the left. No ureteral calculus on either side. Urinary bladder is midline with wall thickness  within normal limits. Stomach/Bowel: There is no appreciable bowel wall or mesenteric thickening. No evident bowel obstruction. Terminal ileum appears normal. Appendix appears normal. There is no appreciable free air or portal venous air. Vascular/Lymphatic: There is no abdominal aortic aneurysm. There are occasional foci aortic atherosclerosis. There is no appreciable adenopathy in the abdomen pelvis. Reproductive: The uterus is anteverted. The uterus is diffusely enlarged, measuring 22.1 x 12.5 x 12.2 cm. There is extensive fluid and air throughout the uterus with an air-fluid level consistent with intrauterine abscess/emphysematous pyometritis. Note that there are foci of air within the wall of this enlarged uterus. There is no adnexal region mass. Other: No ascites in the abdomen or pelvis. No abscess apart from the changes within the uterus noted. Musculoskeletal: There are no blastic or lytic bone lesions. Spinal stenosis is noted L3-4 and L4-5 due to disc protrusion and bony  hypertrophy. No blastic or lytic bone lesions are evident. No abdominal wall or intramuscular lesions are evident. IMPRESSION: 1. Markedly enlarged uterus with extensive fluid and air within this enlarged uterus consistent with intrauterine abscess. Scattered foci of air elsewhere throughout the uterus and in the uterine wall. 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 Gynecologic assessment urged in this regard. 2. Fullness of the right renal collecting system and proximal to mid ureter likely due to compression of the right ureter by a markedly enlarged and apparently infected uterus. No renal or ureteral calculi on either side. Urinary bladder wall thickness normal. 3.  Cholelithiasis.  Suspect mild gallbladder wall thickening. 4. No bowel obstruction. No abscess apart from the changes in the uterus. Appendix appears normal. 5.  Small hiatal hernia. 6. Spinal stenosis L3-4 and L4-5 due to disc protrusion and bony hypertrophy. 7. Aortic Atherosclerosis (ICD10-I70.0). Foci of coronary artery calcification also noted. 8. There is a degree of adrenal hypertrophy bilaterally, a finding of uncertain significance. Electronically Signed   By: Lowella Grip III M.D.   On: 06/01/2021 09:00    EKG: Independently reviewed.   Time spent: 65 minutes  Shaylin Blatt [facility] Pager [phone]  If 7PM-7AM, please contact night-coverage [email or web address] Password [email or web address] 06/01/2021, 2:00 PM

## 2021-06-01 NOTE — ED Notes (Addendum)
Pt called out stated brief needing changed. Purewick discarded. Brief removed. Pericare performed. Clean purewick placed and connected to suction. Clean brief placed on pt. Pt repositioned in bed by this RN and Sheran Luz. Covered with warm blankets. Denies further needs at this time.

## 2021-06-01 NOTE — ED Notes (Signed)
Pt repositioned in bed by this RN and Patent examiner, clean/dry chucks/brief placed under patient at this time. Pt's granddaughter remains at bedside at this time.

## 2021-06-01 NOTE — H&P (Addendum)
GYNECOLOGY CONSULT NOTE  GYN Consultation  Attending Provider: Vanessa Coward, MD   Alexis Mclaughlin [id number] 06/01/2021 11:26 AM    Reason for Consultation:   Alexis Mclaughlin is a 75 y.o.  postmenopausal female seen at the request of Vanessa Houston, MD, for evaluation of suspected uterine abscess.    History of Present Ilness:   The patient has dementia. So, the history was obtained from her daughter and granddaughter.  According to her daughter the patient started treatement for UTI yesterday. Her daughter called her at 45 AM today and there was no answer.  So, she sent the granddaughter over to her house to check on her. She was found on the floor. So, she was brought to the ER.  The patient only states that she has back and shoulder pain. Her daughter reports seeing blood on her underwear recently.  Her daughter states that the patient has a history of renal failure and tachycardia, as well as diabetes, hypothyroidism and hypertension.     Past Medical History:  Diagnosis Date   Diabetes mellitus without complication ([place])    Hypertension    Hypothyroidism    Past Surgical History:  Procedure Laterality Date   BREAST BIOPSY Right 09/02/2018   affirm stereo/path pending   Allergies  Allergen Reactions   Ace Inhibitors    Prior to Admission medications   Medication Sig Start Date End Date Taking? Authorizing Provider  ACETAMINOPHEN 8 HOUR PO Take 650 mg by mouth daily.   Yes [provider]  amLODipine (NORVASC) 10 MG tablet Take 1 tablet by mouth daily. 04/10/21  Yes [provider]  aspirin EC 81 MG tablet Take 81 mg by mouth daily.   Yes [provider]  camphor-menthol Timoteo Ace) lotion Apply 1 application topically 3 (three) times daily as needed for itching.   Yes [provider]  cholecalciferol (VITAMIN D3) 25 MCG (1000 UNIT) tablet Take 1,000 Units by mouth daily.   Yes [provider]  diclofenac Sodium (VOLTAREN) 1 % GEL Apply to  right shoulder and neck three times a day 02/14/20  Yes Wieting, Richard, MD  docusate sodium (COLACE) 100 MG capsule Take 100 mg by mouth daily.   Yes [provider]  DULoxetine (CYMBALTA) 60 MG capsule Take 60 mg by mouth daily.   Yes [provider]  fluticasone (FLONASE) 50 MCG/ACT nasal spray Place 2 sprays into both nostrils daily.   Yes [provider]  levothyroxine (SYNTHROID) 50 MCG tablet Take 50 mcg by mouth daily before breakfast.   Yes [provider]  lidocaine (LIDODERM) 5 % Place 1 patch onto the skin daily. Remove & Discard patch within 12 hours or as directed by MD   Yes [provider]  LIPITOR 80 MG tablet Take 1 tablet by mouth daily. 04/11/21  Yes [provider]  loperamide (IMODIUM) 2 MG capsule Take 1 capsule (2 mg total) by mouth every 8 (eight) hours as needed for diarrhea or loose stools. 02/14/20  Yes Wieting, Richard, MD  miconazole (MICOTIN) 2 % cream Apply 1 application topically at bedtime.   Yes [provider]  nitrofurantoin, macrocrystal-monohydrate, (MACROBID) 100 MG capsule Take 100 mg by mouth 2 (two) times daily. 05/31/21  Yes [provider]  nystatin (MYCOSTATIN/NYSTOP) powder Apply 1 application topically 2 (two) times daily. 03/13/21  Yes [provider]  omeprazole (PRILOSEC) 20 MG capsule Take 20 mg by mouth daily.   Yes [provider]  sodium chloride (OCEAN)  0.65 % nasal spray Place 1 spray into the nose 2 (two) times daily as needed for congestion.   Yes [provider]  TOPROL XL 25 MG 24 hr tablet Take 12.5 mg by mouth daily. 04/11/21  Yes [provider]  trolamine salicylate (ASPERCREME/ALOE) 10 % cream Apply 1 application topically every 6 (six) hours as needed for muscle pain.   Yes [provider]  Verlee Monte (EQ HYGIENIC CLEANSING WIPES) PADS Apply 1 application topically 2 (two) times daily. 04/13/21  Yes [provider]     Social History:  She  reports that she has quit smoking. She has never used smokeless tobacco. She reports previous alcohol use. She reports that she does not use drugs.  Family History:  family history includes CAD in her father and mother.   Review of Systems  Constitutional:  Positive for chills. Negative for diaphoresis, fever, malaise/fatigue and weight loss.  HENT: Negative.    Eyes: Negative.   Respiratory: Negative.    Cardiovascular: Negative.   Gastrointestinal: Negative.   Genitourinary: Negative.   Musculoskeletal:  Positive for back pain, falls (recent per daughter) and joint pain (shoulder). Negative for myalgias.  Skin: Negative.   Neurological: Negative.   Psychiatric/Behavioral: Negative.      Objective    BP (!) 153/75   Pulse (!) 116   Temp 99.5 F (37.5 C) (Oral)   Resp (!) 30   Wt 110.2 kg   SpO2 96%   BMI 43.05 kg/m  Physical Exam Constitutional:      General: She is not in acute distress.    Appearance: Normal appearance. She is well-developed.  HENT:     Head: Normocephalic and atraumatic.  Eyes:     General: No scleral icterus.    Conjunctiva/sclera: Conjunctivae normal.  Cardiovascular:     Rate and Rhythm: Normal rate and regular rhythm.     Heart sounds: No murmur heard.   No friction rub. No gallop.  Pulmonary:     Effort: Pulmonary effort is normal. No respiratory distress.     Breath sounds: Normal breath sounds. No wheezing or rales.  Abdominal:     General: Bowel sounds are normal. There is no distension.     Palpations: Abdomen is soft. There is mass (slightly above umbilicus).     Tenderness: There is no abdominal tenderness. There is no guarding or rebound.  Musculoskeletal:        General: Normal range of motion.     Cervical back: Normal range of motion and neck supple.  Neurological:     General: No focal deficit present.     Mental Status: She is alert and oriented to person, place, and time.     Cranial Nerves: No  cranial nerve deficit.  Skin:    General: Skin is warm and dry.     Findings: No erythema.  Psychiatric:        Behavior: Behavior is cooperative.        Cognition and Memory: Cognition is impaired. Memory is impaired. She exhibits impaired recent memory and impaired remote memory.     Laboratory Results:   Lab Results  Component Value Date   WBC 25.3 (H) 06/01/2021   RBC 4.22 06/01/2021   HGB 10.2 (L) 06/01/2021   HCT 31.8 (L) 06/01/2021   PLT 378 06/01/2021   NA 133 (L) 06/01/2021   K 3.0 (L) 06/01/2021   CREATININE 1.20 (H) 06/01/2021   No results found for: PREGTESTUR, PREGSERUM, HCG,  HCGQUANT  Imaging Results:  Imaging Results CT Head Wo Contrast  Result Date: 06/01/2021 CLINICAL DATA:  Trauma.  Head trauma EXAM: CT HEAD WITHOUT CONTRAST CT CERVICAL SPINE WITHOUT CONTRAST TECHNIQUE: Multidetector CT imaging of the head and cervical spine was performed following the standard protocol without intravenous contrast. Multiplanar CT image reconstructions of the cervical spine were also generated. COMPARISON:  None. FINDINGS: CT HEAD FINDINGS Brain: No evidence of acute infarction, hemorrhage, hydrocephalus,or mass lesion/mass effect. Vascular: Negative Skull: Negative for fracture Sinuses/Orbits: No acute finding. CT CERVICAL SPINE FINDINGS Alignment: Reversal of cervical lordosis.  No traumatic malalignment Skull base and vertebrae: No evidence of acute fracture. There is generalized motion artifact. Soft tissues and spinal canal: No prevertebral fluid or swelling. No visible canal hematoma. Disc levels:  Ordinary degenerative changes for age Upper chest: No visible injury IMPRESSION: 1. Negative for intracranial hemorrhage. 2. Motion degraded cervical spine CT without fracture. Electronically Signed   By: Monte Fantasia M.D.   On: 06/01/2021 08:52   CT Cervical Spine Wo Contrast  Result Date: 06/01/2021 CLINICAL DATA:  Trauma.  Head trauma EXAM: CT HEAD WITHOUT CONTRAST CT CERVICAL  SPINE WITHOUT CONTRAST TECHNIQUE: Multidetector CT imaging of the head and cervical spine was performed following the standard protocol without intravenous contrast. Multiplanar CT image reconstructions of the cervical spine were also generated. COMPARISON:  None. FINDINGS: CT HEAD FINDINGS Brain: No evidence of acute infarction, hemorrhage, hydrocephalus,or mass lesion/mass effect. Vascular: Negative Skull: Negative for fracture Sinuses/Orbits: No acute finding. CT CERVICAL SPINE FINDINGS Alignment: Reversal of cervical lordosis.  No traumatic malalignment Skull base and vertebrae: No evidence of acute fracture. There is generalized motion artifact. Soft tissues and spinal canal: No prevertebral fluid or swelling. No visible canal hematoma. Disc levels:  Ordinary degenerative changes for age Upper chest: No visible injury IMPRESSION: 1. Negative for intracranial hemorrhage. 2. Motion degraded cervical spine CT without fracture. Electronically Signed   By: Monte Fantasia M.D.   On: 06/01/2021 08:52   DG Chest Portable 1 View  Result Date: 06/01/2021 CLINICAL DATA:  Pain following fall EXAM: PORTABLE CHEST 1 VIEW COMPARISON:  None. FINDINGS: There is elevation of the right hemidiaphragm. There is no edema or airspace opacity. Heart is mildly enlarged with pulmonary vascularity normal. No adenopathy. No bone lesions. IMPRESSION: Elevation of right hemidiaphragm. No edema or airspace opacity. Mild cardiac enlargement. Electronically Signed   By: Lowella Grip III M.D.   On: 06/01/2021 08:36   CT Renal Stone Study  Result Date: 06/01/2021 CLINICAL DATA:  White pain EXAM: CT ABDOMEN AND PELVIS WITHOUT CONTRAST TECHNIQUE: Multidetector CT imaging of the abdomen and pelvis was performed following the standard protocol without oral or IV contrast. COMPARISON:  None. FINDINGS: Lower chest: There is mild bibasilar atelectasis. No lung base edema or consolidation. Foci of coronary artery calcification noted. There  is a fairly small hiatal hernia. Hepatobiliary: No focal liver lesions are appreciable on this noncontrast enhanced study. There is cholelithiasis. Questionable mild gallbladder wall thickening. No pericholecystic fluid by CT. No biliary duct dilatation. Pancreas: No pancreatic mass or inflammatory focus. Spleen: No splenic lesions are evident. Adrenals/Urinary Tract: Mild adrenal hypertrophy on each side. No well-defined renal mass. There is an apparent cyst in the lateral mid right kidney measuring 1.5 x 1.5 cm. There is an extrarenal pelvis on the right, an anatomic variant. There is mild fullness of the right renal collecting system. No similar fullness on the left. There is no evident intrarenal calculus. There is  prominence of the right ureter. There is apparent impression in the mid right ureter due to markedly enlarged uterus with suspected infection in the mid abdomen and pelvis extending toward the right. No ureterectasis on the left. No ureteral calculus on either side. Urinary bladder is midline with wall thickness within normal limits. Stomach/Bowel: There is no appreciable bowel wall or mesenteric thickening. No evident bowel obstruction. Terminal ileum appears normal. Appendix appears normal. There is no appreciable free air or portal venous air. Vascular/Lymphatic: There is no abdominal aortic aneurysm. There are occasional foci aortic atherosclerosis. There is no appreciable adenopathy in the abdomen pelvis. Reproductive: The uterus is anteverted. The uterus is diffusely enlarged, measuring 22.1 x 12.5 x 12.2 cm. There is extensive fluid and air throughout the uterus with an air-fluid level consistent with intrauterine abscess/emphysematous pyometritis. Note that there are foci of air within the wall of this enlarged uterus. There is no adnexal region mass. Other: No ascites in the abdomen or pelvis. No abscess apart from the changes within the uterus noted. Musculoskeletal: There are no blastic or  lytic bone lesions. Spinal stenosis is noted L3-4 and L4-5 due to disc protrusion and bony hypertrophy. No blastic or lytic bone lesions are evident. No abdominal wall or intramuscular lesions are evident. IMPRESSION: 1. Markedly enlarged uterus with extensive fluid and air within this enlarged uterus consistent with intrauterine abscess. Scattered foci of air elsewhere throughout the uterus and in the uterine wall. 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 Gynecologic assessment urged in this regard. 2. Fullness of the right renal collecting system and proximal to mid ureter likely due to compression of the right ureter by a markedly enlarged and apparently infected uterus. No renal or ureteral calculi on either side. Urinary bladder wall thickness normal. 3.  Cholelithiasis.  Suspect mild gallbladder wall thickening. 4. No bowel obstruction. No abscess apart from the changes in the uterus. Appendix appears normal. 5.  Small hiatal hernia. 6. Spinal stenosis L3-4 and L4-5 due to disc protrusion and bony hypertrophy. 7. Aortic Atherosclerosis (ICD10-I70.0). Foci of coronary artery calcification also noted. 8. There is a degree of adrenal hypertrophy bilaterally, a finding of uncertain significance. Electronically Signed   By: Lowella Grip III M.D.   On: 06/01/2021 09:00      Assessment & Plan   Raseel Jans is a 75 y.o.  postmenopausal female with an intrauterine collection, likely abscess.  Differential could include neoplasia, abscess.    Plan:  1.  Intrauterine collection: strong suspicion for abscess. Etiology unclear.  Cancer of the uterus must be in the differential. So, I spoke with Santiago Glad, MD, from [place]. She recommended, due to the patients comorbid conditions and age, that she undergo IV antibiotics for 24 hours. If no improvement, transfer to [facility] for possible surgical intervention. If she does improve, outpatient  management may be possible.  - will continue current antibiotics. - Will consul hospitalist for co-management 2. Hypertension: will treat per hospitlist 3. Tachycardia: gentle fluid resuscitation. But, will defer to hospitalist.   4. Hypothyroidism: continue current medication.   5. Dementia: communicate with her POA for decision making. 6. Type 2 Diabetes: treatment per hospitalist.   7. Chronic kidney disease: per hospitalist 8. Hyponatremia, hypokalemia, hypocalcemia, elevated AST: per hospitalist.  9. Dispo: pending treatment course. Possibly to previous residence or possibly to [facility], if does not improve.   Prentice Docker, MD 06/01/2021 11:26 AM

## 2021-06-01 NOTE — ED Notes (Signed)
Pt transported to CT at this time.

## 2021-06-01 NOTE — ED Triage Notes (Signed)
Pt in from home via [facility] after fall at home. Pt states she has ongoing UTI, started Nitrofurantin yesterday. Temp 99, HR 126, BP 120/60's en route. A&ox4, denies any LOC after fall incident. C/o some neck/back pain, but no worse than usual arthritis pain.

## 2021-06-02 ENCOUNTER — Inpatient Hospital Stay: Payer: Medicare (Managed Care)

## 2021-06-02 ENCOUNTER — Encounter: Payer: Self-pay | Admitting: Obstetrics and Gynecology

## 2021-06-02 DIAGNOSIS — E876 Hypokalemia: Secondary | ICD-10-CM

## 2021-06-02 DIAGNOSIS — N1831 Chronic kidney disease, stage 3a: Secondary | ICD-10-CM

## 2021-06-02 DIAGNOSIS — A419 Sepsis, unspecified organism: Secondary | ICD-10-CM

## 2021-06-02 LAB — GLUCOSE, CAPILLARY
Glucose-Capillary: 145 mg/dL — ABNORMAL HIGH (ref 70–99)
Glucose-Capillary: 149 mg/dL — ABNORMAL HIGH (ref 70–99)
Glucose-Capillary: 165 mg/dL — ABNORMAL HIGH (ref 70–99)
Glucose-Capillary: 173 mg/dL — ABNORMAL HIGH (ref 70–99)
Glucose-Capillary: 189 mg/dL — ABNORMAL HIGH (ref 70–99)
Glucose-Capillary: 200 mg/dL — ABNORMAL HIGH (ref 70–99)

## 2021-06-02 LAB — CBC
HCT: 34.9 % — ABNORMAL LOW (ref 36.0–46.0)
Hemoglobin: 11.2 g/dL — ABNORMAL LOW (ref 12.0–15.0)
MCH: 23.7 pg — ABNORMAL LOW (ref 26.0–34.0)
MCHC: 32.1 g/dL (ref 30.0–36.0)
MCV: 73.9 fL — ABNORMAL LOW (ref 80.0–100.0)
Platelets: 384 10*3/uL (ref 150–400)
RBC: 4.72 MIL/uL (ref 3.87–5.11)
RDW: 14.7 % (ref 11.5–15.5)
WBC: 31.6 10*3/uL — ABNORMAL HIGH (ref 4.0–10.5)
nRBC: 0 % (ref 0.0–0.2)

## 2021-06-02 LAB — COMPREHENSIVE METABOLIC PANEL
ALT: 32 U/L (ref 0–44)
AST: 85 U/L — ABNORMAL HIGH (ref 15–41)
Albumin: 2.6 g/dL — ABNORMAL LOW (ref 3.5–5.0)
Alkaline Phosphatase: 179 U/L — ABNORMAL HIGH (ref 38–126)
Anion gap: 10 (ref 5–15)
BUN: 12 mg/dL (ref 8–23)
CO2: 23 mmol/L (ref 22–32)
Calcium: 7.9 mg/dL — ABNORMAL LOW (ref 8.9–10.3)
Chloride: 103 mmol/L (ref 98–111)
Creatinine, Ser: 0.8 mg/dL (ref 0.44–1.00)
GFR, Estimated: >60 mL/min (ref >60)
Glucose, Bld: 148 mg/dL — ABNORMAL HIGH (ref 70–99)
Potassium: 2.8 mmol/L — ABNORMAL LOW (ref 3.5–5.1)
Sodium: 136 mmol/L (ref 135–145)
Total Bilirubin: 1.6 mg/dL — ABNORMAL HIGH (ref 0.3–1.2)
Total Protein: 6.8 g/dL (ref 6.5–8.1)

## 2021-06-02 LAB — PROCALCITONIN: Procalcitonin: 58.78 ng/mL

## 2021-06-02 MED ORDER — POTASSIUM CHLORIDE CRYS ER 20 MEQ PO TBCR
20.0000 meq | EXTENDED_RELEASE_TABLET | Freq: Two times a day (BID) | ORAL | Status: DC
  Administered 2021-06-02 – 2021-06-03 (×3): 20 meq via ORAL
  Filled 2021-06-02 (×3): qty 1

## 2021-06-02 MED ORDER — DULOXETINE HCL 30 MG PO CPEP
60.0000 mg | ORAL_CAPSULE | Freq: Every day | ORAL | Status: DC
  Administered 2021-06-02 – 2021-06-03 (×2): 60 mg via ORAL
  Filled 2021-06-02 (×2): qty 2

## 2021-06-02 MED ORDER — LACTATED RINGERS IV SOLN: INTRAVENOUS | Status: DC

## 2021-06-02 MED ORDER — OXYCODONE HCL 5 MG PO TABS
5.0000 mg | ORAL_TABLET | ORAL | Status: DC | PRN
  Administered 2021-06-02 – 2021-06-03 (×3): 5 mg via ORAL
  Filled 2021-06-02 (×3): qty 1

## 2021-06-02 MED ORDER — MAGNESIUM SULFATE 2 GM/50ML IV SOLN
2.0000 g | Freq: Once | INTRAVENOUS | Status: AC
  Administered 2021-06-02: 09:00:00 2 g via INTRAVENOUS
  Filled 2021-06-02: qty 50

## 2021-06-02 MED ORDER — MORPHINE SULFATE (PF) 2 MG/ML IV SOLN
2.0000 mg | INTRAVENOUS | Status: DC | PRN
  Administered 2021-06-02: 2 mg via INTRAVENOUS
  Filled 2021-06-02: qty 1

## 2021-06-02 MED ORDER — ATORVASTATIN CALCIUM 20 MG PO TABS
80.0000 mg | ORAL_TABLET | Freq: Every day | ORAL | Status: DC
  Administered 2021-06-02 – 2021-06-03 (×2): 80 mg via ORAL
  Filled 2021-06-02 (×2): qty 4

## 2021-06-02 NOTE — Progress Notes (Signed)
During nursing report we found patient sitting in a pool of blood. She had passed a large chunk of tissue and it appeared to be from her vagina. Her BP is stable at 138/80, hr continues to be tachycardic at 124, temp and O2 WNL. Nurse giving report stated that she did appear to have new onset pallor. Patient stated that she is not in pain, and that she feels fine. The mass was saved. MD notified.

## 2021-06-02 NOTE — Progress Notes (Signed)
[facility] PROGRESS NOTE    Alexis Mclaughlin  XQJ:[id number] DOB: Jan 28, 1946 DOA: 06/01/2021 PCP: Patient, No Pcp Per (Inactive)      Brief Narrative:  Alexis Mclaughlin is a 75 y.o. F with obesity, DM, HTN, and hypothyroidism who presented with pelvic discomfort and vaginal discharge.  Evidently the patient developed some symptoms in the last several days, that she thought her UTI (hard to understand but I gather that this was vaginal discharge and pelvic discomfort and blood in underwear).  She had only completed 1 dose of macrobid when she fell and was too weak to get up and was brought to the ER.    In the ER, she had fever, tachycardia, and a CT of the abdomen pelvis showed emphysematous pyelonephritis.  The case was discussed with gynecology at [facility] who recommended treatment with IV antibiotics and possible transfer for follow-up.       Assessment & Plan:  Emphysematous pyometritis Meets SIRS criteria but no end organ damage, Sepsis ruled out.  CT from yesterday showed a 12 x 22 cm enlargement of the uterus with air-fluid levels and foci of air within the walls, no adnexal mass.  Patient was started on IV antibiotics and IV fluids, today passed a large solid mass from her uterus, likely organized infectious material.  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, non-insulin-dependent Glucose well controlled -Continue sliding scale correction insulin - Continue aspirin and atorvastatin  Hypertension -Hold home amlodipine until hemodynamics clear  Hypothyroidism -Continue levothyroxine  Mood disorder -Continue duloxetine   Hyponatremia Asymptomatic  Hypomagnesemia, hypokalemia -Supplement potassium and magnesium  CKD stage  IIIa  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   Difficult to place patient No   Patient admitted with pyometritis.  If able to dilate and drainuterus and stabilize on antibioitics, could follow up with GYN Oncology as outpaitent, otherwise will need transfer to [facility] or [facility] for [place] consultation    Level of care: Med-Surg       MDM: The below labs and imaging reports were reviewed and summarized above.  Medication management as above.    DVT prophylaxis: SCDs Start: 06/01/21 1208  Code Status: DNR Family Communication: Daughter and granddaguhter at the bedside               Subjective: She has had some spotting since the discharge of the tissue from her cervix this morning.  She has no confusion, no chest discomfort, no abdominal pain.  No further fever.  Objective: Vitals:   06/01/21 2338 06/02/21 0019 06/02/21 0349 06/02/21 0746  BP: 139/73 (!) 155/91 (!) 163/76 138/80  Pulse: (!) 108 (!) 118 (!) 115 (!) 123  Resp:  20 20 18   Temp:  98.2 F (36.8 C) 98 F (36.7 C) 98.1 F (36.7 C)  TempSrc:  Oral    SpO2: 97% 97% 95% 98%  Weight:  Height:        Intake/Output Summary (Last 24 hours) at 06/02/2021 1338 Last data filed at 06/02/2021 1038 Gross per 24 hour  Intake 1560 ml  Output 150 ml  Net 1410 ml   Filed Weights   06/01/21 0804 06/01/21 1656  Weight: 110.2 kg 110 kg    Examination: General appearance: Obese elderly adult female, alert and in no acute distress.   HEENT: Anicteric, conjunctiva pink, lids and lashes normal. No nasal deformity, discharge, epistaxis.  Lips moist, edentulous, oropharynx tacky dry, no oral lesions.   Skin: Warm and dry.  no  jaundice.  No suspicious rashes or lesions. Cardiac: Tachycardic, regular, nl S1-S2, no murmurs appreciated.  Capillary refill is brisk.  JVP normal  No LE edema.  Radial pulses 2+ and symmetric. Respiratory: Normal respiratory rate and rhythm.  CTAB without rales or wheezes. Abdomen: Abdomen soft.  No TTP or guarding at all. No ascites, distension, hepatosplenomegaly.   MSK: No deformities or effusions. Neuro: Awake and alert.  EOMI, moves all extremities. Speech fluent.    Psych: Sensorium intact and responding to questions, attention normal. Affect normal.  Judgment and insight appear normal.    Data Reviewed: I have personally reviewed following labs and imaging studies:  CBC: Recent Labs  Lab 06/01/21 0806 06/02/21 0541  WBC 25.3* 31.6*  NEUTROABS 21.4*  --   HGB 10.2* 11.2*  HCT 31.8* 34.9*  MCV 75.4* 73.9*  PLT 378 035   Basic Metabolic Panel: Recent Labs  Lab 06/01/21 0806 06/01/21 1001  NA 133*  --   K 3.0*  --   CL 98  --   CO2 21*  --   GLUCOSE 185*  --   BUN 20  --   CREATININE 1.20*  --   CALCIUM 8.1*  --   MG  --  1.6*   GFR: Estimated Creatinine Clearance: 47.5 mL/min (A) (by C-G formula based on SCr of 1.2 mg/dL (H)). Liver Function Tests: Recent Labs  Lab 06/01/21 0806  AST 47*  ALT 25  ALKPHOS 160*  BILITOT 1.3*  PROT 7.0  ALBUMIN 3.0*   No results for input(s): LIPASE, AMYLASE in the last 168 hours. No results for input(s): AMMONIA in the last 168 hours. Coagulation Profile: No results for input(s): INR, PROTIME in the last 168 hours. Cardiac Enzymes: Recent Labs  Lab 06/01/21 0806  CKTOTAL 935*   BNP (last 3 results) No results for input(s): PROBNP in the last 8760 hours. HbA1C: Recent Labs    06/01/21 0806  HGBA1C 7.5*   CBG: Recent Labs  Lab 06/01/21 1654 06/01/21 2055 06/01/21 2359 06/02/21 0353 06/02/21 0817  GLUCAP 187* 206* 152* 149* 145*   Lipid Profile: No results for input(s): CHOL, HDL, LDLCALC, TRIG,  CHOLHDL, LDLDIRECT in the last 72 hours. Thyroid Function Tests: No results for input(s): TSH, T4TOTAL, FREET4, T3FREE, THYROIDAB in the last 72 hours. Anemia Panel: No results for input(s): VITAMINB12, FOLATE, FERRITIN, TIBC, IRON, RETICCTPCT in the last 72 hours. Urine analysis:    Component Value Date/Time   COLORURINE YELLOW (A) 06/01/2021 0803   APPEARANCEUR HAZY (A) 06/01/2021 0803   LABSPEC 1.011 06/01/2021 0803   PHURINE 5.0 06/01/2021 0803   GLUCOSEU NEGATIVE 06/01/2021 0803   HGBUR LARGE (A) 06/01/2021 0803   BILIRUBINUR NEGATIVE 06/01/2021 0803   KETONESUR NEGATIVE 06/01/2021 0803   PROTEINUR 30 (A) 06/01/2021 0803   NITRITE NEGATIVE 06/01/2021 0803   LEUKOCYTESUR MODERATE (A) 06/01/2021 0803   Sepsis Labs: @LABRCNTIP (procalcitonin:4,lacticacidven:4)  )  Recent Results (from the past 240 hour(s))  Blood culture (routine x 2)     Status: None (Preliminary result)   Collection Time: 06/01/21  8:06 AM   Specimen: BLOOD  Result Value Ref Range Status   Specimen Description   Final    BLOOD RIGHT ANTECUBITAL Performed at [facility], [address], [place], [place] [address]    Special Requests   Final    BOTTLES DRAWN AEROBIC AND ANAEROBIC Blood Culture adequate volume Performed at [facility], [address] [place], [place] [address]    Culture  Setup Time   Final    GRAM POSITIVE RODS ANAEROBIC BOTTLE ONLY CRITICAL RESULT CALLED TO, READ BACK BY AND VERIFIED WITH: KISHAN PATEL @1610  ON 06.10.22.[place]    Culture   Final    NO GROWTH < 24 HOURS Performed at [facility], [address] [place], [place] [address]    Report Status PENDING  Incomplete  Blood culture (routine x 2)     Status: None (Preliminary result)   Collection Time: 06/01/21  8:06 AM   Specimen: BLOOD RIGHT HAND  Result Value Ref Range Status   Specimen Description   Final    BLOOD RIGHT HAND Performed at [facility], [address],  [place], [place] [address]    Special Requests   Final    BOTTLES DRAWN AEROBIC AND ANAEROBIC Blood Culture results may not be optimal due to an inadequate volume of blood received in culture bottles Performed at [facility], [address], [place], [place] [address]    Culture  Setup Time ANAEROBIC BOTTLE ONLY GRAM POSITIVE RODS   Final   Culture GRAM POSITIVE RODS  Final   Report Status PENDING  Incomplete  Resp Panel by RT-PCR (Flu A&B, Covid) Nasopharyngeal Swab     Status: None   Collection Time: 06/01/21  8:06 AM   Specimen: Nasopharyngeal Swab; Nasopharyngeal(NP) swabs in vial transport medium  Result Value Ref Range Status   SARS Coronavirus 2 by RT PCR NEGATIVE NEGATIVE Final    Comment: (NOTE) SARS-CoV-2 target nucleic acids are NOT DETECTED.  The SARS-CoV-2 RNA is generally detectable in upper respiratory specimens during the acute phase of infection. The lowest concentration of SARS-CoV-2 viral copies this assay can detect is 138 copies/mL. A negative result does not preclude SARS-Cov-2 infection and should not be used as the sole basis for treatment or other patient management decisions. A negative result may occur with  improper specimen collection/handling, submission of specimen other than nasopharyngeal swab, presence of viral mutation(s) within the areas targeted by this assay, and inadequate number of viral copies(<138 copies/mL). A negative result must be combined with clinical observations, patient history, and epidemiological information. The expected result is Negative.  Fact Sheet for Patients:  EntrepreneurPulse.com.au  Fact Sheet for Healthcare Providers:  IncredibleEmployment.be  This test is no t yet approved or cleared by the [place] FDA and  has been authorized for detection and/or diagnosis of SARS-CoV-2 by FDA under an Emergency Use Authorization (EUA). This EUA will remain  in effect (meaning this  test can be used) for the duration of the COVID-19 declaration under Section 564(b)(1) of the Act, 21 U.S.C.section 360bbb-3(b)(1), unless the authorization is terminated  or revoked sooner.       Influenza A by PCR NEGATIVE NEGATIVE Final   Influenza B by PCR NEGATIVE NEGATIVE Final    Comment: (NOTE) The Xpert Xpress SARS-CoV-2/FLU/RSV plus assay is intended as an  aid in the diagnosis of influenza from Nasopharyngeal swab specimens and should not be used as a sole basis for treatment. Nasal washings and aspirates are unacceptable for Xpert Xpress SARS-CoV-2/FLU/RSV testing.  Fact Sheet for Patients: EntrepreneurPulse.com.au  Fact Sheet for Healthcare Providers: IncredibleEmployment.be  This test is not yet approved or cleared by the [place] FDA and has been authorized for detection and/or diagnosis of SARS-CoV-2 by FDA under an Emergency Use Authorization (EUA). This EUA will remain in effect (meaning this test can be used) for the duration of the COVID-19 declaration under Section 564(b)(1) of the Act, 21 U.S.C. section 360bbb-3(b)(1), unless the authorization is terminated or revoked.  Performed at [facility], [address] [place], [place] [address]          Radiology Studies: DG Chest 1 View  Result Date: 06/01/2021 CLINICAL DATA:  Increasing hypoxia.  Ex-smoker. EXAM: CHEST  1 VIEW COMPARISON:  06/01/2021 FINDINGS: Poor inspiration, decreased. Stable mildly enlarged cardiac silhouette and that stable mildly enlarged cardiac silhouette. Mild increase in prominence of the pulmonary vasculature and interstitial markings due to the poor inspiration. No pleural fluid. Thoracic spine degenerative changes. Bilateral shoulder degenerative changes. IMPRESSION: No acute abnormality. Stable mild cardiomegaly and mild interstitial lung disease. Electronically Signed   By: Claudie Revering M.D.   On: 06/01/2021 14:55   CT Head  Wo Contrast  Result Date: 06/01/2021 CLINICAL DATA:  Trauma.  Head trauma EXAM: CT HEAD WITHOUT CONTRAST CT CERVICAL SPINE WITHOUT CONTRAST TECHNIQUE: Multidetector CT imaging of the head and cervical spine was performed following the standard protocol without intravenous contrast. Multiplanar CT image reconstructions of the cervical spine were also generated. COMPARISON:  None. FINDINGS: CT HEAD FINDINGS Brain: No evidence of acute infarction, hemorrhage, hydrocephalus,or mass lesion/mass effect. Vascular: Negative Skull: Negative for fracture Sinuses/Orbits: No acute finding. CT CERVICAL SPINE FINDINGS Alignment: Reversal of cervical lordosis.  No traumatic malalignment Skull base and vertebrae: No evidence of acute fracture. There is generalized motion artifact. Soft tissues and spinal canal: No prevertebral fluid or swelling. No visible canal hematoma. Disc levels:  Ordinary degenerative changes for age Upper chest: No visible injury IMPRESSION: 1. Negative for intracranial hemorrhage. 2. Motion degraded cervical spine CT without fracture. Electronically Signed   By: Monte Fantasia M.D.   On: 06/01/2021 08:52   CT Cervical Spine Wo Contrast  Result Date: 06/01/2021 CLINICAL DATA:  Trauma.  Head trauma EXAM: CT HEAD WITHOUT CONTRAST CT CERVICAL SPINE WITHOUT CONTRAST TECHNIQUE: Multidetector CT imaging of the head and cervical spine was performed following the standard protocol without intravenous contrast. Multiplanar CT image reconstructions of the cervical spine were also generated. COMPARISON:  None. FINDINGS: CT HEAD FINDINGS Brain: No evidence of acute infarction, hemorrhage, hydrocephalus,or mass lesion/mass effect. Vascular: Negative Skull: Negative for fracture Sinuses/Orbits: No acute finding. CT CERVICAL SPINE FINDINGS Alignment: Reversal of cervical lordosis.  No traumatic malalignment Skull base and vertebrae: No evidence of acute fracture. There is generalized motion artifact. Soft tissues  and spinal canal: No prevertebral fluid or swelling. No visible canal hematoma. Disc levels:  Ordinary degenerative changes for age Upper chest: No visible injury IMPRESSION: 1. Negative for intracranial hemorrhage. 2. Motion degraded cervical spine CT without fracture. Electronically Signed   By: Monte Fantasia M.D.   On: 06/01/2021 08:52   US PELVIS (TRANSABDOMINAL ONLY)  Addendum Date: 06/02/2021   ADDENDUM REPORT: 06/02/2021 12:21 ADDENDUM: These results were called by telephone at the time of interpretation on 06/02/2021 at 12:21 pm to  provider Teresa Coombs , who verbally acknowledged these results. Electronically Signed   By: Marin Olp M.D.   On: 06/02/2021 12:21   Result Date: 06/02/2021 CLINICAL DATA:  Possible uterine mass. EXAM: TRANSABDOMINAL ULTRASOUND OF PELVIS TECHNIQUE: Transabdominal ultrasound examination of the pelvis was performed including evaluation of the uterus, ovaries, adnexal regions, and pelvic cul-de-sac. COMPARISON:  CT 06/01/2021 FINDINGS: Uterus Measurements: 17.9 x 8.7 x 12.0 cm = volume: 975 mL. Examination demonstrates similar findings to that seen on recent CT scan with moderate amount of air throughout the central visualized portion of the uterus obscuring the endometrium and adjacent parametrial tissue/myometrium. Findings are compatible with gas-forming infection. Endometrium Not visualized as obscured due to moderate amounts of air as described. Right ovary Not visualized. Left ovary Not visualized. Other findings:  No abnormal free fluid. IMPRESSION: Moderately enlarged uterus containing moderate amount of air obscuring the endometrium and adjacent myometrium compatible gas-forming infection as shown on recent CT. Ovaries not visualized. Electronically Signed: By: Marin Olp M.D. On: 06/02/2021 12:07   DG Chest Portable 1 View  Result Date: 06/01/2021 CLINICAL DATA:  Pain following fall EXAM: PORTABLE CHEST 1 VIEW COMPARISON:  None. FINDINGS: There is  elevation of the right hemidiaphragm. There is no edema or airspace opacity. Heart is mildly enlarged with pulmonary vascularity normal. No adenopathy. No bone lesions. IMPRESSION: Elevation of right hemidiaphragm. No edema or airspace opacity. Mild cardiac enlargement. Electronically Signed   By: Lowella Grip III M.D.   On: 06/01/2021 08:36   CT Renal Stone Study  Result Date: 06/01/2021 CLINICAL DATA:  White pain EXAM: CT ABDOMEN AND PELVIS WITHOUT CONTRAST TECHNIQUE: Multidetector CT imaging of the abdomen and pelvis was performed following the standard protocol without oral or IV contrast. COMPARISON:  None. FINDINGS: Lower chest: There is mild bibasilar atelectasis. No lung base edema or consolidation. Foci of coronary artery calcification noted. There is a fairly small hiatal hernia. Hepatobiliary: No focal liver lesions are appreciable on this noncontrast enhanced study. There is cholelithiasis. Questionable mild gallbladder wall thickening. No pericholecystic fluid by CT. No biliary duct dilatation. Pancreas: No pancreatic mass or inflammatory focus. Spleen: No splenic lesions are evident. Adrenals/Urinary Tract: Mild adrenal hypertrophy on each side. No well-defined renal mass. There is an apparent cyst in the lateral mid right kidney measuring 1.5 x 1.5 cm. There is an extrarenal pelvis on the right, an anatomic variant. There is mild fullness of the right renal collecting system. No similar fullness on the left. There is no evident intrarenal calculus. There is prominence of the right ureter. There is apparent impression in the mid right ureter due to markedly enlarged uterus with suspected infection in the mid abdomen and pelvis extending toward the right. No ureterectasis on the left. No ureteral calculus on either side. Urinary bladder is midline with wall thickness within normal limits. Stomach/Bowel: There is no appreciable bowel wall or mesenteric thickening. No evident bowel obstruction.  Terminal ileum appears normal. Appendix appears normal. There is no appreciable free air or portal venous air. Vascular/Lymphatic: There is no abdominal aortic aneurysm. There are occasional foci aortic atherosclerosis. There is no appreciable adenopathy in the abdomen pelvis. Reproductive: The uterus is anteverted. The uterus is diffusely enlarged, measuring 22.1 x 12.5 x 12.2 cm. There is extensive fluid and air throughout the uterus with an air-fluid level consistent with intrauterine abscess/emphysematous pyometritis. Note that there are foci of air within the wall of this enlarged uterus. There is no adnexal region mass. Other: No  ascites in the abdomen or pelvis. No abscess apart from the changes within the uterus noted. Musculoskeletal: There are no blastic or lytic bone lesions. Spinal stenosis is noted L3-4 and L4-5 due to disc protrusion and bony hypertrophy. No blastic or lytic bone lesions are evident. No abdominal wall or intramuscular lesions are evident. IMPRESSION: 1. Markedly enlarged uterus with extensive fluid and air within this enlarged uterus consistent with intrauterine abscess. Scattered foci of air elsewhere throughout the uterus and in the uterine wall. 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 Gynecologic assessment urged in this regard. 2. Fullness of the right renal collecting system and proximal to mid ureter likely due to compression of the right ureter by a markedly enlarged and apparently infected uterus. No renal or ureteral calculi on either side. Urinary bladder wall thickness normal. 3.  Cholelithiasis.  Suspect mild gallbladder wall thickening. 4. No bowel obstruction. No abscess apart from the changes in the uterus. Appendix appears normal. 5.  Small hiatal hernia. 6. Spinal stenosis L3-4 and L4-5 due to disc protrusion and bony hypertrophy. 7. Aortic Atherosclerosis (ICD10-I70.0). Foci of coronary artery calcification also noted.  8. There is a degree of adrenal hypertrophy bilaterally, a finding of uncertain significance. Electronically Signed   By: Lowella Grip III M.D.   On: 06/01/2021 09:00        Scheduled Meds:  aspirin EC  81 mg Oral Daily   atorvastatin  80 mg Oral Daily   DULoxetine  60 mg Oral Daily   insulin aspart  0-15 Units Subcutaneous Q4H   levothyroxine  50 mcg Oral QAC breakfast   pantoprazole  40 mg Oral Daily   potassium chloride  20 mEq Oral BID   Continuous Infusions:  cefTRIAXone (ROCEPHIN)  IV Stopped (06/01/21 1917)   lactated ringers     metronidazole 500 mg (06/02/21 1154)     LOS: 1 day    Time spent: 35 minutes    Edwin Dada, MD [facility] 06/02/2021, 1:38 PM     Please page though [place] or Epic secure chat:  For [employer], Adult nurse

## 2021-06-02 NOTE — Progress Notes (Signed)
Pt complained of ear pain. On call MD msg. MD unable to assess at this time. Notify MD in am to assess and accurately treat. Pt later called out for back pain #7. Tylenol given in ED. Provider contacted for additional pain meds orders. Orders placed for oxy and morphine for breakthrough.

## 2021-06-02 NOTE — Progress Notes (Signed)
   06/02/21 0019  Assess: MEWS Score  Temp 98.2 F (36.8 C)  BP (!) 155/91  Pulse Rate (!) 118  Resp 20  SpO2 97 %  O2 Device Nasal Cannula  O2 Flow Rate (L/min) 2 L/min  Assess: MEWS Score  MEWS Temp 0  MEWS Systolic 0  MEWS Pulse 2  MEWS RR 0  MEWS LOC 0  MEWS Score 2  MEWS Score Color Yellow  Assess: if the MEWS score is Yellow or Red  Were vital signs taken at a resting state? Yes  Focused Assessment No change from prior assessment  Early Detection of Sepsis Score *See Row Information* High  MEWS guidelines implemented *See Row Information* No, previously yellow, continue vital signs every 4 hours  Treat  MEWS Interventions Other (Comment) (monitored)  Notify: Charge Nurse/RN  Name of Charge Nurse/RN Notified Kennyth Lose  Date Charge Nurse/RN Notified 06/02/21  Time Charge Nurse/RN Notified 0100  Document  Patient Outcome Stabilized after interventions  No increase in Vitals signs. Pt was yellow in ED and MD aware prior to admitting.

## 2021-06-02 NOTE — Progress Notes (Signed)
No changes in patient condition, continue Q4 vitals  06/02/21 0746  Assess: MEWS Score  Temp 98.1 F (36.7 C)  BP 138/80  Pulse Rate (!) 123  Resp 18  SpO2 98 %  Assess: MEWS Score  MEWS Temp 0  MEWS Systolic 0  MEWS Pulse 2  MEWS RR 0  MEWS LOC 0  MEWS Score 2  MEWS Score Color Yellow  Assess: if the MEWS score is Yellow or Red  Were vital signs taken at a resting state? Yes  Focused Assessment No change from prior assessment  Early Detection of Sepsis Score *See Row Information* High  MEWS guidelines implemented *See Row Information* No, previously yellow, continue vital signs every 4 hours

## 2021-06-02 NOTE — Progress Notes (Addendum)
Benign Gynecology Progress Note  Admission Date: 06/01/2021 Current Date: 06/02/2021  Alexis Mclaughlin is a 75 y.o. No obstetric history on file. HD#1.   History complicated by: Patient Active Problem List   Diagnosis Date Noted   Hyponatremia 06/01/2021   Hypokalemia 06/01/2021   Chronic kidney disease 06/01/2021   Elevated AST (SGOT) 06/01/2021   Hypocalcemia 06/01/2021   Uterine abscess 06/01/2021   Sepsis ([place]) 06/01/2021   Rhabdomyolysis 06/01/2021   Frequent falls 06/01/2021   Diarrhea    Acute kidney injury ([place]) 02/10/2020   AKI (acute kidney injury) ([place]) 02/09/2020   Hypotension    Acute cystitis without hematuria    Chronic right shoulder pain    Hypothyroidism    Weakness    Type 2 diabetes mellitus with chronic kidney disease on chronic dialysis, without long-term current use of insulin ([place])    ROS and patient/family/surgical history, located on admission H&P note dated 06/01/2021, have been reviewed, and there are no changes except as noted below  Yesterday/Overnight Events:  Passage of clot/tissue from vagina noted this am by RN.  Large size.  Kept for examination and then pathology.  WBC has risen.   ABX- Vanc, Flagyl, Ceftriazxone  Subjective:  Pt denies pain and seems unawares of vaginal bleeding or discomforts.  Objective:   Temp:  [98 F (36.7 C)-101.8 F (38.8 C)] 98.1 F (36.7 C) (06/11 0746) Pulse Rate:  [108-126] 123 (06/11 0746) Resp:  [18-37] 18 (06/11 0746) BP: (125-166)/(67-91) 138/80 (06/11 0746) SpO2:  [93 %-98 %] 98 % (06/11 0746) Weight:  [110 kg] 110 kg (06/10 1656) I/O last 3 completed shifts: In: 3360 [P.O.:60; IV Piggyback:3300] Out: 9935 [Urine:1650] No intake/output data recorded.  Intake/Output Summary (Last 24 hours) at 06/02/2021 0908 Last data filed at 06/02/2021 0400 Gross per 24 hour  Intake 3360 ml  Output 1650 ml  Net 1710 ml   Physical exam: General appearance: alert, cooperative, and no distress Abdomen: soft,  non-tender; bowel sounds normal; no masses, no organomegaly GU: No gross VB Lungs: clear to auscultation bilaterally Heart: regular rate and rhythm and no MRGs Extremities: no redness or tenderness in the calves or thighs, no edema Skin: no lesions Psych: appropriate  Recent Labs  Lab 06/01/21 0806  NA 133*  K 3.0*  CL 98  CO2 21*  BUN 20  CREATININE 1.20*  GLUCOSE 185*   Recent Labs  Lab 06/01/21 0806 06/02/21 0541  WBC 25.3* 31.6*  HGB 10.2* 11.2*  HCT 31.8* 34.9*  PLT 378 384    Recent Labs  Lab 06/01/21 0806 06/01/21 1001  CALCIUM 8.1*  --   MG  --  1.6*   No results for input(s): INR, APTT in the last 168 hours.    Recent Labs  Lab 06/01/21 0806  ALKPHOS 160*  BILITOT 1.3*  PROT 7.0  ALT 25  AST 47*    Recent Labs  Lab 06/01/21 0806 06/02/21 0541  WBC 25.3* 31.6*  HGB 10.2* 11.2*  HCT 31.8* 34.9*  PLT 378 384   Recent Labs  Lab 06/01/21 0806  NA 133*  K 3.0*  CL 98  CO2 21*  BUN 20  CREATININE 1.20*  CALCIUM 8.1*  PROT 7.0  BILITOT 1.3*  ALKPHOS 160*  ALT 25  AST 47*  GLUCOSE 185*    Radiology See CT report and images  Assessment & Plan:  GYN- Pelvic / Uterine mass or abscess Malignancy in the differential As no improvements, transfer to [facility]yn Onc at [facility]  reasonable/ recommended. Clot/tissue will be sent to pathology Cont ABX Stable for transfer  Barnett Applebaum, MD, [facility], [facility] 06/02/2021  9:13 AM   Addendum: Discussed w Dr Duwayne Heck, [facility] Awaiting bed availability Plan Pelvic Ultrasound to assess any changes since earlier passage of clot/tissue Consideration for EUA, Dilation, Gentle Curettage if needed based on pt condition or US findings, or both  Barnett Applebaum, MD, [place], [facility] 06/02/2021  9:34 AM

## 2021-06-02 NOTE — Progress Notes (Signed)
   06/02/21 0349  Assess: MEWS Score  Temp 98 F (36.7 C)  BP (!) 163/76  Pulse Rate (!) 115  Resp 20  SpO2 95 %  O2 Device Nasal Cannula  O2 Flow Rate (L/min) 2 L/min  Assess: MEWS Score  MEWS Temp 0  MEWS Systolic 0  MEWS Pulse 2  MEWS RR 0  MEWS LOC 0  MEWS Score 2  MEWS Score Color Yellow  Assess: if the MEWS score is Yellow or Red  Were vital signs taken at a resting state? Yes  Escalate  MEWS: Escalate Yellow: discuss with charge nurse/RN and consider discussing with provider and RRT  Notify: Charge Nurse/RN  Name of Charge Nurse/RN Notified Kennyth Lose  Date Charge Nurse/RN Notified 06/02/21  Time Charge Nurse/RN Notified 0457  Document  Patient Outcome Other (Comment) (no change continue to monitor)  No change in Vitals. No change since admitted to floor.

## 2021-06-02 NOTE — Plan of Care (Signed)
  Problem: Fluid Volume: Goal: Hemodynamic stability will improve Outcome: Progressing   Problem: Clinical Measurements: Goal: Diagnostic test results will improve Outcome: Progressing Goal: Signs and symptoms of infection will decrease Outcome: Progressing   Problem: Respiratory: Goal: Ability to maintain adequate ventilation will improve Outcome: Progressing   Problem: Education: Goal: Ability to describe self-care measures that may prevent or decrease complications (Diabetes Survival Skills Education) will improve Outcome: Progressing Goal: Individualized Educational Video(s) Outcome: Progressing   Problem: Skin Integrity: Goal: Risk for impaired skin integrity will decrease Outcome: Progressing

## 2021-06-03 ENCOUNTER — Inpatient Hospital Stay (HOSPITAL_COMMUNITY)
Admission: AD | Admit: 2021-06-03 | Discharge: 2021-06-08 | DRG: 853 | Disposition: A | Discharge status: Skilled Nursing Facility | Payer: Medicare (Managed Care) | Source: Other Acute Inpatient Hospital | Attending: Internal Medicine | Admitting: Internal Medicine

## 2021-06-03 ENCOUNTER — Encounter (HOSPITAL_COMMUNITY): Payer: Self-pay | Admitting: Internal Medicine

## 2021-06-03 DIAGNOSIS — Z20822 Contact with and (suspected) exposure to covid-19: Secondary | ICD-10-CM | POA: Diagnosis present

## 2021-06-03 DIAGNOSIS — Z7982 Long term (current) use of aspirin: Secondary | ICD-10-CM

## 2021-06-03 DIAGNOSIS — N852 Hypertrophy of uterus: Secondary | ICD-10-CM | POA: Diagnosis present

## 2021-06-03 DIAGNOSIS — W19XXXA Unspecified fall, initial encounter: Secondary | ICD-10-CM | POA: Diagnosis present

## 2021-06-03 DIAGNOSIS — R7881 Bacteremia: Secondary | ICD-10-CM

## 2021-06-03 DIAGNOSIS — R531 Weakness: Secondary | ICD-10-CM | POA: Diagnosis not present

## 2021-06-03 DIAGNOSIS — E872 Acidosis: Secondary | ICD-10-CM | POA: Diagnosis present

## 2021-06-03 DIAGNOSIS — Z992 Dependence on renal dialysis: Secondary | ICD-10-CM | POA: Diagnosis not present

## 2021-06-03 DIAGNOSIS — I10 Essential (primary) hypertension: Secondary | ICD-10-CM | POA: Diagnosis present

## 2021-06-03 DIAGNOSIS — C55 Malignant neoplasm of uterus, part unspecified: Secondary | ICD-10-CM | POA: Diagnosis present

## 2021-06-03 DIAGNOSIS — M19011 Primary osteoarthritis, right shoulder: Secondary | ICD-10-CM | POA: Diagnosis present

## 2021-06-03 DIAGNOSIS — E876 Hypokalemia: Secondary | ICD-10-CM | POA: Diagnosis not present

## 2021-06-03 DIAGNOSIS — B967 Clostridium perfringens [C. perfringens] as the cause of diseases classified elsewhere: Secondary | ICD-10-CM | POA: Diagnosis not present

## 2021-06-03 DIAGNOSIS — E1122 Type 2 diabetes mellitus with diabetic chronic kidney disease: Secondary | ICD-10-CM | POA: Diagnosis not present

## 2021-06-03 DIAGNOSIS — Z794 Long term (current) use of insulin: Secondary | ICD-10-CM

## 2021-06-03 DIAGNOSIS — A414 Sepsis due to anaerobes: Principal | ICD-10-CM | POA: Diagnosis present

## 2021-06-03 DIAGNOSIS — Z66 Do not resuscitate: Secondary | ICD-10-CM | POA: Diagnosis present

## 2021-06-03 DIAGNOSIS — K219 Gastro-esophageal reflux disease without esophagitis: Secondary | ICD-10-CM | POA: Diagnosis present

## 2021-06-03 DIAGNOSIS — D649 Anemia, unspecified: Secondary | ICD-10-CM | POA: Diagnosis present

## 2021-06-03 DIAGNOSIS — Z6841 Body Mass Index (BMI) 40.0 and over, adult: Secondary | ICD-10-CM

## 2021-06-03 DIAGNOSIS — N186 End stage renal disease: Secondary | ICD-10-CM

## 2021-06-03 DIAGNOSIS — Z79899 Other long term (current) drug therapy: Secondary | ICD-10-CM

## 2021-06-03 DIAGNOSIS — E785 Hyperlipidemia, unspecified: Secondary | ICD-10-CM | POA: Diagnosis present

## 2021-06-03 DIAGNOSIS — N719 Inflammatory disease of uterus, unspecified: Secondary | ICD-10-CM

## 2021-06-03 DIAGNOSIS — Y92009 Unspecified place in unspecified non-institutional (private) residence as the place of occurrence of the external cause: Secondary | ICD-10-CM

## 2021-06-03 DIAGNOSIS — N858 Other specified noninflammatory disorders of uterus: Secondary | ICD-10-CM | POA: Diagnosis present

## 2021-06-03 DIAGNOSIS — Z87891 Personal history of nicotine dependence: Secondary | ICD-10-CM | POA: Diagnosis not present

## 2021-06-03 DIAGNOSIS — E039 Hypothyroidism, unspecified: Secondary | ICD-10-CM | POA: Diagnosis present

## 2021-06-03 DIAGNOSIS — Y838 Other surgical procedures as the cause of abnormal reaction of the patient, or of later complication, without mention of misadventure at the time of the procedure: Secondary | ICD-10-CM | POA: Diagnosis not present

## 2021-06-03 DIAGNOSIS — Z888 Allergy status to other drugs, medicaments and biological substances status: Secondary | ICD-10-CM | POA: Diagnosis not present

## 2021-06-03 DIAGNOSIS — F0390 Unspecified dementia without behavioral disturbance: Secondary | ICD-10-CM | POA: Diagnosis present

## 2021-06-03 DIAGNOSIS — Z79890 Hormone replacement therapy (postmenopausal): Secondary | ICD-10-CM

## 2021-06-03 DIAGNOSIS — E119 Type 2 diabetes mellitus without complications: Secondary | ICD-10-CM | POA: Diagnosis present

## 2021-06-03 DIAGNOSIS — J95821 Acute postprocedural respiratory failure: Secondary | ICD-10-CM | POA: Diagnosis not present

## 2021-06-03 DIAGNOSIS — E669 Obesity, unspecified: Secondary | ICD-10-CM | POA: Diagnosis present

## 2021-06-03 DIAGNOSIS — Z8249 Family history of ischemic heart disease and other diseases of the circulatory system: Secondary | ICD-10-CM | POA: Diagnosis not present

## 2021-06-03 DIAGNOSIS — T4145XA Adverse effect of unspecified anesthetic, initial encounter: Secondary | ICD-10-CM | POA: Diagnosis not present

## 2021-06-03 LAB — GLUCOSE, CAPILLARY
Glucose-Capillary: 164 mg/dL — ABNORMAL HIGH (ref 70–99)
Glucose-Capillary: 142 mg/dL — ABNORMAL HIGH (ref 70–99)
Glucose-Capillary: 216 mg/dL — ABNORMAL HIGH (ref 70–99)
Glucose-Capillary: 205 mg/dL — ABNORMAL HIGH (ref 70–99)
Glucose-Capillary: 138 mg/dL — ABNORMAL HIGH (ref 70–99)

## 2021-06-03 LAB — CBC
HCT: 31.7 % — ABNORMAL LOW (ref 36.0–46.0)
Hemoglobin: 10.1 g/dL — ABNORMAL LOW (ref 12.0–15.0)
MCH: 24.1 pg — ABNORMAL LOW (ref 26.0–34.0)
MCHC: 31.9 g/dL (ref 30.0–36.0)
MCV: 75.7 fL — ABNORMAL LOW (ref 80.0–100.0)
Platelets: 379 10*3/uL (ref 150–400)
RBC: 4.19 MIL/uL (ref 3.87–5.11)
RDW: 15.3 % (ref 11.5–15.5)
WBC: 23.8 10*3/uL — ABNORMAL HIGH (ref 4.0–10.5)
nRBC: 0 % (ref 0.0–0.2)

## 2021-06-03 LAB — BASIC METABOLIC PANEL
Anion gap: 8 (ref 5–15)
BUN: 22 mg/dL (ref 8–23)
CO2: 25 mmol/L (ref 22–32)
Calcium: 7.7 mg/dL — ABNORMAL LOW (ref 8.9–10.3)
Chloride: 105 mmol/L (ref 98–111)
Creatinine, Ser: 0.88 mg/dL (ref 0.44–1.00)
GFR, Estimated: >60 mL/min (ref >60)
Glucose, Bld: 146 mg/dL — ABNORMAL HIGH (ref 70–99)
Potassium: 3.4 mmol/L — ABNORMAL LOW (ref 3.5–5.1)
Sodium: 138 mmol/L (ref 135–145)

## 2021-06-03 MED ORDER — GUAIFENESIN-DM 100-10 MG/5ML PO SYRP: 5.0000 mL | ORAL_SOLUTION | ORAL | 0 refills | Status: DC | PRN

## 2021-06-03 MED ORDER — SODIUM CHLORIDE 0.9 % IV SOLN
3.0000 g | Freq: Four times a day (QID) | INTRAVENOUS | Status: DC
  Administered 2021-06-03: 17:00:00 3 g via INTRAVENOUS
  Filled 2021-06-03: qty 8
  Filled 2021-06-03: qty 3
  Filled 2021-06-03 (×2): qty 8

## 2021-06-03 MED ORDER — FENTANYL CITRATE (PF) 100 MCG/2ML IJ SOLN
25.0000 ug | INTRAMUSCULAR | Status: DC | PRN
  Administered 2021-06-04 – 2021-06-05 (×3): 25 ug via INTRAVENOUS
  Filled 2021-06-03 (×3): qty 2

## 2021-06-03 MED ORDER — ACETAMINOPHEN 325 MG PO TABS: 650.0000 mg | ORAL_TABLET | Freq: Four times a day (QID) | ORAL | Status: DC | PRN

## 2021-06-03 MED ORDER — SODIUM CHLORIDE 0.9 % IV SOLN
3.0000 g | Freq: Four times a day (QID) | INTRAVENOUS | Status: DC
  Administered 2021-06-03 – 2021-06-06 (×10): 3 g via INTRAVENOUS
  Filled 2021-06-03 (×2): qty 3
  Filled 2021-06-03: qty 8
  Filled 2021-06-03: qty 3
  Filled 2021-06-03: qty 8
  Filled 2021-06-03: qty 3
  Filled 2021-06-03: qty 8
  Filled 2021-06-03 (×4): qty 3
  Filled 2021-06-03 (×2): qty 8

## 2021-06-03 MED ORDER — MUSCLE RUB 10-15 % EX CREA
TOPICAL_CREAM | CUTANEOUS | Status: DC | PRN
  Administered 2021-06-03: 1 via TOPICAL
  Filled 2021-06-03: qty 85

## 2021-06-03 MED ORDER — INSULIN ASPART 100 UNIT/ML IJ SOLN: 0.0000 [IU] | INTRAMUSCULAR | 11 refills | Status: DC

## 2021-06-03 MED ORDER — ACETAMINOPHEN 325 MG PO TABS
650.0000 mg | ORAL_TABLET | Freq: Four times a day (QID) | ORAL | Status: DC | PRN
  Administered 2021-06-03 – 2021-06-08 (×4): 650 mg via ORAL
  Filled 2021-06-03 (×4): qty 2

## 2021-06-03 MED ORDER — FLUTICASONE PROPIONATE 50 MCG/ACT NA SUSP
2.0000 | Freq: Every day | NASAL | Status: DC
  Administered 2021-06-04 – 2021-06-07 (×3): 2 via NASAL
  Filled 2021-06-03: qty 16

## 2021-06-03 MED ORDER — POTASSIUM CHLORIDE CRYS ER 20 MEQ PO TBCR: 20.0000 meq | EXTENDED_RELEASE_TABLET | Freq: Two times a day (BID) | ORAL | Status: DC

## 2021-06-03 MED ORDER — PANTOPRAZOLE SODIUM 40 MG PO TBEC
40.0000 mg | DELAYED_RELEASE_TABLET | Freq: Every day | ORAL | Status: DC
  Administered 2021-06-04 – 2021-06-08 (×4): 40 mg via ORAL
  Filled 2021-06-03 (×4): qty 1

## 2021-06-03 MED ORDER — GUAIFENESIN-DM 100-10 MG/5ML PO SYRP
5.0000 mL | ORAL_SOLUTION | ORAL | Status: DC | PRN
  Administered 2021-06-03: 13:00:00 5 mL via ORAL
  Filled 2021-06-03: qty 5

## 2021-06-03 MED ORDER — SODIUM CHLORIDE 0.9 % IV SOLN: 3.0000 g | Freq: Four times a day (QID) | INTRAVENOUS | Status: DC

## 2021-06-03 MED ORDER — LEVOTHYROXINE SODIUM 50 MCG PO TABS
50.0000 ug | ORAL_TABLET | Freq: Every day | ORAL | Status: DC
  Administered 2021-06-04 – 2021-06-08 (×5): 50 ug via ORAL
  Filled 2021-06-03 (×5): qty 1

## 2021-06-03 MED ORDER — NALOXONE HCL 0.4 MG/ML IJ SOLN: 0.4000 mg | INTRAMUSCULAR | Status: DC | PRN

## 2021-06-03 MED ORDER — METOPROLOL SUCCINATE ER 25 MG PO TB24
12.5000 mg | ORAL_TABLET | Freq: Every day | ORAL | Status: DC
  Administered 2021-06-04 – 2021-06-08 (×5): 12.5 mg via ORAL
  Filled 2021-06-03 (×5): qty 1

## 2021-06-03 MED ORDER — OXYCODONE HCL 5 MG PO TABS: 5.0000 mg | ORAL_TABLET | ORAL | 0 refills | Status: DC | PRN

## 2021-06-03 MED ORDER — ATORVASTATIN CALCIUM 40 MG PO TABS
80.0000 mg | ORAL_TABLET | Freq: Every day | ORAL | Status: DC
  Administered 2021-06-04 – 2021-06-08 (×4): 80 mg via ORAL
  Filled 2021-06-03 (×4): qty 2

## 2021-06-03 MED ORDER — DULOXETINE HCL 60 MG PO CPEP
60.0000 mg | ORAL_CAPSULE | Freq: Every day | ORAL | Status: DC
  Administered 2021-06-04 – 2021-06-08 (×4): 60 mg via ORAL
  Filled 2021-06-03 (×4): qty 1

## 2021-06-03 MED ORDER — INSULIN ASPART 100 UNIT/ML IJ SOLN
0.0000 [IU] | Freq: Three times a day (TID) | INTRAMUSCULAR | Status: DC
  Administered 2021-06-04 – 2021-06-06 (×5): 1 [IU] via SUBCUTANEOUS
  Administered 2021-06-06: 2 [IU] via SUBCUTANEOUS
  Administered 2021-06-06 – 2021-06-07 (×3): 1 [IU] via SUBCUTANEOUS

## 2021-06-03 MED ORDER — ACETAMINOPHEN 650 MG RE SUPP: 650.0000 mg | Freq: Four times a day (QID) | RECTAL | Status: DC | PRN

## 2021-06-03 NOTE — Progress Notes (Signed)
   06/03/21 0815  Assess: MEWS Score  Temp 97.9 F (36.6 C)  BP 134/77  Pulse Rate (!) 103  Resp 16  SpO2 98 %  O2 Device Nasal Cannula  Assess: MEWS Score  MEWS Temp 0  MEWS Systolic 0  MEWS Pulse 1  MEWS RR 0  MEWS LOC 0  MEWS Score 1  MEWS Score Color Green  Assess: if the MEWS score is Yellow or Red  Were vital signs taken at a resting state? Yes  Focused Assessment Change from prior assessment (see assessment flowsheet)  MEWS guidelines implemented *See Row Information* No, vital signs rechecked  Treat  Pain Scale 0-10  Pain Score 0  Document  Patient Outcome Stabilized after interventions

## 2021-06-03 NOTE — Progress Notes (Addendum)
   06/02/21 2100  Assess: MEWS Score  Temp 98.6 F (37 C)  BP (!) 147/64  Pulse Rate (!) 122  Resp 20  Assess: MEWS Score  MEWS Temp 0  MEWS Systolic 0  MEWS Pulse 2  MEWS RR 0  MEWS LOC 0  MEWS Score 2  MEWS Score Color Yellow  Assess: if the MEWS score is Yellow or Red  Were vital signs taken at a resting state? Yes  Focused Assessment No change from prior assessment  Early Detection of Sepsis Score *See Row Information* High  MEWS guidelines implemented *See Row Information* No, previously yellow, continue vital signs every 4 hours  Treat  Pain Scale 0-10  Pain Score 0  Escalate  MEWS: Escalate Yellow: discuss with charge nurse/RN and consider discussing with provider and RRT  Notify: Charge Nurse/RN  Name of Charge Nurse/RN Notified Jackiet  Date Charge Nurse/RN Notified 06/03/21  Time Charge Nurse/RN Notified 2100  Document  Patient Outcome Stabilized after interventions  No change since admission vitals. Admitting MD aware of heart rate Charge RN Kennyth Lose aware 06/02/21

## 2021-06-03 NOTE — Progress Notes (Signed)
Pharmacy Antibiotic Note  Alexis Mclaughlin is a 75 y.o. female admitted on 06/01/2021. Pharmacy has been consulted for Unasyn dosing for intra-abdominal infection.  Plan: Unasyn 3 g IV q6h  Height: 5\' 3"  (160 cm) Weight: 110 kg (242 lb 8.1 oz) IBW/kg (Calculated) : 52.4  Temp (24hrs), Avg:98.2 F (36.8 C), Min:97.9 F (36.6 C), Max:98.6 F (37 C)  Recent Labs  Lab 06/01/21 0806 06/01/21 1006 06/02/21 0541 06/03/21 0614  WBC 25.3*  --  31.6* 23.8*  CREATININE 1.20*  --  0.80 0.88  LATICACIDVEN 3.7* 2.3*  --   --     Estimated Creatinine Clearance: 64.7 mL/min (by C-G formula based on SCr of 0.88 mg/dL).    Allergies  Allergen Reactions   Ace Inhibitors     Antimicrobials this admission: Cefepime 6/10 >> 6/10 Ceftriaxone 6/10 >> 6/12 Metronidazole 6/10 >> 6/12 Unasyn 6/12 >>   Microbiology results: 6/10 BCx: 2/2 sets clostridium perfringens  Thank you for allowing pharmacy to be a part of this patient's care.  Tawnya Crook, PharmD 06/03/2021 3:20 PM

## 2021-06-03 NOTE — Progress Notes (Signed)
[facility] PROGRESS NOTE    Alexis Mclaughlin  JAS:[id number] DOB: [birth year] DOA: 06/01/2021 PCP: Patient, No Pcp Per (Inactive)      Brief Narrative:  Alexis Mclaughlin is a 75 y.o. F with obesity, DM, HTN, and hypothyroidism who presented with pelvic discomfort and vaginal discharge.  Evidently the patient developed some symptoms in the last several days, that she thought her UTI (hard to understand but I gather that this was vaginal discharge and pelvic discomfort and blood in underwear).  She had only completed 1 dose of macrobid when she fell and was too weak to get up and was brought to the ER.    In the ER, she had fever, tachycardia, and a CT of the abdomen pelvis showed emphysematous pyelonephritis.  The case was discussed with gynecology at [facility] who recommended treatment with IV antibiotics and possible transfer for follow-up.             Assessment & Plan:  Emphysematous pyometritis Passed material yesterday, not much furhter drainage.  [place] of pelvis shows enlarged uterus.  Still tachycardic, WBC trending down.    - Continue Rocephin and Flagyl - Consult OB/GYN for dilation - Stop IV fluids - Tissue from cervix has been sent for pathology     Well-controlled diabetes with, type II without complication, non-insulin-dependent Glucose normal here - Continue sliding scale correction insulin - Continue aspirin and atorvastatin  Hypertebsion BP normal - Hold home amlodipine  Hypothyroidism - Continue levothyroxine  Mood disorder - Continue duloxetine  Hyponatremia Asymptomatic  Hypomagnesemia, hypokalemia Potassium improving - Continue potassium - Recheck mag  Iron deficiency anemia Hemoglobin trending down, but minimal bleeding clinically - Check iron studies  CKD ruled out Baseline creatinine is 0.9  Obesity BMI 42    Disposition: Status is: Inpatient  Remains inpatient appropriate because: remains tachycardic, may need  dilation, certainly needs more IV antibiotics  Dispo: The patient is from: Home              Anticipated d/c is to: Home              Patient currently is not medically stable to d/c.   Difficult to place patient No   Patient admitted with pyometritis.  Remains tachycardic.  If able to dilate and drain the uterus, she may be able to stabilize on antibiotics and can follow-up with GYN oncology as an outpatient.  Otherwise will need transferred to [facility] overdue for cardiac consultation     Level of care: Med-Surg       MDM: The below labs and imaging reports were reviewed and summarized above.  Medication management as above.    DVT prophylaxis: SCDs Start: 06/01/21 1208  Code Status: DNR Family Communication:                 Subjective: She complains of some right shoulder pain, but otherwise has no vaginal bleeding, no passage of purulent material, no fever, no confusion, no chest pain or dyspnea.  She has a cough as well.     Objective: Vitals:   06/02/21 2100 06/03/21 0000 06/03/21 0330 06/03/21 0815  BP: (!) 147/64 131/80 135/72 134/77  Pulse: (!) 122 (!) 109 (!) 115 (!) 103  Resp: 20 16 18 16   Temp: 98.6 F (37 C) 98.3 F (36.8 C) 98.2 F (36.8 C) 97.9 F (36.6 C)  TempSrc: Oral     SpO2:  97% 97% 98%  Weight:      Height:  Intake/Output Summary (Last 24 hours) at 06/03/2021 0944 Last data filed at 06/03/2021 0600 Gross per 24 hour  Intake 1711.15 ml  Output 100 ml  Net 1611.15 ml   Filed Weights   06/01/21 0804 06/01/21 1656  Weight: 110.2 kg 110 kg    Examination: General appearance: Obese elderly female, lying in bed, eating breakfast, no acute distress, watching television     HEENT: Anicteric, conjunctival pink, lids and lashes normal.  No nasal deformity, discharge, or epistaxis, edentulous, lips normal, no oral lesions Skin: Warm and dry, no suspicious rashes or lesions Cardiac: Tachycardic, irregular, no murmurs, JVP  not visible, no lower extremity edema Respiratory: Normal respiratory rate and rhythm, lungs clear without rales or wheezes Abdomen: Abdomen soft without tenderness palpation or guarding, no ascites or distention MSK:  Neuro: Awake and alert, extraocular movements intact, moves all extremities with normal strength and coordination, speech fluent Psych: Sensorium intact responding questions, judgment insight may be impaired.  Mostly seem normal         Data Reviewed: I have personally reviewed following labs and imaging studies:  CBC: Recent Labs  Lab 06/01/21 0806 06/02/21 0541 06/03/21 0614  WBC 25.3* 31.6* 23.8*  NEUTROABS 21.4*  --   --   HGB 10.2* 11.2* 10.1*  HCT 31.8* 34.9* 31.7*  MCV 75.4* 73.9* 75.7*  PLT 378 384 630   Basic Metabolic Panel: Recent Labs  Lab 06/01/21 0806 06/01/21 1001 06/02/21 0541 06/03/21 0614  NA 133*  --  136 138  K 3.0*  --  2.8* 3.4*  CL 98  --  103 105  CO2 21*  --  23 25  GLUCOSE 185*  --  148* 146*  BUN 20  --  12 22  CREATININE 1.20*  --  0.80 0.88  CALCIUM 8.1*  --  7.9* 7.7*  MG  --  1.6*  --   --    GFR: Estimated Creatinine Clearance: 64.7 mL/min (by C-G formula based on SCr of 0.88 mg/dL). Liver Function Tests: Recent Labs  Lab 06/01/21 0806 06/02/21 0541  AST 47* 85*  ALT 25 32  ALKPHOS 160* 179*  BILITOT 1.3* 1.6*  PROT 7.0 6.8  ALBUMIN 3.0* 2.6*   No results for input(s): LIPASE, AMYLASE in the last 168 hours. No results for input(s): AMMONIA in the last 168 hours. Coagulation Profile: No results for input(s): INR, PROTIME in the last 168 hours. Cardiac Enzymes: Recent Labs  Lab 06/01/21 0806  CKTOTAL 935*   BNP (last 3 results) No results for input(s): PROBNP in the last 8760 hours. HbA1C: Recent Labs    06/01/21 0806  HGBA1C 7.5*   CBG: Recent Labs  Lab 06/02/21 1615 06/02/21 2009 06/02/21 2354 06/03/21 0405 06/03/21 0817  GLUCAP 165* 200* 173* 164* 142*   Lipid Profile: No results  for input(s): CHOL, HDL, LDLCALC, TRIG, CHOLHDL, LDLDIRECT in the last 72 hours. Thyroid Function Tests: No results for input(s): TSH, T4TOTAL, FREET4, T3FREE, THYROIDAB in the last 72 hours. Anemia Panel: No results for input(s): VITAMINB12, FOLATE, FERRITIN, TIBC, IRON, RETICCTPCT in the last 72 hours. Urine analysis:    Component Value Date/Time   COLORURINE YELLOW (A) 06/01/2021 0803   APPEARANCEUR HAZY (A) 06/01/2021 0803   LABSPEC 1.011 06/01/2021 0803   PHURINE 5.0 06/01/2021 0803   GLUCOSEU NEGATIVE 06/01/2021 0803   HGBUR LARGE (A) 06/01/2021 0803   BILIRUBINUR NEGATIVE 06/01/2021 0803   KETONESUR NEGATIVE 06/01/2021 0803   PROTEINUR 30 (A) 06/01/2021 0803   NITRITE  NEGATIVE 06/01/2021 0803   LEUKOCYTESUR MODERATE (A) 06/01/2021 0803   Sepsis Labs: @LABRCNTIP (procalcitonin:4,lacticacidven:4)  ) Recent Results (from the past 240 hour(s))  Blood culture (routine x 2)     Status: None (Preliminary result)   Collection Time: 06/01/21  8:06 AM   Specimen: BLOOD  Result Value Ref Range Status   Specimen Description   Final    BLOOD RIGHT ANTECUBITAL Performed at [facility], [address] [place], [place] [address]    Special Requests   Final    BOTTLES DRAWN AEROBIC AND ANAEROBIC Blood Culture adequate volume Performed at [facility], [address], [place], [place] [address]    Culture  Setup Time   Final    GRAM POSITIVE RODS ANAEROBIC BOTTLE ONLY CRITICAL RESULT CALLED TO, READ BACK BY AND VERIFIED WITH: KISHAN PATEL @1610  ON 06.10.22.[place]    Culture   Final    GRAM POSITIVE RODS CULTURE REINCUBATED FOR BETTER GROWTH Performed at [facility], [place] [address], [place], [place] [address]    Report Status PENDING  Incomplete  Blood culture (routine x 2)     Status: None (Preliminary result)   Collection Time: 06/01/21  8:06 AM   Specimen: BLOOD RIGHT HAND  Result Value Ref Range Status   Specimen Description   Final    BLOOD RIGHT  HAND Performed at [facility], [address], [place], [place] [address]    Special Requests   Final    BOTTLES DRAWN AEROBIC AND ANAEROBIC Blood Culture results may not be optimal due to an inadequate volume of blood received in culture bottles Performed at [facility], [address], [place], [place] [address]    Culture  Setup Time ANAEROBIC BOTTLE ONLY GRAM POSITIVE RODS   Final   Culture   Final    GRAM POSITIVE RODS CULTURE REINCUBATED FOR BETTER GROWTH Performed at [facility], [place] [address], [place], [place] [address]    Report Status PENDING  Incomplete  Resp Panel by RT-PCR (Flu A&B, Covid) Nasopharyngeal Swab     Status: None   Collection Time: 06/01/21  8:06 AM   Specimen: Nasopharyngeal Swab; Nasopharyngeal(NP) swabs in vial transport medium  Result Value Ref Range Status   SARS Coronavirus 2 by RT PCR NEGATIVE NEGATIVE Final    Comment: (NOTE) SARS-CoV-2 target nucleic acids are NOT DETECTED.  The SARS-CoV-2 RNA is generally detectable in upper respiratory specimens during the acute phase of infection. The lowest concentration of SARS-CoV-2 viral copies this assay can detect is 138 copies/mL. A negative result does not preclude SARS-Cov-2 infection and should not be used as the sole basis for treatment or other patient management decisions. A negative result may occur with  improper specimen collection/handling, submission of specimen other than nasopharyngeal swab, presence of viral mutation(s) within the areas targeted by this assay, and inadequate number of viral copies(<138 copies/mL). A negative result must be combined with clinical observations, patient history, and epidemiological information. The expected result is Negative.  Fact Sheet for Patients:  EntrepreneurPulse.com.au  Fact Sheet for Healthcare Providers:  IncredibleEmployment.be  This test is no t yet approved or cleared by the  [place] FDA and  has been authorized for detection and/or diagnosis of SARS-CoV-2 by FDA under an Emergency Use Authorization (EUA). This EUA will remain  in effect (meaning this test can be used) for the duration of the COVID-19 declaration under Section 564(b)(1) of the Act, 21 U.S.C.section 360bbb-3(b)(1), unless the  authorization is terminated  or revoked sooner.       Influenza A by PCR NEGATIVE NEGATIVE Final   Influenza B by PCR NEGATIVE NEGATIVE Final    Comment: (NOTE) The Xpert Xpress SARS-CoV-2/FLU/RSV plus assay is intended as an aid in the diagnosis of influenza from Nasopharyngeal swab specimens and should not be used as a sole basis for treatment. Nasal washings and aspirates are unacceptable for Xpert Xpress SARS-CoV-2/FLU/RSV testing.  Fact Sheet for Patients: EntrepreneurPulse.com.au  Fact Sheet for Healthcare Providers: IncredibleEmployment.be  This test is not yet approved or cleared by the [place] FDA and has been authorized for detection and/or diagnosis of SARS-CoV-2 by FDA under an Emergency Use Authorization (EUA). This EUA will remain in effect (meaning this test can be used) for the duration of the COVID-19 declaration under Section 564(b)(1) of the Act, 21 U.S.C. section 360bbb-3(b)(1), unless the authorization is terminated or revoked.  Performed at [facility], [address], [place], [place] [address]          Radiology Studies: DG Chest 1 View  Result Date: 06/01/2021 CLINICAL DATA:  Increasing hypoxia.  Ex-smoker. EXAM: CHEST  1 VIEW COMPARISON:  06/01/2021 FINDINGS: Poor inspiration, decreased. Stable mildly enlarged cardiac silhouette and that stable mildly enlarged cardiac silhouette. Mild increase in prominence of the pulmonary vasculature and interstitial markings due to the poor inspiration. No pleural fluid. Thoracic spine degenerative changes. Bilateral shoulder  degenerative changes. IMPRESSION: No acute abnormality. Stable mild cardiomegaly and mild interstitial lung disease. Electronically Signed   By: Claudie Revering M.D.   On: 06/01/2021 14:55   US PELVIS (TRANSABDOMINAL ONLY)  Addendum Date: 06/02/2021   ADDENDUM REPORT: 06/02/2021 12:21 ADDENDUM: These results were called by telephone at the time of interpretation on 06/02/2021 at 12:21 pm to provider [facility] , who verbally acknowledged these results. Electronically Signed   By: Marin Olp M.D.   On: 06/02/2021 12:21   Result Date: 06/02/2021 CLINICAL DATA:  Possible uterine mass. EXAM: TRANSABDOMINAL ULTRASOUND OF PELVIS TECHNIQUE: Transabdominal ultrasound examination of the pelvis was performed including evaluation of the uterus, ovaries, adnexal regions, and pelvic cul-de-sac. COMPARISON:  CT 06/01/2021 FINDINGS: Uterus Measurements: 17.9 x 8.7 x 12.0 cm = volume: 975 mL. Examination demonstrates similar findings to that seen on recent CT scan with moderate amount of air throughout the central visualized portion of the uterus obscuring the endometrium and adjacent parametrial tissue/myometrium. Findings are compatible with gas-forming infection. Endometrium Not visualized as obscured due to moderate amounts of air as described. Right ovary Not visualized. Left ovary Not visualized. Other findings:  No abnormal free fluid. IMPRESSION: Moderately enlarged uterus containing moderate amount of air obscuring the endometrium and adjacent myometrium compatible gas-forming infection as shown on recent CT. Ovaries not visualized. Electronically Signed: By: Marin Olp M.D. On: 06/02/2021 12:07        Scheduled Meds:  aspirin EC  81 mg Oral Daily   atorvastatin  80 mg Oral Daily   DULoxetine  60 mg Oral Daily   insulin aspart  0-15 Units Subcutaneous Q4H   levothyroxine  50 mcg Oral QAC breakfast   pantoprazole  40 mg Oral Daily   potassium chloride  20 mEq Oral BID   Continuous Infusions:   cefTRIAXone (ROCEPHIN)  IV Stopped (06/01/21 1917)   metronidazole 500 mg (06/03/21 0411)     LOS: 2 days    Time spent: 25 minutes    Edwin Dada, MD [facility] 06/03/2021, 9:44 AM  Please page though [place] or Epic secure chat:  For [employer], Adult nurse

## 2021-06-03 NOTE — Progress Notes (Signed)
RN received phone call from Fraser long for report, report given to [employer]

## 2021-06-03 NOTE — Progress Notes (Signed)
   06/03/21 0330  Assess: MEWS Score  Temp 98.2 F (36.8 C)  BP 135/72  Pulse Rate (!) 115  Resp 18  Level of Consciousness Alert  SpO2 97 %  Assess: MEWS Score  MEWS Temp 0  MEWS Systolic 0  MEWS Pulse 2  MEWS RR 0  MEWS LOC 0  MEWS Score 2  MEWS Score Color Yellow  Assess: if the MEWS score is Yellow or Red  Were vital signs taken at a resting state? Yes  Focused Assessment No change from prior assessment  Early Detection of Sepsis Score *See Row Information* High  MEWS guidelines implemented *See Row Information* No, previously yellow, continue vital signs every 4 hours  Notify: Charge Nurse/RN  Name of Charge Nurse/RN Notified Kennyth Lose  Date Charge Nurse/RN Notified 06/03/21  Time Charge Nurse/RN Notified 0424  Document  Patient Outcome Stabilized after interventions

## 2021-06-03 NOTE — Progress Notes (Signed)
Pharmacy Antibiotic Note  Alexis Mclaughlin is a 75 y.o. female admitted on 06/03/2021 with Clostridium perfringens bacteremia; (per ID consult at [facility] prior to transfer).  Patient found to have a uterine abscess, and passed some cervical tissue which has been sent for pathology.  Pharmacy has been consulted for Unasyn dosing.  Plan: Unasyn 3gm IV q6h Follow renal function  Height: 5\' 4"  (162.6 cm) Weight: 111.6 kg (246 lb 0.5 oz) IBW/kg (Calculated) : 54.7  Temp (24hrs), Avg:98.4 F (36.9 C), Min:97.9 F (36.6 C), Max:98.8 F (37.1 C)  Recent Labs  Lab 06/01/21 0806 06/01/21 1006 06/02/21 0541 06/03/21 0614  WBC 25.3*  --  31.6* 23.8*  CREATININE 1.20*  --  0.80 0.88  LATICACIDVEN 3.7* 2.3*  --   --     Estimated Creatinine Clearance: 66.5 mL/min (by C-G formula based on SCr of 0.88 mg/dL).    Allergies  Allergen Reactions   Ace Inhibitors     Antimicrobials this admission: 6/10 Metronidazole >> 6/12 6/10 Ceftriaxone >> 6/12 6/12 Unasyn >>  Dose adjustments this admission:   Microbiology results: 6/10 BCx: Clostridium Perfringens   Thank you for allowing pharmacy to be a part of this patient's care.  Everette Rank, PharmD 06/03/2021 10:27 PM

## 2021-06-03 NOTE — H&P (Signed)
History and Physical    PLEASE NOTE THAT DRAGON DICTATION SOFTWARE WAS USED IN THE CONSTRUCTION OF THIS NOTE.   Alexis Mclaughlin ELF:[id number] DOB: [birth year] DOA: 06/03/2021  PCP: Patient, No Pcp Per (Inactive) Patient coming from: [facility]  I have personally briefly reviewed patient's old medical records in [place]  Chief Complaint: Uterine abscess  HPI: Alexis Mclaughlin is a 75 y.o. female with medical history significant for type 2 diabetes mellitus, hypertension, hypothyroidism, chronic anemia with baseline hemoglobin 10-12, hyperlipidemia, who is admitted to [facility] on 06/03/2021 by way of transfer from [facility] for further evaluation of uterine abscess after presenting from home to the facility on 06/01/2021 complaining of pelvic pain.   The patient was admitted to [facility] on 06/01/2021 after presenting to the emergency department complaining of pelvic pain.  Patient reportedly notified her PCP of this symptom, empiric Macrobid was initiated in the absence of interval urinalysis for presumed UTI.  She reportedly 1 dose of Macrobid, but noted no ensuing improvement in her pelvic pain.  In the absence of no improvement in this discomfort and in the setting of generalized weakness, the patient presented to [facility] ED on 06/01/2021 for further evaluation of the above.  The patient was found to be septic in the emergency department with presenting fever and tachycardia, with ensuing imaging demonstrating a uterine abscess.  Patient was admitted to the hospitalist service for further evaluation management of the above. blood cultures drawn on the day of admission subsequently found to be positive for Clostridium perfringens.  Hospitalist at [facility] discussed the patient's case and imaging with Dr.Secord of Gyn-Onc at [facility] as well as Dr.Tucker Of Gyn Onc through [facility] health, with both subspecialist recommending conservative measures including IV antibiotics while allowing time for the uterine abscess  to spontaneously drain.  However, recommend consideration for transfer for formal Amada Kingfisher consultation if no subsequent clinical improvement.  Additionally, infectious disease at [facility] was consulted, including for recommendations regarding approach to IV antibiotics in the setting of Clostridium perfringens bacteremia, with ensuing ID suspicion for underlying malignancy given the patient's and corresponding recommendations to narrow current antibiotic spectrum to Unasyn, upon which the patient was ultimately transferred to [facility].  No additional source of infection was identified work-up performed during [facility] hospitalization, including urinalysis that was reportedly not consistent with UTI.  Additionally, screening COVID-19 PCR was performed on 06/01/2021 and found to be negative presenting chest x-ray today was not suggestive of underlying pneumonia.  In the absence of any interval evidence of spontaneous drainage of patient's urine abscess, and in the setting of persistence of tachycardia and spite of interval IV antibiotics, the patient was felt to not be improving clinically ultimately she was transferred to [facility] for further evaluation and management of uterine abscess including need for formal,onsite gynecologic consultation, will transferring Hospitalist recommendation to call Gyn-Onc on the morning of 06/04/21.Marland Kitchen  Her hospital course at [facility] was also notable for hypokalemia and hypomagnesemia status post supplementation efforts.   At the time of her arrival at [facility] this evening, the patient denies any significant abdominal or pelvic discomfort at this time.  She also denies any current nausea, vomiting, or diarrhea.    Review of Systems: As per HPI otherwise 10 point review of systems negative.   Past Medical History:  Diagnosis Date   Dementia ([place])    Diabetes mellitus without complication ([place])    Hypertension    Hypothyroidism     Past  Surgical  History:  Procedure Laterality Date   BREAST BIOPSY Right 09/02/2018   affirm stereo/path pending    Social History:  reports that she has quit smoking. She has never used smokeless tobacco. She reports previous alcohol use. She reports that she does not use drugs.   Allergies  Allergen Reactions   Ace Inhibitors     Family History  Problem Relation Age of Onset   CAD Mother    CAD Father     Family history reviewed and not pertinent    Prior to Admission medications   Medication Sig Start Date End Date Taking? Authorizing Provider  acetaminophen (TYLENOL) 650 MG CR tablet Take 650 mg by mouth daily.   Yes [provider]  amLODipine (NORVASC) 10 MG tablet Take 10 mg by mouth daily.   Yes [provider]  aspirin EC 81 MG tablet Take 81 mg by mouth daily.   Yes [provider]  cholecalciferol (VITAMIN D3) 25 MCG (1000 UNIT) tablet Take 1,000 Units by mouth daily.   Yes [provider]  docusate sodium (COLACE) 100 MG capsule Take 100 mg by mouth daily.   Yes [provider]  DULoxetine (CYMBALTA) 60 MG capsule Take 60 mg by mouth daily.   Yes [provider]  levothyroxine (SYNTHROID) 50 MCG tablet Take 50 mcg by mouth daily before breakfast.   Yes [provider]  lidocaine (LIDODERM) 5 % Place 2 patches onto the skin daily as needed (pain). Remove & Discard patch within 12 hours or as directed by MD   Yes [provider]  LIPITOR 80 MG tablet Take 1 tablet by mouth daily. 04/11/21  Yes [provider]  metoprolol succinate (TOPROL-XL) 25 MG 24 hr tablet Take 12.5 mg by mouth daily.   Yes [provider]  nitrofurantoin (MACRODANTIN) 100 MG capsule Take 100 mg by mouth 2 (two) times daily.   Yes [provider]  omeprazole (PRILOSEC) 20 MG capsule Take 20 mg by mouth daily.   Yes [provider]  acetaminophen (TYLENOL) 325 MG tablet Take 2 tablets (650 mg total) by mouth  every 6 (six) hours as needed for headache, fever or mild pain. 06/03/21   Danford, Suann Larry, MD  Ampicillin-Sulbactam 3 g in sodium chloride 0.9 % 100 mL Inject 3 g into the vein every 6 (six) hours. 06/03/21   Danford, Suann Larry, MD  camphor-menthol [facility]) lotion Apply 1 application topically 3 (three) times daily as needed for itching.    [provider]  diclofenac Sodium (VOLTAREN) 1 % GEL Apply to right shoulder and neck three times a day Patient not taking: Reported on 06/03/2021 02/14/20   Loletha Grayer, MD  fluticasone [facility]) 50 MCG/ACT nasal spray Place 2 sprays into both nostrils daily.    [provider]  guaiFENesin-dextromethorphan (ROBITUSSIN DM) 100-10 MG/5ML syrup Take 5 mLs by mouth every 4 (four) hours as needed for cough. 06/03/21   Danford, Suann Larry, MD  insulin aspart (NOVOLOG) 100 UNIT/ML injection Inject 0-15 Units into the skin every 4 (four) hours. 06/03/21   Danford, Suann Larry, MD  loperamide (IMODIUM) 2 MG capsule Take 1 capsule (2 mg total) by mouth every 8 (eight) hours as needed for diarrhea or loose stools. Patient not taking: Reported on 06/03/2021 02/14/20   Loletha Grayer, MD  miconazole (MICOTIN) 2 % cream Apply 1 application topically at bedtime.    [provider]  nystatin (MYCOSTATIN/NYSTOP) powder Apply 1 application topically 2 (two)  times daily. 03/13/21   [provider]  oxyCODONE (OXY IR/ROXICODONE) 5 MG immediate release tablet Take 1 tablet (5 mg total) by mouth every 3 (three) hours as needed for severe pain. 06/03/21   Danford, Suann Larry, MD  potassium chloride SA (KLOR-CON) 20 MEQ tablet Take 1 tablet (20 mEq total) by mouth 2 (two) times daily. 06/03/21   Danford, Suann Larry, MD  sodium chloride (OCEAN) 0.65 % nasal spray Place 1 spray into the nose 2 (two) times daily as needed for congestion.    [provider]  trolamine salicylate (ASPERCREME/ALOE) 10 % cream Apply 1 application  topically every 6 (six) hours as needed for muscle pain.    [provider]  Verlee Monte (EQ HYGIENIC CLEANSING WIPES) PADS Apply 1 application topically 2 (two) times daily. 04/13/21   [provider]  hydrochlorothiazide (MICROZIDE) 12.5 MG capsule Take 12.5 mg by mouth daily. Patient not taking: No sig reported 04/03/21 06/02/21  [provider]  QUEtiapine (SEROQUEL) 25 MG tablet Take 0.5 tablets (12.5 mg total) by mouth at bedtime as needed (insomnia). Patient not taking: No sig reported 02/14/20 06/02/21  Loletha Grayer, MD     Objective    Physical Exam: Vitals:   06/03/21 2113  BP: (!) 163/77  Pulse: (!) 114  Resp: 20  Temp: 98.8 F (37.1 C)  TempSrc: Oral  SpO2: 95%  Weight: 111.6 kg  Height: 5\' 4"  (1.626 m)    General: appears to be stated age; alert, oriented Skin: warm, dry, no rash Head:  AT/[place] Mouth:  Oral mucosa membranes appear moist, normal dentition Neck: supple; trachea midline Heart:  RRR; did not appreciate any M/R/G Lungs: CTAB, did not appreciate any wheezes, rales, or rhonchi Abdomen: + BS; soft, ND, NT Vascular: 2+ pedal pulses b/l; 2+ radial pulses b/l Extremities: no peripheral edema, no muscle wasting Neuro: strength and sensation intact in upper and lower extremities b/l    Labs on Admission: I have personally reviewed following labs and imaging studies  CBC: Recent Labs  Lab 06/01/21 0806 06/02/21 0541 06/03/21 0614  WBC 25.3* 31.6* 23.8*  NEUTROABS 21.4*  --   --   HGB 10.2* 11.2* 10.1*  HCT 31.8* 34.9* 31.7*  MCV 75.4* 73.9* 75.7*  PLT 378 384 914   Basic Metabolic Panel: Recent Labs  Lab 06/01/21 0806 06/01/21 1001 06/02/21 0541 06/03/21 0614  NA 133*  --  136 138  K 3.0*  --  2.8* 3.4*  CL 98  --  103 105  CO2 21*  --  23 25  GLUCOSE 185*  --  148* 146*  BUN 20  --  12 22  CREATININE 1.20*  --  0.80 0.88  CALCIUM 8.1*  --  7.9* 7.7*  MG  --  1.6*  --   --    GFR: Estimated Creatinine  Clearance: 66.5 mL/min (by C-G formula based on SCr of 0.88 mg/dL). Liver Function Tests: Recent Labs  Lab 06/01/21 0806 06/02/21 0541  AST 47* 85*  ALT 25 32  ALKPHOS 160* 179*  BILITOT 1.3* 1.6*  PROT 7.0 6.8  ALBUMIN 3.0* 2.6*   No results for input(s): LIPASE, AMYLASE in the last 168 hours. No results for input(s): AMMONIA in the last 168 hours. Coagulation Profile: No results for input(s): INR, PROTIME in the last 168 hours. Cardiac Enzymes: Recent Labs  Lab 06/01/21 0806  CKTOTAL 935*   BNP (last 3 results) No results for input(s): PROBNP in the last 8760  hours. HbA1C: Recent Labs    06/01/21 0806  HGBA1C 7.5*   CBG: Recent Labs  Lab 06/02/21 2354 06/03/21 0405 06/03/21 0817 06/03/21 1109 06/03/21 1617  GLUCAP 173* 164* 142* 216* 205*   Lipid Profile: No results for input(s): CHOL, HDL, LDLCALC, TRIG, CHOLHDL, LDLDIRECT in the last 72 hours. Thyroid Function Tests: No results for input(s): TSH, T4TOTAL, FREET4, T3FREE, THYROIDAB in the last 72 hours. Anemia Panel: No results for input(s): VITAMINB12, FOLATE, FERRITIN, TIBC, IRON, RETICCTPCT in the last 72 hours. Urine analysis:    Component Value Date/Time   COLORURINE YELLOW (A) 06/01/2021 0803   APPEARANCEUR HAZY (A) 06/01/2021 0803   LABSPEC 1.011 06/01/2021 0803   PHURINE 5.0 06/01/2021 0803   GLUCOSEU NEGATIVE 06/01/2021 0803   HGBUR LARGE (A) 06/01/2021 0803   BILIRUBINUR NEGATIVE 06/01/2021 0803   KETONESUR NEGATIVE 06/01/2021 0803   PROTEINUR 30 (A) 06/01/2021 0803   NITRITE NEGATIVE 06/01/2021 0803   LEUKOCYTESUR MODERATE (A) 06/01/2021 0803    Radiological Exams on Admission: US PELVIS (TRANSABDOMINAL ONLY)  Addendum Date: 06/02/2021   ADDENDUM REPORT: 06/02/2021 12:21 ADDENDUM: These results were called by telephone at the time of interpretation on 06/02/2021 at 12:21 pm to provider [facility] , who verbally acknowledged these results. Electronically Signed   By: Marin Olp M.D.    On: 06/02/2021 12:21   Result Date: 06/02/2021 CLINICAL DATA:  Possible uterine mass. EXAM: TRANSABDOMINAL ULTRASOUND OF PELVIS TECHNIQUE: Transabdominal ultrasound examination of the pelvis was performed including evaluation of the uterus, ovaries, adnexal regions, and pelvic cul-de-sac. COMPARISON:  CT 06/01/2021 FINDINGS: Uterus Measurements: 17.9 x 8.7 x 12.0 cm = volume: 975 mL. Examination demonstrates similar findings to that seen on recent CT scan with moderate amount of air throughout the central visualized portion of the uterus obscuring the endometrium and adjacent parametrial tissue/myometrium. Findings are compatible with gas-forming infection. Endometrium Not visualized as obscured due to moderate amounts of air as described. Right ovary Not visualized. Left ovary Not visualized. Other findings:  No abnormal free fluid. IMPRESSION: Moderately enlarged uterus containing moderate amount of air obscuring the endometrium and adjacent myometrium compatible gas-forming infection as shown on recent CT. Ovaries not visualized. Electronically Signed: By: Marin Olp M.D. On: 06/02/2021 12:07      Assessment/Plan   Alexis Mclaughlin is a 75 y.o. female with medical history significant for type 2 diabetes mellitus, hypertension, hypothyroidism, chronic anemia with baseline hemoglobin 10-12, hyperlipidemia, who is admitted to [facility] on 06/03/2021 by way of transfer from [facility] for further evaluation of uterine abscess after presenting from home to the facility on 06/01/2021 complaining of pelvic pain.    Principal Problem:   Uterine abscess Active Problems:   Type 2 diabetes mellitus with chronic kidney disease on chronic dialysis, without long-term current use of insulin (HCC)   Hypokalemia   Clostridium perfringens infection   Bacteremia   Generalized weakness   Hypertension   Hypomagnesemia   Chronic anemia      #) Uterine abscess: Imaging performed at [facility] on 06/01/2021  demonstrated uterine abscess.  In the setting of no ensuing evidence of of spontaneous drainage of such, and in the setting of no interval clinical improvement, as evidenced by persistent tachycardia despite interval IV antibiotics consistent with ID consult recommendations, the patient has been transferred to [facility] today for formal, onsite Gyn-Onc consult per input from Hospitalist's discussions with Dr. Theora Gianotti of Gyn-Onc at [facility] and Dr. Berline Lopes of Gyn-Onc through [facility], as further  detailed above.  We will continue Unasyn per recommendations from infectious disease consultation during [facility] hospital course. As noted above, transfering Hospitalist recommends calling Gyn-Onc on the morning of 06/04/21 to initiate process of formal onsite Gyn-Onc consultation.  Plan: Continue Unasyn.  Pharmacy has been consulted for assistance with dosing of this antibiotic.  Check INR.  Stat repeat CBC upon arrival.  Check CBC with differential in the morning.  Monitor on telemetry.  Gyn-Onc consult tomorrow, as above as needed acetaminophen for pain.  CMP in the morning.      #) Clostridium perfringens bacteremia: Identified on blood cultures collected on day of admission at [facility] on 06/01/2021.  Concern for underlying malignancy in the context of the patient's age.  Recommendations for Unasyn on the basis of infectious disease consultation that occurred during [name] hospital course, as further detailed above.  Plan: Continue Unasyn, as above.  Repeat blood cultures.  Repeat CBC with differential.  Monitor on telemetry given persistent tachycardia throughout [facility] course.        #) Generalized weakness: Generalized weakness leading up to her hospitalization at [facility], in the absence of any acute focal neurologic deficits.  Suspect that her is multifactorial in etiology, with contribution stemming from underlying infection in the form of confirmed Clostridium perfringens bacteremia as well as uterine  abscess in addition to potential of underlying malignancy given these clinical findings in the context of the patient's age.   Plan: Further evaluation management of uterine abscess as well as Clostridium perfringens bacteremia, as further detailed above, with plan for contacting Gyn-Onc on the morning of 06/04/21, as further detailed above.  Physical therapy consult.       #) Hypomagnesemia: Noted during hospital course at [facility], with reported ensuing supplementation.  On arrival at [place] [facility] this evening, I ordered a series of stat labs to establish interval clinical data points, including the of current serum magnesium level.  Plan: Follow for result of serum magnesium on labs ordered to be collected this evening.  Monitor on telemetry.  Repeat serum magnesium level in the morning.     #) Hypokalemia: Documentation of hypokalemia.  Hospital course at [facility], with reported ensuing supplementation efforts.   Plan: Stat repeat BMP at this time.  Monitor on telemetry.  Follow for result of serum magnesium level.  Repeat CMP in the morning.       #) Chronic anemia: Per chart review, it appears the patient has a baseline hemoglobin of 10-12, with most recent hemoglobin 10.1 when checked on the morning of 06/03/2021.  Small amount of vaginal bleeding during hospital course at [facility].  Otherwise, no reported evidence of active bleed.   Plan: Check INR.  Repeat CBC. Gyn-Onc consult, as above. Will follow for results of iron studies initiated while at [facility].       #) Type 2 diabetes mellitus: Documented history of such.  Does not appear to be on any oral hypoglycemic agents or insulin as an outpatient per review of most recent medication list.  Most recent blood sugar BMP performed this morning at [facility] was noted to be 146.  Plan: Accu-Cheks before every meal and at bedtime with low-dose sliding scale insulin.       #) Essential hypertension: Outpatient antihypertensive regimen consists  of Norvasc and Toprol-XL.  Blood pressure upon arrival at [place] [facility] this evening noted to be 163/77, with heart rate 114, consistent with mild tachycardia exhibited throughout hospital course at [facility].  In the setting of underlying infectious process,  including bacteremia, as identified above, will continue to hold home Norvasc, but will resume home beta-blocker to account for any component of rebound tachycardia in the absence of AV nodal blocking agent.   Plan: Hold home Norvasc, as above.  Resume home Toprol-XL.  Close monitoring of ensuing blood pressure via routine vital signs.  Monitor on telemetry.        #) Acquired hypothyroidism: Documented history of such on Synthroid as an outpatient.  Plan: Continue home Synthroid.      #) Hyperlipidemia: On high intensity atorvastatin as an outpatient.  Plan: Continue home statin.      #) GERD: On omeprazole as an outpatient.  Plan: Continue home PPI.       DVT prophylaxis: SCDs Code Status: DNR Family Communication: none Disposition Plan: Per Rounding Team Admission status: Inpatient PCU     Of note, this patient was added by me to the following Admit List/Treatment Team: [email or web address].      PLEASE NOTE THAT DRAGON DICTATION SOFTWARE WAS USED IN THE CONSTRUCTION OF THIS NOTE.   [facility] Pager [phone] From [place]  Otherwise, please contact night-coverage  [email or web address] Password [facility]   06/03/2021, 10:34 PM

## 2021-06-03 NOTE — Progress Notes (Signed)
Benign Gynecology Progress Note  Admission Date: 06/01/2021 Current Date: 06/03/2021  Alexis Mclaughlin is a 75 y.o. HD#2   History complicated by: Patient Active Problem List   Diagnosis Date Noted   Hyponatremia 06/01/2021   Hypokalemia 06/01/2021   Chronic kidney disease 06/01/2021   Elevated AST (SGOT) 06/01/2021   Hypocalcemia 06/01/2021   Uterine abscess 06/01/2021   Sepsis ([place]) 06/01/2021   Rhabdomyolysis 06/01/2021   Frequent falls 06/01/2021   Diarrhea    Acute kidney injury ([place]) 02/10/2020   AKI (acute kidney injury) ([place]) 02/09/2020   Hypotension    Acute cystitis without hematuria    Chronic right shoulder pain    Hypothyroidism    Weakness    Type 2 diabetes mellitus with chronic kidney disease on chronic dialysis, without long-term current use of insulin (HCC)    ROS and patient/family/surgical history, located on admission H&P note dated 06/01/2021, have been reviewed, and there are no changes except as noted below  Yesterday/Overnight Events:  Ultrasound IV ABX Plan for transfer but no beds at [place]:  Pt denies pain. She has had some bleeding; no further passage of clot or debris  Objective:   Temp:  [97.9 F (36.6 C)-98.6 F (37 C)] 97.9 F (36.6 C) (06/12 0815) Pulse Rate:  [103-126] 103 (06/12 0815) Resp:  [16-20] 16 (06/12 0815) BP: (131-152)/(64-80) 134/77 (06/12 0815) SpO2:  [97 %-98 %] 98 % (06/12 0815) I/O last 3 completed shifts: In: 1971.2 [P.O.:60; I.V.:1395.1; IV Piggyback:516.1] Out: 100 [Urine:100] No intake/output data recorded.  Intake/Output Summary (Last 24 hours) at 06/03/2021 1111 Last data filed at 06/03/2021 1031 Gross per 24 hour  Intake 1711.15 ml  Output 100 ml  Net 1611.15 ml   Physical exam: General appearance: alert, cooperative, and appears stated age Abdomen: soft, non-tender; bowel sounds normal; no masses, no organomegaly GU: No gross VB Lungs: clear to auscultation bilaterally Heart:  regular rate and rhythm and no MRGs Extremities: no redness or tenderness in the calves or thighs, no edema Skin: no lesions Psych: appropriate  Recent Labs  Lab 06/01/21 0806 06/02/21 0541 06/03/21 0614  NA 133* 136 138  K 3.0* 2.8* 3.4*  CL 98 103 105  CO2 21* 23 25  BUN 20 12 22   CREATININE 1.20* 0.80 0.88  GLUCOSE 185* 148* 146*   Recent Labs  Lab 06/01/21 0806 06/02/21 0541 06/03/21 0614  WBC 25.3* 31.6* 23.8*  HGB 10.2* 11.2* 10.1*  HCT 31.8* 34.9* 31.7*  PLT 378 384 379    Recent Labs  Lab 06/01/21 0806 06/01/21 1001 06/02/21 0541 06/03/21 0614  CALCIUM 8.1*  --  7.9* 7.7*  MG  --  1.6*  --   --    No results for input(s): INR, APTT in the last 168 hours.    Recent Labs  Lab 06/01/21 0806 06/02/21 0541  ALKPHOS 160* 179*  BILITOT 1.3* 1.6*  PROT 7.0 6.8  ALT 25 32  AST 47* 85*    Recent Labs  Lab 06/01/21 0806 06/02/21 0541 06/03/21 0614  WBC 25.3* 31.6* 23.8*  HGB 10.2* 11.2* 10.1*  HCT 31.8* 34.9* 31.7*  PLT 378 384 379   Recent Labs  Lab 06/01/21 0806 06/02/21 0541 06/03/21 0614  NA 133* 136 138  K 3.0* 2.8* 3.4*  CL 98 103 105  CO2 21* 23 25  BUN 20 12 22   CREATININE 1.20* 0.80 0.88  CALCIUM 8.1* 7.9* 7.7*  PROT 7.0 6.8  --   BILITOT  1.3* 1.6*  --   ALKPHOS 160* 179*  --   ALT 25 32  --   AST 47* 85*  --   GLUCOSE 185* 148* 146*    Radiology See [place] report    Confirmatory for gas/air in uterus, c/w infection  Assessment & Plan:  Continue plan for infection- drainage and IV ABX As she is clinically stable and still draining (vag), I do not recommend D&C today.  Risk of surgery outweigh benefits.  There may be a cause for this if she deteriorates or based on Gyn Onc assessment for further eval and treat.  Barnett Applebaum, MD, [facility], [facility] 06/03/2021  11:16 AM

## 2021-06-03 NOTE — Discharge Summary (Signed)
Physician Discharge Summary  Cherity Blickenstaff MCN:[id number] DOB: July 13, 1946 DOA: 06/01/2021  PCP: Patient, No Pcp Per (Inactive)  Admit date: 06/01/2021 Discharge date: 06/03/2021  Admitted From: Home  Disposition:  [facility]      Discharge Condition: Good  CODE STATUS: DNR Diet recommendation: Diabetic  Brief/Interim Summary: Mrs. Onofre is a 75 y.o. F with obesity, DM, HTN, and hypothyroidism who presented with pelvic discomfort and vaginal discharge.   Evidently the patient developed some symptoms in the last several days, that she thought were a UTI (history is vague but it seems it was vaginal discharge and pelvic discomfort and blood in underwear).  She had only completed 1 dose of Macrobid when she fell and was too weak to get up and was brought to the ER.     In the ER, she had fever, tachycardia, and a CT of the abdomen pelvis showed emphysematous uterine abscess.  The case was discussed with gynecology Oncology at [facility] Dr. Theora Gianotti and [facility] Dr. Berline Lopes both of whom recommended treatment with IV antibiotics and transfer if not improving for evaluation by [facility].         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  Case was discussed with Gyn Onc at [place] as well as Infectious Disease with consensus that patient should have antibiotics and allow the uterine abscess to drain, either spontaneously or via dilation or via Interventional Radiology percutaneous drain.  On 6/11, she passed a large (10x20cm roughly) organized mass from the vagina, which was thought to be infectious material, sent for pathology.  There was hope this implied the abscess would drain spontaneously, however, follow up US showed no clear change in size of abscess and the patient remained  tachycardic to 110s and 120s and with WBC >20K.    Blood cultures obtained on admission grew Clostridium perfringens. This was discussed with ID and antibiotics were narrowed to Unasyn.    Patient was mentating well, but tachycardic, and so transfer to [place] were requested, no beds available at [place], family live in [place], requested transfer to [facility].           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 - Check iron studies   CKD ruled out Baseline creatinine is 0.9   Obesity BMI 42          Discharge Instructions   Allergies as of 06/03/2021       Reactions   Ace Inhibitors         Medication List     STOP taking these medications    amLODipine 10 MG tablet Commonly known as: NORVASC   midodrine 5 MG tablet Commonly known as: PROAMATINE   multivitamin with minerals Tabs tablet   Toprol XL 25 MG 24 hr tablet Generic drug: metoprolol succinate       TAKE these medications    acetaminophen 325 MG tablet Commonly known as: TYLENOL Take 2 tablets (650 mg total) by mouth every 6 (six) hours as needed  for headache, fever or mild pain.   Ampicillin-Sulbactam 3 g in sodium chloride 0.9 % 100 mL Inject 3 g into the vein every 6 (six) hours.   Aspercreme/Aloe 10 % cream Generic drug: trolamine salicylate Apply 1 application topically every 6 (six) hours as needed for muscle pain.   aspirin EC 81 MG tablet Take 81 mg by mouth daily.   cholecalciferol 25 MCG (1000 UNIT)  tablet Commonly known as: VITAMIN D3 Take 1,000 Units by mouth daily.   diclofenac Sodium 1 % Gel Commonly known as: VOLTAREN Apply to right shoulder and neck three times a day   docusate sodium 100 MG capsule Commonly known as: COLACE Take 100 mg by mouth daily.   DULoxetine 60 MG capsule Commonly known as: CYMBALTA Take 60 mg by mouth daily.   EQ Hygienic Cleansing Wipes Pads Apply 1 application topically 2 (two) times daily.   fluticasone 50 MCG/ACT nasal spray Commonly known as: FLONASE Place 2 sprays into both nostrils daily.   GNP Iron 200 (65 Fe) MG Tabs Generic drug: Ferrous Sulfate Dried Take 1 tablet by mouth daily.   guaiFENesin-dextromethorphan 100-10 MG/5ML syrup Commonly known as: ROBITUSSIN DM Take 5 mLs by mouth every 4 (four) hours as needed for cough.   insulin aspart 100 UNIT/ML injection Commonly known as: novoLOG Inject 0-15 Units into the skin every 4 (four) hours.   levothyroxine 50 MCG tablet Commonly known as: SYNTHROID Take 50 mcg by mouth daily before breakfast.   lidocaine 5 % Commonly known as: LIDODERM Place 1 patch onto the skin daily. Remove & Discard patch within 12 hours or as directed by MD   Lipitor 80 MG tablet Generic drug: atorvastatin Take 1 tablet by mouth daily.   loperamide 2 MG capsule Commonly known as: IMODIUM Take 1 capsule (2 mg total) by mouth every 8 (eight) hours as needed for diarrhea or loose stools.   miconazole 2 % cream Commonly known as: MICOTIN Apply 1 application topically at bedtime.   nystatin powder Commonly known as: MYCOSTATIN/NYSTOP Apply 1 application topically 2 (two) times daily.   omeprazole 20 MG capsule Commonly known as: PRILOSEC Take 20 mg by mouth daily.   oxyCODONE 5 MG immediate release tablet Commonly known as: Oxy IR/ROXICODONE Take 1 tablet (5 mg total) by mouth every 3 (three) hours as needed for severe pain.   potassium chloride SA 20 MEQ tablet Commonly known as:  KLOR-CON Take 1 tablet (20 mEq total) by mouth 2 (two) times daily.   Sarna lotion Generic drug: camphor-menthol Apply 1 application topically 3 (three) times daily as needed for itching.   sodium chloride 0.65 % nasal spray Commonly known as: OCEAN Place 1 spray into the nose 2 (two) times daily as needed for congestion.        Allergies  Allergen Reactions   Ace Inhibitors     Consultations: Obstetrics   Procedures/Studies: DG Chest 1 View  Result Date: 06/01/2021 CLINICAL DATA:  Increasing hypoxia.  Ex-smoker. EXAM: CHEST  1 VIEW COMPARISON:  06/01/2021 FINDINGS: Poor inspiration, decreased. Stable mildly enlarged cardiac silhouette and that stable mildly enlarged cardiac silhouette. Mild increase in prominence of the pulmonary vasculature and interstitial markings due to the poor inspiration. No pleural fluid. Thoracic spine degenerative changes. Bilateral shoulder degenerative changes. IMPRESSION: No acute abnormality. Stable mild cardiomegaly and mild interstitial lung disease. Electronically Signed   By: Claudie Revering M.D.   On: 06/01/2021 14:55   CT Head Wo Contrast  Result Date: 06/01/2021  CLINICAL DATA:  Trauma.  Head trauma EXAM: CT HEAD WITHOUT CONTRAST CT CERVICAL SPINE WITHOUT CONTRAST TECHNIQUE: Multidetector CT imaging of the head and cervical spine was performed following the standard protocol without intravenous contrast. Multiplanar CT image reconstructions of the cervical spine were also generated. COMPARISON:  None. FINDINGS: CT HEAD FINDINGS Brain: No evidence of acute infarction, hemorrhage, hydrocephalus,or mass lesion/mass effect. Vascular: Negative Skull: Negative for fracture Sinuses/Orbits: No acute finding. CT CERVICAL SPINE FINDINGS Alignment: Reversal of cervical lordosis.  No traumatic malalignment Skull base and vertebrae: No evidence of acute fracture. There is generalized motion artifact. Soft tissues and spinal canal: No prevertebral fluid or  swelling. No visible canal hematoma. Disc levels:  Ordinary degenerative changes for age Upper chest: No visible injury IMPRESSION: 1. Negative for intracranial hemorrhage. 2. Motion degraded cervical spine CT without fracture. Electronically Signed   By: Monte Fantasia M.D.   On: 06/01/2021 08:52   CT Cervical Spine Wo Contrast  Result Date: 06/01/2021 CLINICAL DATA:  Trauma.  Head trauma EXAM: CT HEAD WITHOUT CONTRAST CT CERVICAL SPINE WITHOUT CONTRAST TECHNIQUE: Multidetector CT imaging of the head and cervical spine was performed following the standard protocol without intravenous contrast. Multiplanar CT image reconstructions of the cervical spine were also generated. COMPARISON:  None. FINDINGS: CT HEAD FINDINGS Brain: No evidence of acute infarction, hemorrhage, hydrocephalus,or mass lesion/mass effect. Vascular: Negative Skull: Negative for fracture Sinuses/Orbits: No acute finding. CT CERVICAL SPINE FINDINGS Alignment: Reversal of cervical lordosis.  No traumatic malalignment Skull base and vertebrae: No evidence of acute fracture. There is generalized motion artifact. Soft tissues and spinal canal: No prevertebral fluid or swelling. No visible canal hematoma. Disc levels:  Ordinary degenerative changes for age Upper chest: No visible injury IMPRESSION: 1. Negative for intracranial hemorrhage. 2. Motion degraded cervical spine CT without fracture. Electronically Signed   By: Monte Fantasia M.D.   On: 06/01/2021 08:52   US PELVIS (TRANSABDOMINAL ONLY)  Addendum Date: 06/02/2021   ADDENDUM REPORT: 06/02/2021 12:21 ADDENDUM: These results were called by telephone at the time of interpretation on 06/02/2021 at 12:21 pm to provider [facility] , who verbally acknowledged these results. Electronically Signed   By: Marin Olp M.D.   On: 06/02/2021 12:21   Result Date: 06/02/2021 CLINICAL DATA:  Possible uterine mass. EXAM: TRANSABDOMINAL ULTRASOUND OF PELVIS TECHNIQUE: Transabdominal ultrasound  examination of the pelvis was performed including evaluation of the uterus, ovaries, adnexal regions, and pelvic cul-de-sac. COMPARISON:  CT 06/01/2021 FINDINGS: Uterus Measurements: 17.9 x 8.7 x 12.0 cm = volume: 975 mL. Examination demonstrates similar findings to that seen on recent CT scan with moderate amount of air throughout the central visualized portion of the uterus obscuring the endometrium and adjacent parametrial tissue/myometrium. Findings are compatible with gas-forming infection. Endometrium Not visualized as obscured due to moderate amounts of air as described. Right ovary Not visualized. Left ovary Not visualized. Other findings:  No abnormal free fluid. IMPRESSION: Moderately enlarged uterus containing moderate amount of air obscuring the endometrium and adjacent myometrium compatible gas-forming infection as shown on recent CT. Ovaries not visualized. Electronically Signed: By: Marin Olp M.D. On: 06/02/2021 12:07   DG Chest Portable 1 View  Result Date: 06/01/2021 CLINICAL DATA:  Pain following fall EXAM: PORTABLE CHEST 1 VIEW COMPARISON:  None. FINDINGS: There is elevation of the right hemidiaphragm. There is no edema or airspace opacity. Heart is mildly enlarged with pulmonary vascularity normal. No adenopathy. No bone lesions. IMPRESSION: Elevation of right hemidiaphragm. No edema or airspace  opacity. Mild cardiac enlargement. Electronically Signed   By: Lowella Grip III M.D.   On: 06/01/2021 08:36   CT Renal Stone Study  Result Date: 06/01/2021 CLINICAL DATA:  White pain EXAM: CT ABDOMEN AND PELVIS WITHOUT CONTRAST TECHNIQUE: Multidetector CT imaging of the abdomen and pelvis was performed following the standard protocol without oral or IV contrast. COMPARISON:  None. FINDINGS: Lower chest: There is mild bibasilar atelectasis. No lung base edema or consolidation. Foci of coronary artery calcification noted. There is a fairly small hiatal hernia. Hepatobiliary: No focal liver  lesions are appreciable on this noncontrast enhanced study. There is cholelithiasis. Questionable mild gallbladder wall thickening. No pericholecystic fluid by CT. No biliary duct dilatation. Pancreas: No pancreatic mass or inflammatory focus. Spleen: No splenic lesions are evident. Adrenals/Urinary Tract: Mild adrenal hypertrophy on each side. No well-defined renal mass. There is an apparent cyst in the lateral mid right kidney measuring 1.5 x 1.5 cm. There is an extrarenal pelvis on the right, an anatomic variant. There is mild fullness of the right renal collecting system. No similar fullness on the left. There is no evident intrarenal calculus. There is prominence of the right ureter. There is apparent impression in the mid right ureter due to markedly enlarged uterus with suspected infection in the mid abdomen and pelvis extending toward the right. No ureterectasis on the left. No ureteral calculus on either side. Urinary bladder is midline with wall thickness within normal limits. Stomach/Bowel: There is no appreciable bowel wall or mesenteric thickening. No evident bowel obstruction. Terminal ileum appears normal. Appendix appears normal. There is no appreciable free air or portal venous air. Vascular/Lymphatic: There is no abdominal aortic aneurysm. There are occasional foci aortic atherosclerosis. There is no appreciable adenopathy in the abdomen pelvis. Reproductive: The uterus is anteverted. The uterus is diffusely enlarged, measuring 22.1 x 12.5 x 12.2 cm. There is extensive fluid and air throughout the uterus with an air-fluid level consistent with intrauterine abscess/emphysematous pyometritis. Note that there are foci of air within the wall of this enlarged uterus. There is no adnexal region mass. Other: No ascites in the abdomen or pelvis. No abscess apart from the changes within the uterus noted. Musculoskeletal: There are no blastic or lytic bone lesions. Spinal stenosis is noted L3-4 and L4-5 due  to disc protrusion and bony hypertrophy. No blastic or lytic bone lesions are evident. No abdominal wall or intramuscular lesions are evident. IMPRESSION: 1. Markedly enlarged uterus with extensive fluid and air within this enlarged uterus consistent with intrauterine abscess. Scattered foci of air elsewhere throughout the uterus and in the uterine wall. 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 Gynecologic assessment urged in this regard. 2. Fullness of the right renal collecting system and proximal to mid ureter likely due to compression of the right ureter by a markedly enlarged and apparently infected uterus. No renal or ureteral calculi on either side. Urinary bladder wall thickness normal. 3.  Cholelithiasis.  Suspect mild gallbladder wall thickening. 4. No bowel obstruction. No abscess apart from the changes in the uterus. Appendix appears normal. 5.  Small hiatal hernia. 6. Spinal stenosis L3-4 and L4-5 due to disc protrusion and bony hypertrophy. 7. Aortic Atherosclerosis (ICD10-I70.0). Foci of coronary artery calcification also noted. 8. There is a degree of adrenal hypertrophy bilaterally, a finding of uncertain significance. Electronically Signed   By: Lowella Grip III M.D.   On: 06/01/2021 09:00      Subjective: No confusion, no fever.  She complains of some left shoulder pain, some left leg pain.  Mild vaginal bleeding, mild purulent material in purewick.  Discharge Exam: Vitals:   06/03/21 1113 06/03/21 1620  BP: (!) 158/73 (!) 142/70  Pulse: (!) 108 100  Resp: 17 16  Temp: 98.3 F (36.8 C) 98.2 F (36.8 C)  SpO2: 98% 96%   Vitals:   06/03/21 0330 06/03/21 0815 06/03/21 1113 06/03/21 1620  BP: 135/72 134/77 (!) 158/73 (!) 142/70  Pulse: (!) 115 (!) 103 (!) 108 100  Resp: 18 16 17 16   Temp: 98.2 F (36.8 C) 97.9 F (36.6 C) 98.3 F (36.8 C) 98.2 F (36.8 C)  TempSrc:   Oral   SpO2: 97% 98% 98% 96%  Weight:      Height:         General: Pt is alert, awake, not in acute distress Cardiovascular: Tachy, nl S1-S2, no murmurs appreciated.   No LE edema.   Respiratory: Normal respiratory rate and rhythm.  CTAB without rales or wheezes. Abdominal: Abdomen soft and non-tender.  No distension or HSM.   Neuro/Psych: Strength symmetric in upper and lower extremities.  Judgment and insight appear mildly impaired by dementia.   The results of significant diagnostics from this hospitalization (including imaging, microbiology, ancillary and laboratory) are listed below for reference.     Microbiology: Recent Results (from the past 240 hour(s))  Blood culture (routine x 2)     Status: Abnormal (Preliminary result)   Collection Time: 06/01/21  8:06 AM   Specimen: BLOOD  Result Value Ref Range Status   Specimen Description   Final    BLOOD RIGHT ANTECUBITAL Performed at [facility], [address] [place], [place] [address]    Special Requests   Final    BOTTLES DRAWN AEROBIC AND ANAEROBIC Blood Culture adequate volume Performed at [facility], [address], [place], [place] [address]    Culture  Setup Time   Final    GRAM POSITIVE RODS ANAEROBIC BOTTLE ONLY CRITICAL RESULT CALLED TO, READ BACK BY AND VERIFIED WITH: KISHAN PATEL @1610  ON 06.10.22.SH    Culture (A)  Final    CLOSTRIDIUM PERFRINGENS Standardized susceptibility testing for this organism is not available. Performed at [facility], [place] [address], [place], [place] [address]    Report Status PENDING  Incomplete  Blood culture (routine x 2)     Status: Abnormal (Preliminary result)   Collection Time: 06/01/21  8:06 AM   Specimen: BLOOD RIGHT HAND  Result Value Ref Range Status   Specimen Description   Final    BLOOD RIGHT HAND Performed at [facility], [address], [place], [place] [address]    Special Requests   Final    BOTTLES DRAWN AEROBIC AND ANAEROBIC Blood Culture results may not be optimal  due to an inadequate volume of blood received in culture bottles Performed at [facility], [address] [place], [place] [address]    Culture  Setup Time ANAEROBIC BOTTLE ONLY GRAM POSITIVE RODS   Final   Culture (A)  Final    CLOSTRIDIUM PERFRINGENS Standardized susceptibility testing for this organism is not available. Performed at [facility], [place] [address] [place], [place] [address]    Report Status PENDING  Incomplete  Resp Panel by RT-PCR (Flu A&B, Covid) Nasopharyngeal Swab     Status: None   Collection Time: 06/01/21  8:06 AM   Specimen: Nasopharyngeal Swab; Nasopharyngeal(NP) swabs in vial transport  medium  Result Value Ref Range Status   SARS Coronavirus 2 by RT PCR NEGATIVE NEGATIVE Final    Comment: (NOTE) SARS-CoV-2 target nucleic acids are NOT DETECTED.  The SARS-CoV-2 RNA is generally detectable in upper respiratory specimens during the acute phase of infection. The lowest concentration of SARS-CoV-2 viral copies this assay can detect is 138 copies/mL. A negative result does not preclude SARS-Cov-2 infection and should not be used as the sole basis for treatment or other patient management decisions. A negative result may occur with  improper specimen collection/handling, submission of specimen other than nasopharyngeal swab, presence of viral mutation(s) within the areas targeted by this assay, and inadequate number of viral copies(<138 copies/mL). A negative result must be combined with clinical observations, patient history, and epidemiological information. The expected result is Negative.  Fact Sheet for Patients:  EntrepreneurPulse.com.au  Fact Sheet for Healthcare Providers:  IncredibleEmployment.be  This test is no t yet approved or cleared by the [place] FDA and  has been authorized for detection and/or diagnosis of SARS-CoV-2 by FDA under an Emergency Use Authorization (EUA). This EUA will  remain  in effect (meaning this test can be used) for the duration of the COVID-19 declaration under Section 564(b)(1) of the Act, 21 U.S.C.section 360bbb-3(b)(1), unless the authorization is terminated  or revoked sooner.       Influenza A by PCR NEGATIVE NEGATIVE Final   Influenza B by PCR NEGATIVE NEGATIVE Final    Comment: (NOTE) The Xpert Xpress SARS-CoV-2/FLU/RSV plus assay is intended as an aid in the diagnosis of influenza from Nasopharyngeal swab specimens and should not be used as a sole basis for treatment. Nasal washings and aspirates are unacceptable for Xpert Xpress SARS-CoV-2/FLU/RSV testing.  Fact Sheet for Patients: EntrepreneurPulse.com.au  Fact Sheet for Healthcare Providers: IncredibleEmployment.be  This test is not yet approved or cleared by the [place] FDA and has been authorized for detection and/or diagnosis of SARS-CoV-2 by FDA under an Emergency Use Authorization (EUA). This EUA will remain in effect (meaning this test can be used) for the duration of the COVID-19 declaration under Section 564(b)(1) of the Act, 21 U.S.C. section 360bbb-3(b)(1), unless the authorization is terminated or revoked.  Performed at [facility], [address] [place], [place] [address]      Labs: BNP (last 3 results) No results for input(s): BNP in the last 8760 hours. Basic Metabolic Panel: Recent Labs  Lab 06/01/21 0806 06/01/21 1001 06/02/21 0541 06/03/21 0614  NA 133*  --  136 138  K 3.0*  --  2.8* 3.4*  CL 98  --  103 105  CO2 21*  --  23 25  GLUCOSE 185*  --  148* 146*  BUN 20  --  12 22  CREATININE 1.20*  --  0.80 0.88  CALCIUM 8.1*  --  7.9* 7.7*  MG  --  1.6*  --   --    Liver Function Tests: Recent Labs  Lab 06/01/21 0806 06/02/21 0541  AST 47* 85*  ALT 25 32  ALKPHOS 160* 179*  BILITOT 1.3* 1.6*  PROT 7.0 6.8  ALBUMIN 3.0* 2.6*   No results for input(s): LIPASE, AMYLASE in the last  168 hours. No results for input(s): AMMONIA in the last 168 hours. CBC: Recent Labs  Lab 06/01/21 0806 06/02/21 0541 06/03/21 0614  WBC 25.3* 31.6* 23.8*  NEUTROABS 21.4*  --   --   HGB 10.2* 11.2* 10.1*  HCT 31.8* 34.9* 31.7*  MCV 75.4*  73.9* 75.7*  PLT 378 384 379   Cardiac Enzymes: Recent Labs  Lab 06/01/21 0806  CKTOTAL 935*   BNP: Invalid input(s): POCBNP CBG: Recent Labs  Lab 06/02/21 2354 06/03/21 0405 06/03/21 0817 06/03/21 1109 06/03/21 1617  GLUCAP 173* 164* 142* 216* 205*   D-Dimer No results for input(s): DDIMER in the last 72 hours. Hgb A1c Recent Labs    06/01/21 0806  HGBA1C 7.5*   Lipid Profile No results for input(s): CHOL, HDL, LDLCALC, TRIG, CHOLHDL, LDLDIRECT in the last 72 hours. Thyroid function studies No results for input(s): TSH, T4TOTAL, T3FREE, THYROIDAB in the last 72 hours.  Invalid input(s): FREET3 Anemia work up No results for input(s): VITAMINB12, FOLATE, FERRITIN, TIBC, IRON, RETICCTPCT in the last 72 hours. Urinalysis    Component Value Date/Time   COLORURINE YELLOW (A) 06/01/2021 0803   APPEARANCEUR HAZY (A) 06/01/2021 0803   LABSPEC 1.011 06/01/2021 0803   PHURINE 5.0 06/01/2021 0803   GLUCOSEU NEGATIVE 06/01/2021 0803   HGBUR LARGE (A) 06/01/2021 0803   BILIRUBINUR NEGATIVE 06/01/2021 0803   KETONESUR NEGATIVE 06/01/2021 0803   PROTEINUR 30 (A) 06/01/2021 0803   NITRITE NEGATIVE 06/01/2021 0803   LEUKOCYTESUR MODERATE (A) 06/01/2021 0803   Sepsis Labs Invalid input(s): PROCALCITONIN,  WBC,  LACTICIDVEN Microbiology Recent Results (from the past 240 hour(s))  Blood culture (routine x 2)     Status: Abnormal (Preliminary result)   Collection Time: 06/01/21  8:06 AM   Specimen: BLOOD  Result Value Ref Range Status   Specimen Description   Final    BLOOD RIGHT ANTECUBITAL Performed at [facility], [address] [place], [place] [address]    Special Requests   Final    BOTTLES DRAWN AEROBIC AND  ANAEROBIC Blood Culture adequate volume Performed at [facility], [address] [place], [place] [address]    Culture  Setup Time   Final    GRAM POSITIVE RODS ANAEROBIC BOTTLE ONLY CRITICAL RESULT CALLED TO, READ BACK BY AND VERIFIED WITH: KISHAN PATEL @1610  ON 06.10.22.SH    Culture (A)  Final    CLOSTRIDIUM PERFRINGENS Standardized susceptibility testing for this organism is not available. Performed at [facility], [place] [address] [place], [place] [address]    Report Status PENDING  Incomplete  Blood culture (routine x 2)     Status: Abnormal (Preliminary result)   Collection Time: 06/01/21  8:06 AM   Specimen: BLOOD RIGHT HAND  Result Value Ref Range Status   Specimen Description   Final    BLOOD RIGHT HAND Performed at [facility], [address] [place], [place] [address]    Special Requests   Final    BOTTLES DRAWN AEROBIC AND ANAEROBIC Blood Culture results may not be optimal due to an inadequate volume of blood received in culture bottles Performed at [facility], [address], [place], [place] [address]    Culture  Setup Time ANAEROBIC BOTTLE ONLY GRAM POSITIVE RODS   Final   Culture (A)  Final    CLOSTRIDIUM PERFRINGENS Standardized susceptibility testing for this organism is not available. Performed at [facility], [place] [address], [place],  [address]    Report Status PENDING  Incomplete  Resp Panel by RT-PCR (Flu A&B, Covid) Nasopharyngeal Swab     Status: None   Collection Time: 06/01/21  8:06 AM   Specimen: Nasopharyngeal Swab; Nasopharyngeal(NP) swabs in vial transport medium  Result Value Ref Range Status   SARS Coronavirus  2 by RT PCR NEGATIVE NEGATIVE Final    Comment: (NOTE) SARS-CoV-2 target nucleic acids are NOT DETECTED.  The SARS-CoV-2 RNA is generally detectable in upper respiratory specimens during the acute phase of infection. The lowest concentration of SARS-CoV-2 viral copies this  assay can detect is 138 copies/mL. A negative result does not preclude SARS-Cov-2 infection and should not be used as the sole basis for treatment or other patient management decisions. A negative result may occur with  improper specimen collection/handling, submission of specimen other than nasopharyngeal swab, presence of viral mutation(s) within the areas targeted by this assay, and inadequate number of viral copies(<138 copies/mL). A negative result must be combined with clinical observations, patient history, and epidemiological information. The expected result is Negative.  Fact Sheet for Patients:  EntrepreneurPulse.com.au  Fact Sheet for Healthcare Providers:  IncredibleEmployment.be  This test is no t yet approved or cleared by the [place] FDA and  has been authorized for detection and/or diagnosis of SARS-CoV-2 by FDA under an Emergency Use Authorization (EUA). This EUA will remain  in effect (meaning this test can be used) for the duration of the COVID-19 declaration under Section 564(b)(1) of the Act, 21 U.S.C.section 360bbb-3(b)(1), unless the authorization is terminated  or revoked sooner.       Influenza A by PCR NEGATIVE NEGATIVE Final   Influenza B by PCR NEGATIVE NEGATIVE Final    Comment: (NOTE) The Xpert Xpress SARS-CoV-2/FLU/RSV plus assay is intended as an aid in the diagnosis of influenza from Nasopharyngeal swab specimens and should not be used as a sole basis for treatment. Nasal washings and aspirates are unacceptable for Xpert Xpress SARS-CoV-2/FLU/RSV testing.  Fact Sheet for Patients: EntrepreneurPulse.com.au  Fact Sheet for Healthcare Providers: IncredibleEmployment.be  This test is not yet approved or cleared by the [place] FDA and has been authorized for detection and/or diagnosis of SARS-CoV-2 by FDA under an Emergency Use Authorization (EUA). This EUA will  remain in effect (meaning this test can be used) for the duration of the COVID-19 declaration under Section 564(b)(1) of the Act, 21 U.S.C. section 360bbb-3(b)(1), unless the authorization is terminated or revoked.  Performed at [facility], [address] [place], [place] [address]      Time coordinating discharge: 35 minutes      30 Day Unplanned Readmission Risk Score    Flowsheet Row ED to Hosp-Admission (Current) from 06/01/2021 in [place] (1C)  30 Day Unplanned Readmission Risk Score (%) 14.67 Filed at 06/03/2021 1600       This score is the patient's risk of an unplanned readmission within 30 days of being discharged (0 -100%). The score is based on dignosis, age, lab data, medications, orders, and past utilization.   Low:  0-14.9   Medium: 15-21.9   High: 22-29.9   Extreme: 30 and above             SIGNED:   Edwin Dada, MD  [facility] 06/03/2021, 7:00 PM

## 2021-06-04 ENCOUNTER — Telehealth: Payer: Self-pay | Admitting: Oncology

## 2021-06-04 ENCOUNTER — Encounter: Payer: Self-pay | Admitting: Gynecologic Oncology

## 2021-06-04 DIAGNOSIS — R531 Weakness: Secondary | ICD-10-CM

## 2021-06-04 DIAGNOSIS — D649 Anemia, unspecified: Secondary | ICD-10-CM | POA: Diagnosis present

## 2021-06-04 DIAGNOSIS — R7881 Bacteremia: Secondary | ICD-10-CM | POA: Diagnosis present

## 2021-06-04 DIAGNOSIS — I10 Essential (primary) hypertension: Secondary | ICD-10-CM | POA: Diagnosis present

## 2021-06-04 DIAGNOSIS — B967 Clostridium perfringens [C. perfringens] as the cause of diseases classified elsewhere: Secondary | ICD-10-CM | POA: Diagnosis present

## 2021-06-04 DIAGNOSIS — N858 Other specified noninflammatory disorders of uterus: Secondary | ICD-10-CM

## 2021-06-04 LAB — GLUCOSE, CAPILLARY
Glucose-Capillary: 175 mg/dL — ABNORMAL HIGH (ref 70–99)
Glucose-Capillary: 183 mg/dL — ABNORMAL HIGH (ref 70–99)
Glucose-Capillary: 161 mg/dL — ABNORMAL HIGH (ref 70–99)
Glucose-Capillary: 160 mg/dL — ABNORMAL HIGH (ref 70–99)

## 2021-06-04 LAB — CBC WITH DIFFERENTIAL/PLATELET
Abs Immature Granulocytes: 0.34 10*3/uL — ABNORMAL HIGH (ref 0.00–0.07)
Basophils Absolute: 0.1 10*3/uL (ref 0.0–0.1)
Basophils Relative: 0 %
Eosinophils Absolute: 0.9 10*3/uL — ABNORMAL HIGH (ref 0.0–0.5)
Eosinophils Relative: 5 %
HCT: 31.1 % — ABNORMAL LOW (ref 36.0–46.0)
Hemoglobin: 9.6 g/dL — ABNORMAL LOW (ref 12.0–15.0)
Immature Granulocytes: 2 %
Lymphocytes Relative: 12 %
Lymphs Abs: 2 10*3/uL (ref 0.7–4.0)
MCH: 23.4 pg — ABNORMAL LOW (ref 26.0–34.0)
MCHC: 30.9 g/dL (ref 30.0–36.0)
MCV: 75.9 fL — ABNORMAL LOW (ref 80.0–100.0)
Monocytes Absolute: 1.7 10*3/uL — ABNORMAL HIGH (ref 0.1–1.0)
Monocytes Relative: 10 %
Neutro Abs: 11.7 10*3/uL — ABNORMAL HIGH (ref 1.7–7.7)
Neutrophils Relative %: 71 %
Platelets: 410 10*3/uL — ABNORMAL HIGH (ref 150–400)
RBC: 4.1 MIL/uL (ref 3.87–5.11)
RDW: 15.4 % (ref 11.5–15.5)
WBC: 16.7 10*3/uL — ABNORMAL HIGH (ref 4.0–10.5)
nRBC: 0 % (ref 0.0–0.2)

## 2021-06-04 LAB — CULTURE, BLOOD (ROUTINE X 2): Special Requests: ADEQUATE

## 2021-06-04 LAB — COMPREHENSIVE METABOLIC PANEL
ALT: 32 U/L (ref 0–44)
AST: 51 U/L — ABNORMAL HIGH (ref 15–41)
Albumin: 2.3 g/dL — ABNORMAL LOW (ref 3.5–5.0)
Alkaline Phosphatase: 159 U/L — ABNORMAL HIGH (ref 38–126)
Anion gap: 8 (ref 5–15)
BUN: 18 mg/dL (ref 8–23)
CO2: 24 mmol/L (ref 22–32)
Calcium: 7.6 mg/dL — ABNORMAL LOW (ref 8.9–10.3)
Chloride: 104 mmol/L (ref 98–111)
Creatinine, Ser: 0.94 mg/dL (ref 0.44–1.00)
GFR, Estimated: >60 mL/min (ref >60)
Glucose, Bld: 138 mg/dL — ABNORMAL HIGH (ref 70–99)
Potassium: 3.5 mmol/L (ref 3.5–5.1)
Sodium: 136 mmol/L (ref 135–145)
Total Bilirubin: 0.8 mg/dL (ref 0.3–1.2)
Total Protein: 6.3 g/dL — ABNORMAL LOW (ref 6.5–8.1)

## 2021-06-04 LAB — CBC
HCT: 29.4 % — ABNORMAL LOW (ref 36.0–46.0)
Hemoglobin: 9.3 g/dL — ABNORMAL LOW (ref 12.0–15.0)
MCH: 23.8 pg — ABNORMAL LOW (ref 26.0–34.0)
MCHC: 31.6 g/dL (ref 30.0–36.0)
MCV: 75.4 fL — ABNORMAL LOW (ref 80.0–100.0)
Platelets: 426 10*3/uL — ABNORMAL HIGH (ref 150–400)
RBC: 3.9 MIL/uL (ref 3.87–5.11)
RDW: 15.5 % (ref 11.5–15.5)
WBC: 18.6 10*3/uL — ABNORMAL HIGH (ref 4.0–10.5)
nRBC: 0 % (ref 0.0–0.2)

## 2021-06-04 LAB — BASIC METABOLIC PANEL
Anion gap: 10 (ref 5–15)
BUN: 18 mg/dL (ref 8–23)
CO2: 23 mmol/L (ref 22–32)
Calcium: 7.6 mg/dL — ABNORMAL LOW (ref 8.9–10.3)
Chloride: 101 mmol/L (ref 98–111)
Creatinine, Ser: 1 mg/dL (ref 0.44–1.00)
GFR, Estimated: 58 mL/min — ABNORMAL LOW (ref >60)
Glucose, Bld: 145 mg/dL — ABNORMAL HIGH (ref 70–99)
Potassium: 3.5 mmol/L (ref 3.5–5.1)
Sodium: 134 mmol/L — ABNORMAL LOW (ref 135–145)

## 2021-06-04 LAB — PROTIME-INR
INR: 1.2 (ref 0.8–1.2)
Prothrombin Time: 15.1 seconds (ref 11.4–15.2)

## 2021-06-04 LAB — MAGNESIUM
Magnesium: 1.9 mg/dL (ref 1.7–2.4)
Magnesium: 1.8 mg/dL (ref 1.7–2.4)

## 2021-06-04 MED ORDER — CHLORHEXIDINE GLUCONATE CLOTH 2 % EX PADS
6.0000 | MEDICATED_PAD | Freq: Every day | CUTANEOUS | Status: DC
  Administered 2021-06-05: 6 via TOPICAL

## 2021-06-04 MED ORDER — CEFAZOLIN SODIUM-DEXTROSE 2-4 GM/100ML-% IV SOLN
2.0000 g | INTRAVENOUS | Status: AC
  Administered 2021-06-05: 2 g via INTRAVENOUS
  Filled 2021-06-04 (×2): qty 100

## 2021-06-04 MED ORDER — HEPARIN SODIUM (PORCINE) 5000 UNIT/ML IJ SOLN
5000.0000 [IU] | INTRAMUSCULAR | Status: AC
  Administered 2021-06-05: 5000 [IU] via SUBCUTANEOUS
  Filled 2021-06-04: qty 1

## 2021-06-04 NOTE — H&P (View-Only) (Signed)
GYN Oncology Consultation  Alexis Mclaughlin 75 y.o. female  HPI: Alexis Mclaughlin is a 75 year old female with a medical history of dementia, diabetes, hypothyroidism, arthritis, and HTN who presented to [facility] ED via ambulance on June 01, 2021 after having a fall at home. She had started treatment with Macrobid for a UTI and had taken one to two doses prior to the fall. Upon arrival to the ED, she was noted to be tachycardic. CT imaging was performed due to complaints of back pain and the uncertainty of a head injury during the fall. CT head/C spine were negative for intracranial hemorrhage, without fracture. CT renal study revealed a markedly enlarged uterus with extensive fluid and air consistent with emphysematous pyometritis with intrauterine abscess, compression of the right ureter, cholelithiasis.   WBC count on admission was 25.3, Hgb 10.2, Hct 31.8, K+3.0, Creatinine 1.20, total CK 935, lactic acid 3.7, blood cultures growing clostridium perfringens. Code sepsis was initiated and she was started on IV antibiotics including Flagyl, Vancomycin, and Cefepime. On 06/02/2021, the patient passed a large piece of tissue vaginally and this was sent for pathology (still pending). She was transferred from [place] to [facility] for GYN Oncology consultation. Her WBC count has trended downward with the IV antibiotics with most recent on 06/04/21 at 16.7.   Additional past medical history provided by patient and daughter today: Patient is accompanied by her daughter, Alexis Mclaughlin.  Most of the history is provided by her daughter.  Her daughter reports that the patient was in her usual state of health until approximately March.  At that time, the patient began having vulvar/vaginal pruritus and they noted blood in the toilet when she urinated.  This is the first menopausal bleeding that she has had.  She was given a prescription for Monistat by her primary care provider to treat a suspected yeast  infection.  An appointment was made in July for a urologist given hematuria.  The daughter also notes over the last several months that there was a foul smell when her mother urinated.   A little over a year ago, the patient was admitted to the hospital for treatment of a complicated UTI.  On Thursday this past week, she started treatment for a urinary tract infection in the outpatient setting.  When her daughter visited her on Thursday, she found her mother on the floor.  Friday morning, the patient was not answering her phone so Alexis Mclaughlin sent her daughter to check on the patient.  She found the patient again on the floor of her apartment.  Her granddaughter took her into the emergency department where she was admitted for further evaluation.   Gynecologic history is notable for menopause in her late 57s.  The patient as well as her daughter are unsure when her last Pap was.  The patient thinks maybe she has a remote history of an abnormal Pap smear that did not require treatment.   Per her daughter, the patient lives in [place] by herself in a senior assisted apartment.  There is a CNA that comes in twice a week to help and Alexis Mclaughlin goes once a week to do meal prep and cleaning.  Patient denies any alcohol use.  She has a prior history of tobacco use but quit more than 30 years ago.    Her past surgical history includes tubal ligation and breast biopsy. No family history of malignancy reported. PCP: Dionicia Abler, NP in [place]   Interval History:  Today, the  patient denies any abdominal or pelvic pain.  She has pain in her legs related to arthritis and being in bed.  She endorses a normal appetite.  She had some nausea and one episode of emesis last Thursday around the time she took her antibiotics.  Otherwise she denies any nausea or emesis.  Daughter denies any recent weight changes or changes in energy.  Patient reports normalbowel movements, she uses a stool softener and Dulcolax.   Review  of Systems Could not be obtained secondary to patient's dementia. Modified ROS provided above from the patient's daughter.  Current Meds: Current medications reviewed.  Allergy:  Allergies  Allergen Reactions   Ace Inhibitors     Social Hx:   Social History   Socioeconomic History   Marital status: Married    Spouse name: Not on file   Number of children: Not on file   Years of education: Not on file   Highest education level: Not on file  Occupational History   Not on file  Tobacco Use   Smoking status: Former    Pack years: 0.00    Types: Cigarettes   Smokeless tobacco: Never   Tobacco comments:    Quit at least 30 years ago (prior to 1990)  Vaping Use   Vaping Use: Never used  Substance and Sexual Activity   Alcohol use: Not Currently   Drug use: Never   Sexual activity: Not Currently  Other Topics Concern   Not on file  Social History Narrative   Not on file   Social Determinants of Health   Financial Resource Strain: Not on file  Food Insecurity: Not on file  Transportation Needs: Not on file  Physical Activity: Not on file  Stress: Not on file  Social Connections: Not on file  Intimate Partner Violence: Not on file    Past Surgical Hx:  Past Surgical History:  Procedure Laterality Date   BREAST BIOPSY Right 09/02/2018   affirm stereo/path pending   TUBAL LIGATION      Past Medical Hx:  Past Medical History:  Diagnosis Date   Arthritis    Constipation    Dementia ([place])    Diabetes mellitus without complication ([place])    GERD (gastroesophageal reflux disease)    Hypertension    Hypothyroidism     Family Hx:  Family History  Problem Relation Age of Onset   CAD Mother    CAD Father    Cancer Neg Hx     Vitals:  Blood pressure (!) 145/72, pulse (!) 109, temperature 98 F (36.7 C), temperature source Oral, resp. rate 18, height 5\' 4"  (1.626 m), weight 248 lb 3.8 oz (112.6 kg), SpO2 99 %.  Physical Exam: Performed by Dr.  Berline Lopes  Assessment/Plan: Surgery has been scheduled for tomorrow am. Surgery will include a dilation and curettage of the uterus under laparoscopic guidance, robotic assisted total hysterectomy, bilateral salpingo-oophorectomy, possible mini laparotomy, possible staging with Dr. Jeral Pinch at [place], NP 06/04/2021, 12:23 PM

## 2021-06-04 NOTE — Consult Note (Signed)
[facility] for Infectious Disease  Total days of antibiotics 4/day 2 amp/sub               Reason for Consult:uterine abscess    Referring Physician: Dwyane Dee  Principal Problem:   Uterine abscess Active Problems:   Type 2 diabetes mellitus with chronic kidney disease on chronic dialysis, without long-term current use of insulin (HCC)   Hypokalemia   Clostridium perfringens infection   Bacteremia   Generalized weakness   Hypertension   Hypomagnesemia   Chronic anemia    HPI: Alexis Mclaughlin is a 75 y.o. female with T2DM, dementia, HTN, admitted for AMS where she had recently been treated for UTI. She was noted to have marked leukocytosis, Lactic acidosis, and imaging showed enlarged uterus with fluid and gas/ concerning for utrauterine infection. She was started on broad spectrum abtx. Her infectious work up revealed clostridium perfringes bacteremia. On vaginal exam by gyn team there is necrotic white tissue protruding from cervic, and some fould smelling discharge in vaginal vault. 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 With clostridial disease. Gyn surgeyr will try to do D x C under laparoscopic guidance then proceed with robotic hysterectomy with BSO. Patient is poor historian to describe onset of symptoms. She appears to be clinically stable and leukocytosis is improving while on abtx down to 16.7K from 31.6K. though has increased platelets, and still do not have source control.  Past Medical History:  Diagnosis Date   Arthritis    Constipation    Dementia ([place])    Diabetes mellitus without complication (HCC)    GERD (gastroesophageal reflux disease)    Hypertension    Hypothyroidism     Allergies:  Allergies  Allergen Reactions   Ace Inhibitors     MEDICATIONS:  atorvastatin  80 mg Oral Daily   [START ON 06/05/2021] Chlorhexidine Gluconate Cloth  6 each Topical Q0600   DULoxetine  60 mg Oral Daily   fluticasone  2  spray Each Nare Daily   [START ON 06/05/2021] heparin injection (subcutaneous)  5,000 Units Subcutaneous 120 min pre-op   insulin aspart  0-6 Units Subcutaneous TID WC   levothyroxine  50 mcg Oral QAC breakfast   metoprolol succinate  12.5 mg Oral Daily   pantoprazole  40 mg Oral Daily    Social History   Tobacco Use   Smoking status: Former    Pack years: 0.00    Types: Cigarettes   Smokeless tobacco: Never   Tobacco comments:    Quit at least 30 years ago (prior to 1990)  Vaping Use   Vaping Use: Never used  Substance Use Topics   Alcohol use: Not Currently   Drug use: Never    Family History  Problem Relation Age of Onset   CAD Mother    CAD Father    Cancer Neg Hx     Review of Systems -  Unable to obtain due to dementia. She denies fever, chills, abdominal pain.  OBJECTIVE: Temp:  [98 F (36.7 C)-99.4 F (37.4 C)] 99.4 F (37.4 C) (06/13 1700) Pulse Rate:  [103-118] 104 (06/13 1700) Resp:  [18-20] 20 (06/13 1311) BP: (132-163)/(72-87) 132/79 (06/13 1700) SpO2:  [95 %-99 %] 99 % (06/13 1700) Weight:  [111.6 kg-112.6 kg] 112.6 kg (06/13 0500) Physical Exam  Constitutional:  oriented to person, only. appears well-developed and well-nourished. No distress.  HENT: [place]/AT, PERRLA, no scleral icterus Mouth/Throat: Oropharynx is clear and moist. No  oropharyngeal exudate.  Cardiovascular: Normal rate, regular rhythm and normal heart sounds. Exam reveals no gallop and no friction rub.  No murmur heard.  Pulmonary/Chest: Effort normal and breath sounds normal. No respiratory distress.  has no wheezes.  Neck = supple, no nuchal rigidity Abdominal: Soft. Bowel sounds are normal.  exhibits no distension. There is no tenderness. Foul odor noted Lymphadenopathy: no cervical adenopathy. No axillary adenopathy Skin: Skin is warm and dry. No rash noted. No erythema.  Psychiatric: sleepy, flat affect  LABS: Results for orders placed or performed during the hospital encounter  of 06/03/21 (from the past 48 hour(s))  Basic metabolic panel     Status: Abnormal   Collection Time: 06/03/21 11:15 PM  Result Value Ref Range   Sodium 134 (L) 135 - 145 mmol/L   Potassium 3.5 3.5 - 5.1 mmol/L   Chloride 101 98 - 111 mmol/L   CO2 23 22 - 32 mmol/L   Glucose, Bld 145 (H) 70 - 99 mg/dL    Comment: Glucose reference range applies only to samples taken after fasting for at least 8 hours.   BUN 18 8 - 23 mg/dL   Creatinine, Ser 1.00 0.44 - 1.00 mg/dL   Calcium 7.6 (L) 8.9 - 10.3 mg/dL   GFR, Estimated 58 (L) >60 mL/min    Comment: (NOTE) Calculated using the CKD-EPI Creatinine Equation (2021)    Anion gap 10 5 - 15    Comment: Performed at [facility], [place] [address] [place], [place] [address]  CBC     Status: Abnormal   Collection Time: 06/03/21 11:15 PM  Result Value Ref Range   WBC 18.6 (H) 4.0 - 10.5 K/uL   RBC 3.90 3.87 - 5.11 MIL/uL   Hemoglobin 9.3 (L) 12.0 - 15.0 g/dL   HCT 29.4 (L) 36.0 - 46.0 %   MCV 75.4 (L) 80.0 - 100.0 fL   MCH 23.8 (L) 26.0 - 34.0 pg   MCHC 31.6 30.0 - 36.0 g/dL   RDW 15.5 11.5 - 15.5 %   Platelets 426 (H) 150 - 400 K/uL   nRBC 0.0 0.0 - 0.2 %    Comment: Performed at [facility], [place] [address] [place], [place] [address]  Magnesium     Status: None   Collection Time: 06/03/21 11:15 PM  Result Value Ref Range   Magnesium 1.9 1.7 - 2.4 mg/dL    Comment: Performed at [facility], [place] [address], [place], [place] [address]  Glucose, capillary     Status: Abnormal   Collection Time: 06/03/21 11:20 PM  Result Value Ref Range   Glucose-Capillary 138 (H) 70 - 99 mg/dL    Comment: Glucose reference range applies only to samples taken after fasting for at least 8 hours.  Comprehensive metabolic panel     Status: Abnormal   Collection Time: 06/04/21  3:33 AM  Result Value Ref Range   Sodium 136 135 - 145 mmol/L   Potassium 3.5 3.5 - 5.1 mmol/L   Chloride 104 98 - 111 mmol/L    CO2 24 22 - 32 mmol/L   Glucose, Bld 138 (H) 70 - 99 mg/dL    Comment: Glucose reference range applies only to samples taken after fasting for at least 8 hours.   BUN 18 8 - 23 mg/dL   Creatinine, Ser 0.94 0.44 - 1.00 mg/dL   Calcium 7.6 (L) 8.9 - 10.3 mg/dL   Total Protein 6.3 (L) 6.5 - 8.1 g/dL  Albumin 2.3 (L) 3.5 - 5.0 g/dL   AST 51 (H) 15 - 41 U/L   ALT 32 0 - 44 U/L   Alkaline Phosphatase 159 (H) 38 - 126 U/L   Total Bilirubin 0.8 0.3 - 1.2 mg/dL   GFR, Estimated >60 >60 mL/min    Comment: (NOTE) Calculated using the CKD-EPI Creatinine Equation (2021)    Anion gap 8 5 - 15    Comment: Performed at [facility], [place] [address] [place], [place] [address]  CBC with Differential/Platelet     Status: Abnormal   Collection Time: 06/04/21  3:33 AM  Result Value Ref Range   WBC 16.7 (H) 4.0 - 10.5 K/uL   RBC 4.10 3.87 - 5.11 MIL/uL   Hemoglobin 9.6 (L) 12.0 - 15.0 g/dL   HCT 31.1 (L) 36.0 - 46.0 %   MCV 75.9 (L) 80.0 - 100.0 fL   MCH 23.4 (L) 26.0 - 34.0 pg   MCHC 30.9 30.0 - 36.0 g/dL   RDW 15.4 11.5 - 15.5 %   Platelets 410 (H) 150 - 400 K/uL   nRBC 0.0 0.0 - 0.2 %   Neutrophils Relative % 71 %   Neutro Abs 11.7 (H) 1.7 - 7.7 K/uL   Lymphocytes Relative 12 %   Lymphs Abs 2.0 0.7 - 4.0 K/uL   Monocytes Relative 10 %   Monocytes Absolute 1.7 (H) 0.1 - 1.0 K/uL   Eosinophils Relative 5 %   Eosinophils Absolute 0.9 (H) 0.0 - 0.5 K/uL   Basophils Relative 0 %   Basophils Absolute 0.1 0.0 - 0.1 K/uL   Immature Granulocytes 2 %   Abs Immature Granulocytes 0.34 (H) 0.00 - 0.07 K/uL    Comment: Performed at [facility], [place] [address], [place], [place] [address]  Magnesium     Status: None   Collection Time: 06/04/21  3:33 AM  Result Value Ref Range   Magnesium 1.8 1.7 - 2.4 mg/dL    Comment: Performed at [facility], [place] [address] [place], [place] [address]  Protime-INR     Status: None   Collection Time:  06/04/21  3:33 AM  Result Value Ref Range   Prothrombin Time 15.1 11.4 - 15.2 seconds   INR 1.2 0.8 - 1.2    Comment: (NOTE) INR goal varies based on device and disease states. Performed at [facility], [place] [address], [place],  [address]   Glucose, capillary     Status: Abnormal   Collection Time: 06/04/21  7:28 AM  Result Value Ref Range   Glucose-Capillary 175 (H) 70 - 99 mg/dL    Comment: Glucose reference range applies only to samples taken after fasting for at least 8 hours.  Glucose, capillary     Status: Abnormal   Collection Time: 06/04/21 12:18 PM  Result Value Ref Range   Glucose-Capillary 183 (H) 70 - 99 mg/dL    Comment: Glucose reference range applies only to samples taken after fasting for at least 8 hours.  Glucose, capillary     Status: Abnormal   Collection Time: 06/04/21  4:43 PM  Result Value Ref Range   Glucose-Capillary 161 (H) 70 - 99 mg/dL    Comment: Glucose reference range applies only to samples taken after fasting for at least 8 hours.    MICRO: 6/12 blood cx ngtd 6/10 blood cx c.perfringis IMAGING: No results found.  Assessment/Plan:  75yo F with c.perfringis bacteremia thought to be related to  uterine abscess/ possible carcinoma. - continue with amp/sub - agree with plan for definitive treatment/source control with hysterectomy - will plan to continue on treatment for at least 14 days after source control - repeat blood cx appear clearing -will continue to follow. - in terms of diabetes -recommend to see if her home regimen for diabetes management can be optimized   Renne Platts B. [facility] [phone]

## 2021-06-04 NOTE — Consult Note (Signed)
GYN Oncology Consultation  Alexis Mclaughlin 75 y.o. female  HPI: Alexis Mclaughlin is a 75 year old female with a medical history of dementia, diabetes, hypothyroidism, arthritis, and HTN who presented to [facility] ED via ambulance on June 01, 2021 after having a fall at home. She had started treatment with Macrobid for a UTI and had taken one to two doses prior to the fall. Upon arrival to the ED, she was noted to be tachycardic. CT imaging was performed due to complaints of back pain and the uncertainty of a head injury during the fall. CT head/C spine were negative for intracranial hemorrhage, without fracture. CT renal study revealed a markedly enlarged uterus with extensive fluid and air consistent with emphysematous pyometritis with intrauterine abscess, compression of the right ureter, cholelithiasis.   WBC count on admission was 25.3, Hgb 10.2, Hct 31.8, K+3.0, Creatinine 1.20, total CK 935, lactic acid 3.7, blood cultures growing clostridium perfringens. Code sepsis was initiated and she was started on IV antibiotics including Flagyl, Vancomycin, and Cefepime. On 06/02/2021, the patient passed a large piece of tissue vaginally and this was sent for pathology (still pending). She was transferred from [place] to [facility] for GYN Oncology consultation. Her WBC count has trended downward with the IV antibiotics with most recent on 06/04/21 at 16.7.   Additional past medical history provided by patient and daughter today: Patient is accompanied by her daughter, Alexis Mclaughlin.  Most of the history is provided by her daughter.  Her daughter reports that the patient was in her usual state of health until approximately March.  At that time, the patient began having vulvar/vaginal pruritus and they noted blood in the toilet when she urinated.  This is the first menopausal bleeding that she has had.  She was given a prescription for Monistat by her primary care provider to treat a suspected yeast  infection.  An appointment was made in July for a urologist given hematuria.  The daughter also notes over the last several months that there was a foul smell when her mother urinated.   A little over a year ago, the patient was admitted to the hospital for treatment of a complicated UTI.  On Thursday this past week, she started treatment for a urinary tract infection in the outpatient setting.  When her daughter visited her on Thursday, she found her mother on the floor.  Friday morning, the patient was not answering her phone so Alexis Mclaughlin sent her daughter to check on the patient.  She found the patient again on the floor of her apartment.  Her granddaughter took her into the emergency department where she was admitted for further evaluation.   Gynecologic history is notable for menopause in her late 53s.  The patient as well as her daughter are unsure when her last Pap was.  The patient thinks maybe she has a remote history of an abnormal Pap smear that did not require treatment.   Per her daughter, the patient lives in [place] by herself in a senior assisted apartment.  There is a CNA that comes in twice a week to help and Alexis Mclaughlin goes once a week to do meal prep and cleaning.  Patient denies any alcohol use.  She has a prior history of tobacco use but quit more than 30 years ago.    Her past surgical history includes tubal ligation and breast biopsy. No family history of malignancy reported. PCP: Dionicia Abler, NP in [place]   Interval History:  Today, the  patient denies any abdominal or pelvic pain.  She has pain in her legs related to arthritis and being in bed.  She endorses a normal appetite.  She had some nausea and one episode of emesis last Thursday around the time she took her antibiotics.  Otherwise she denies any nausea or emesis.  Daughter denies any recent weight changes or changes in energy.  Patient reports normalbowel movements, she uses a stool softener and Dulcolax.   Review  of Systems Could not be obtained secondary to patient's dementia. Modified ROS provided above from the patient's daughter.  Current Meds: Current medications reviewed.  Allergy:  Allergies  Allergen Reactions   Ace Inhibitors     Social Hx:   Social History   Socioeconomic History   Marital status: Married    Spouse name: Not on file   Number of children: Not on file   Years of education: Not on file   Highest education level: Not on file  Occupational History   Not on file  Tobacco Use   Smoking status: Former    Pack years: 0.00    Types: Cigarettes   Smokeless tobacco: Never   Tobacco comments:    Quit at least 30 years ago (prior to 1990)  Vaping Use   Vaping Use: Never used  Substance and Sexual Activity   Alcohol use: Not Currently   Drug use: Never   Sexual activity: Not Currently  Other Topics Concern   Not on file  Social History Narrative   Not on file   Social Determinants of Health   Financial Resource Strain: Not on file  Food Insecurity: Not on file  Transportation Needs: Not on file  Physical Activity: Not on file  Stress: Not on file  Social Connections: Not on file  Intimate Partner Violence: Not on file    Past Surgical Hx:  Past Surgical History:  Procedure Laterality Date   BREAST BIOPSY Right 09/02/2018   affirm stereo/path pending   TUBAL LIGATION      Past Medical Hx:  Past Medical History:  Diagnosis Date   Arthritis    Constipation    Dementia ([place])    Diabetes mellitus without complication ([place])    GERD (gastroesophageal reflux disease)    Hypertension    Hypothyroidism     Family Hx:  Family History  Problem Relation Age of Onset   CAD Mother    CAD Father    Cancer Neg Hx     Vitals:  Blood pressure (!) 145/72, pulse (!) 109, temperature 98 F (36.7 C), temperature source Oral, resp. rate 18, height 5\' 4"  (1.626 m), weight 248 lb 3.8 oz (112.6 kg), SpO2 99 %.  Physical Exam: Performed by Dr.  Berline Lopes  Assessment/Plan: Surgery has been scheduled for tomorrow am. Surgery will include a dilation and curettage of the uterus under laparoscopic guidance, robotic assisted total hysterectomy, bilateral salpingo-oophorectomy, possible mini laparotomy, possible staging with Dr. Jeral Pinch at [place], NP 06/04/2021, 12:23 PM

## 2021-06-04 NOTE — Telephone Encounter (Signed)
Left a message for Thayer Headings, CSW at [facility] regarding POA/Advance Directives for patient.  Requested a return call.

## 2021-06-04 NOTE — Progress Notes (Signed)
PROGRESS NOTE    Alexis Mclaughlin  BHA:[id number] DOB: 12-23-1946 DOA: 06/03/2021 PCP: Dionicia Abler, NP   Brief Narrative:  This 75 years old female with PMH significant for type 2 diabetes, hypertension, hypothyroidism, chronic anemia with baseline hemoglobin between 10-12, hyperlipidemia who was transferred from [facility] for the evaluation of uterine abscess. Patient was admitted at [facility] for sepsis secondary to uterine abscess.  Blood cultures positive for Clostridium perfringens.  Hospitalist at [facility] discussed the case with Dr. Berline Lopes of GYN oncology.  Patient transferred to [facility] for further management.  Patient continued on antibiotic for Clostridium perfringens bacteremia.  Patient is scheduled to have D& C, robotic assisted hysterectomy, bilateral salpingo-oophorectomy mini laparotomy lymph node dissection tomorrow.  Assessment & Plan:   Principal Problem:   Uterine abscess Active Problems:   Type 2 diabetes mellitus with chronic kidney disease on chronic dialysis, without long-term current use of insulin (HCC)   Hypokalemia   Clostridium perfringens infection   Bacteremia   Generalized weakness   Hypertension   Hypomagnesemia   Chronic anemia  Sepsis secondary to uterine abscess with Clostridium perfringens bacteremia. Patient presented with pelvic pain, imaging demonstrated uterine abscess. Blood cultures positive for Clostridium perfringens.  Infectious disease consulted at [facility] recommended Unasyn. Case was discussed with GYN oncologist at [facility] and at [facility]. Recommended to transfer the patient to [facility] for further management. Continue Unasyn for now. scheduled to have D& C, robotic assisted hysterectomy, bilateral salpingo-oophorectomy mini laparotomy, lymph node dissection tomorrow.  Clostridium perfringens bacteremia: Continue Unasyn as per ID at [facility] There is a concern for malignancy. Repeat blood cultures.  Generalized weakness: It could  be multifactorial, could be from bacteremia and underlying infection. Continue antibiotics PT/ OT evaluation.  Chronic anemia: Baseline hemoglobin 10-12.  Type 2 diabetes Continue regular insulin sliding scale.  Essential hypertension :  continue home blood pressure medications.  Hypothyroidism:  continue Synthroid  DVT prophylaxis: SCDS Code Status: Full code. Family Communication: No family at bed side. Disposition Plan:   Status is: Inpatient  Remains inpatient appropriate because:Inpatient level of care appropriate due to severity of illness  Dispo: The patient is from: Home              Anticipated d/c is to: Home              Patient currently is not medically stable to d/c.   Difficult to place patient No  Consultants:  GYN oncology  Procedures:  Antimicrobials:   Anti-infectives (From admission, onward)    Start     Dose/Rate Route Frequency Ordered Stop   06/05/21 0600  ceFAZolin (ANCEF) IVPB 2g/100 mL premix        2 g 200 mL/hr over 30 Minutes Intravenous On call to O.R. 06/04/21 1306 06/06/21 0559   06/03/21 2300  Ampicillin-Sulbactam (UNASYN) 3 g in sodium chloride 0.9 % 100 mL IVPB        3 g 200 mL/hr over 30 Minutes Intravenous Every 6 hours 06/03/21 2226         Subjective: Patient was seen and examined at bedside.  Overnight events noted.  She complains of pain in the pelvic area, denies any nausea or vomiting.  Objective: Vitals:   06/04/21 0500 06/04/21 0630 06/04/21 0900 06/04/21 1311  BP:  (!) 149/80 (!) 145/72 136/80  Pulse:  (!) 105 (!) 109 (!) 114  Resp:    20  Temp:  98 F (36.7 C)  98.7 F (37.1 C)  TempSrc:  Oral  Oral  SpO2:  99%  96%  Weight: 112.6 kg     Height:        Intake/Output Summary (Last 24 hours) at 06/04/2021 1429 Last data filed at 06/04/2021 1019 Gross per 24 hour  Intake --  Output 700 ml  Net -700 ml   Filed Weights   06/03/21 2113 06/04/21 0500  Weight: 111.6 kg 112.6 kg     Examination:  General exam: Appears calm and comfortable , appears in a lot of pain. Respiratory system: Clear to auscultation. Respiratory effort normal. Cardiovascular system: S1 & S2 heard, RRR. No JVD, murmurs, rubs, gallops or clicks. No pedal edema. Gastrointestinal system: Abdomen is nondistended, soft and tender+ Bilateral pelvic areas. No organomegaly or masses felt. Normal bowel sounds heard. Central nervous system: Alert and oriented. No focal neurological deficits. Extremities: Symmetric 5 x 5 power. Skin: No rashes, lesions or ulcers Psychiatry: Judgement and insight appear normal. Mood & affect appropriate.     Data Reviewed: I have personally reviewed following labs and imaging studies  CBC: Recent Labs  Lab 06/01/21 0806 06/02/21 0541 06/03/21 0614 06/03/21 2315 06/04/21 0333  WBC 25.3* 31.6* 23.8* 18.6* 16.7*  NEUTROABS 21.4*  --   --   --  11.7*  HGB 10.2* 11.2* 10.1* 9.3* 9.6*  HCT 31.8* 34.9* 31.7* 29.4* 31.1*  MCV 75.4* 73.9* 75.7* 75.4* 75.9*  PLT 378 384 379 426* 989*   Basic Metabolic Panel: Recent Labs  Lab 06/01/21 0806 06/01/21 1001 06/02/21 0541 06/03/21 0614 06/03/21 2315 06/04/21 0333  NA 133*  --  136 138 134* 136  K 3.0*  --  2.8* 3.4* 3.5 3.5  CL 98  --  103 105 101 104  CO2 21*  --  23 25 23 24   GLUCOSE 185*  --  148* 146* 145* 138*  BUN 20  --  12 22 18 18   CREATININE 1.20*  --  0.80 0.88 1.00 0.94  CALCIUM 8.1*  --  7.9* 7.7* 7.6* 7.6*  MG  --  1.6*  --   --  1.9 1.8   GFR: Estimated Creatinine Clearance: 62.6 mL/min (by C-G formula based on SCr of 0.94 mg/dL). Liver Function Tests: Recent Labs  Lab 06/01/21 0806 06/02/21 0541 06/04/21 0333  AST 47* 85* 51*  ALT 25 32 32  ALKPHOS 160* 179* 159*  BILITOT 1.3* 1.6* 0.8  PROT 7.0 6.8 6.3*  ALBUMIN 3.0* 2.6* 2.3*   No results for input(s): LIPASE, AMYLASE in the last 168 hours. No results for input(s): AMMONIA in the last 168 hours. Coagulation Profile: Recent  Labs  Lab 06/04/21 0333  INR 1.2   Cardiac Enzymes: Recent Labs  Lab 06/01/21 0806  CKTOTAL 935*   BNP (last 3 results) No results for input(s): PROBNP in the last 8760 hours. HbA1C: No results for input(s): HGBA1C in the last 72 hours. CBG: Recent Labs  Lab 06/03/21 1109 06/03/21 1617 06/03/21 2320 06/04/21 0728 06/04/21 1218  GLUCAP 216* 205* 138* 175* 183*   Lipid Profile: No results for input(s): CHOL, HDL, LDLCALC, TRIG, CHOLHDL, LDLDIRECT in the last 72 hours. Thyroid Function Tests: No results for input(s): TSH, T4TOTAL, FREET4, T3FREE, THYROIDAB in the last 72 hours. Anemia Panel: No results for input(s): VITAMINB12, FOLATE, FERRITIN, TIBC, IRON, RETICCTPCT in the last 72 hours. Sepsis Labs: Recent Labs  Lab 06/01/21 0806 06/01/21 1006 06/02/21 0541  PROCALCITON 53.76  --  58.78  LATICACIDVEN 3.7* 2.3*  --  Recent Results (from the past 240 hour(s))  Blood culture (routine x 2)     Status: Abnormal   Collection Time: 06/01/21  8:06 AM   Specimen: BLOOD  Result Value Ref Range Status   Specimen Description   Final    BLOOD RIGHT ANTECUBITAL Performed at [facility], [address] [place], [place] [address]    Special Requests   Final    BOTTLES DRAWN AEROBIC AND ANAEROBIC Blood Culture adequate volume Performed at [facility], [place], [place] [address]    Culture  Setup Time   Final    GRAM POSITIVE RODS ANAEROBIC BOTTLE ONLY CRITICAL RESULT CALLED TO, READ BACK BY AND VERIFIED WITH: KISHAN PATEL @1610  ON 06.10.22.SH    Culture (A)  Final    CLOSTRIDIUM PERFRINGENS Standardized susceptibility testing for this organism is not available. Performed at [facility], [place] [address] [place], [place] [address]    Report Status 06/04/2021 FINAL  Final  Blood culture (routine x 2)     Status: Abnormal   Collection Time: 06/01/21  8:06 AM   Specimen: BLOOD RIGHT HAND  Result Value Ref Range Status    Specimen Description   Final    BLOOD RIGHT HAND Performed at [facility], [address] [place], [place] [address]    Special Requests   Final    BOTTLES DRAWN AEROBIC AND ANAEROBIC Blood Culture results may not be optimal due to an inadequate volume of blood received in culture bottles Performed at [facility], [address], [place], [place] [address]    Culture  Setup Time ANAEROBIC BOTTLE ONLY GRAM POSITIVE RODS   Final   Culture (A)  Final    CLOSTRIDIUM PERFRINGENS Standardized susceptibility testing for this organism is not available. Performed at [facility], [place] [address] [place], [place] [address]    Report Status 06/04/2021 FINAL  Final  Resp Panel by RT-PCR (Flu A&B, Covid) Nasopharyngeal Swab     Status: None   Collection Time: 06/01/21  8:06 AM   Specimen: Nasopharyngeal Swab; Nasopharyngeal(NP) swabs in vial transport medium  Result Value Ref Range Status   SARS Coronavirus 2 by RT PCR NEGATIVE NEGATIVE Final    Comment: (NOTE) SARS-CoV-2 target nucleic acids are NOT DETECTED.  The SARS-CoV-2 RNA is generally detectable in upper respiratory specimens during the acute phase of infection. The lowest concentration of SARS-CoV-2 viral copies this assay can detect is 138 copies/mL. A negative result does not preclude SARS-Cov-2 infection and should not be used as the sole basis for treatment or other patient management decisions. A negative result may occur with  improper specimen collection/handling, submission of specimen other than nasopharyngeal swab, presence of viral mutation(s) within the areas targeted by this assay, and inadequate number of viral copies(<138 copies/mL). A negative result must be combined with clinical observations, patient history, and epidemiological information. The expected result is Negative.  Fact Sheet for Patients:  EntrepreneurPulse.com.au  Fact Sheet for Healthcare Providers:   IncredibleEmployment.be  This test is no t yet approved or cleared by the [place] FDA and  has been authorized for detection and/or diagnosis of SARS-CoV-2 by FDA under an Emergency Use Authorization (EUA). This EUA will remain  in effect (meaning this test can be used) for the duration of the COVID-19 declaration under Section 564(b)(1) of the Act, 21 U.S.C.section 360bbb-3(b)(1), unless the authorization is terminated  or revoked sooner.  Influenza A by PCR NEGATIVE NEGATIVE Final   Influenza B by PCR NEGATIVE NEGATIVE Final    Comment: (NOTE) The Xpert Xpress SARS-CoV-2/FLU/RSV plus assay is intended as an aid in the diagnosis of influenza from Nasopharyngeal swab specimens and should not be used as a sole basis for treatment. Nasal washings and aspirates are unacceptable for Xpert Xpress SARS-CoV-2/FLU/RSV testing.  Fact Sheet for Patients: EntrepreneurPulse.com.au  Fact Sheet for Healthcare Providers: IncredibleEmployment.be  This test is not yet approved or cleared by the [place] FDA and has been authorized for detection and/or diagnosis of SARS-CoV-2 by FDA under an Emergency Use Authorization (EUA). This EUA will remain in effect (meaning this test can be used) for the duration of the COVID-19 declaration under Section 564(b)(1) of the Act, 21 U.S.C. section 360bbb-3(b)(1), unless the authorization is terminated or revoked.  Performed at [facility], [address] [place], [place] [address]      Radiology Studies: No results found.   Scheduled Meds:  atorvastatin  80 mg Oral Daily   [START ON 06/05/2021] Chlorhexidine Gluconate Cloth  6 each Topical Q0600   DULoxetine  60 mg Oral Daily   fluticasone  2 spray Each Nare Daily   [START ON 06/05/2021] heparin injection (subcutaneous)  5,000 Units Subcutaneous 120 min pre-op   insulin aspart  0-6 Units Subcutaneous TID WC    levothyroxine  50 mcg Oral QAC breakfast   metoprolol succinate  12.5 mg Oral Daily   pantoprazole  40 mg Oral Daily   Continuous Infusions:  ampicillin-sulbactam (UNASYN) IV 3 g (06/04/21 1112)   [START ON 06/05/2021]  ceFAZolin (ANCEF) IV       LOS: 1 day    Time spent: 35 mins    Sari Cogan, MD [facility]   If 7PM-7AM, please contact night-coverage

## 2021-06-04 NOTE — Progress Notes (Signed)
Pt TX from [place] with a yellow mews. MD aware I will continue to monitor and start yellow mews protocol.

## 2021-06-04 NOTE — Evaluation (Signed)
Physical Therapy Evaluation Patient Details Name: Alexis Mclaughlin MRN: [id number] DOB: 1946-01-02 Today's Date: 06/04/2021   History of Present Illness  75 yo female admitted with uterine abscess, fall, UTI. Hx of obesity, DM, anemia, chronic pain (shoulders). 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  Clinical Impression  On eval, pt required Mod A for bed mobility. Pt declined standing attempt on today 2* pain, fear of falling. Pt with multiple complaints throughout session. She is easily aggravated (history taking, mobility). She complained that nursing would not get her up then complained that I was having her mobilize. Encouraged pt to do as much as she could for herself since she has to do that at home (pt lives alone). Pt will quickly state "I cant do it by myself" (before even attempting to move). She did c/o significant pain during session (knees >shoulders). Asked pt if she informed nursing, MD about pain since she stated "my knees have never hurt like this". She stated she did have a fall and that no imaging was performed on LEs. Encouraged pt to discuss this with RN and MD. Will plan to follow and progress activity as pt will allow. At this time, recommendation is for SNF.     Follow Up Recommendations SNF    Equipment Recommendations  None recommended by PT    Recommendations for Other Services       Precautions / Restrictions Precautions Precautions: Fall Restrictions Weight Bearing Restrictions: No      Mobility  Bed Mobility Overal bed mobility: Needs Assistance Bed Mobility: Supine to Sit;Sit to Supine     Supine to sit: HOB elevated;Mod assist Sit to supine: Mod assist;HOB elevated   General bed mobility comments: Assist for trunk and bil LEs. Increased time. Encouragement for pt to try to do as much as she could on her own. Pt is easily aggravated.    Transfers  Lateral scoot transfer: Mod assist Utilized bedpad to aid with scooting towards HOB.  Encouragement required.                General transfer comment: NT-pt declined to attempt  Ambulation/Gait                Stairs            Wheelchair Mobility    Modified Rankin (Stroke Patients Only)       Balance Overall balance assessment: Needs assistance;History of Falls Sitting-balance support: Feet supported Sitting balance-Leahy Scale: Fair                                       Pertinent Vitals/Pain Pain Assessment: 0-10 Pain Score: 9  Pain Location: shoulder, bil LEs (specially knees) Pain Descriptors / Indicators: Grimacing;Guarding;Moaning Pain Intervention(s): Limited activity within patient's tolerance;Monitored during session;Repositioned    Home Living Family/patient expects to be discharged to:: Private residence Living Arrangements: Alone Available Help at Discharge: Family;Available PRN/intermittently;Personal care attendant (aide 2x/week) Type of Home: Apartment Home Access: Elevator;Level entry     Home Layout: One level Home Equipment: Walker - 2 wheels      Prior Function Level of Independence: Needs assistance   Gait / Transfers Assistance Needed: uses RW.  ADL's / Homemaking Assistance Needed: aide helps with bathing, cleaning. aide/family helps with meals.  Comments: participates in PACE program     Hand Dominance        Extremity/Trunk Assessment   Upper  Extremity Assessment Upper Extremity Assessment: Defer to OT evaluation    Lower Extremity Assessment Lower Extremity Assessment: Generalized weakness;RLE deficits/detail;LLE deficits/detail RLE Deficits / Details: pt c/o pain. was able to bend and extend knee. RLE: Unable to fully assess due to pain LLE Deficits / Details: pt c/o pain. was able to bend and extend knee LLE: Unable to fully assess due to pain       Communication   Communication: No difficulties  Cognition Arousal/Alertness: Awake/alert Behavior During Therapy:  Agitated;Anxious Overall Cognitive Status: Within Functional Limits for tasks assessed                                 General Comments: multiple complaints. easily aggravated.      General Comments      Exercises     Assessment/Plan    PT Assessment Patient needs continued PT services  PT Problem List Decreased strength;Decreased mobility;Decreased range of motion;Decreased activity tolerance;Decreased balance;Decreased knowledge of use of DME;Pain;Obesity       PT Treatment Interventions DME instruction;Balance training;Therapeutic exercise;Gait training;Functional mobility training;Therapeutic activities;Patient/family education    PT Goals (Current goals can be found in the Care Plan section)  Acute Rehab PT Goals Patient Stated Goal: less pain. PT Goal Formulation: With patient Time For Goal Achievement: 06/18/21 Potential to Achieve Goals: Fair    Frequency Min 2X/week   Barriers to discharge        Co-evaluation               AM-PAC PT "6 Clicks" Mobility  Outcome Measure Help needed turning from your back to your side while in a flat bed without using bedrails?: A Lot Help needed moving from lying on your back to sitting on the side of a flat bed without using bedrails?: A Lot Help needed moving to and from a bed to a chair (including a wheelchair)?: Total Help needed standing up from a chair using your arms (e.g., wheelchair or bedside chair)?: Total Help needed to walk in hospital room?: Total Help needed climbing 3-5 steps with a railing? : Total 6 Click Score: 8    End of Session   Activity Tolerance: Patient limited by pain;Patient limited by fatigue Patient left: in bed;with call bell/phone within reach;with bed alarm set   PT Visit Diagnosis: Muscle weakness (generalized) (M62.81);Difficulty in walking, not elsewhere classified (R26.2);History of falling (Z91.81);Pain Pain - part of body: Knee;Shoulder    Time: 1540-0867 PT  Time Calculation (min) (ACUTE ONLY): 26 min   Charges:   PT Evaluation $PT Eval Moderate Complexity: 1 Mod PT Treatments $Therapeutic Activity: 8-22 mins          Doreatha Massed, PT Acute Rehabilitation  Office: [phone] Pager: [phone]

## 2021-06-04 NOTE — Telephone Encounter (Signed)
Called [name] (daughter) and advised her that Dr. Berline Lopes would like to bring Alexis Mclaughlin to the clinic for an exam this morning and would like [name] to attend.  She said she will be here in about an hour.  Advised that we will plan on 11:15.  Also spoke to patient's nurse, Patricia Pesa, RN and advised of exam at the cancer center at 11:15.

## 2021-06-04 NOTE — Anesthesia Preprocedure Evaluation (Addendum)
Anesthesia Evaluation  Patient identified by MRN, date of birth, ID band Patient awake  General Assessment Comment:Oriented to person only  Reviewed: Allergy & Precautions, NPO status , Patient's Chart, lab work & pertinent test results  Airway Mallampati: II  TM Distance: >3 FB Neck ROM: Full    Dental no notable dental hx. (+) Edentulous Upper, Edentulous Lower   Pulmonary former smoker,    Pulmonary exam normal breath sounds clear to auscultation       Cardiovascular hypertension, Pt. on medications Normal cardiovascular exam Rhythm:Regular Rate:Normal  EKG ST R 122   Neuro/Psych  Neuromuscular disease negative psych ROS   GI/Hepatic Neg liver ROS, GERD  ,  Endo/Other  diabetes, Well Controlled, Type 2Hypothyroidism   Renal/GU Lab Results      Component                Value               Date                      CREATININE               0.94                06/04/2021                BUN                      18                  06/04/2021                NA                       136                 06/04/2021                K                        3.5                 06/04/2021                CL                       104                 06/04/2021                CO2                      24                  06/04/2021                Musculoskeletal  (+) Arthritis ,   Abdominal (+) + obese,   Peds  Hematology  (+) anemia , Lab Results      Component                Value               Date                      WBC  16.7 (H)            06/04/2021                HGB                      9.6 (L)             06/04/2021                HCT                      31.1 (L)            06/04/2021                MCV                      75.9 (L)            06/04/2021                PLT                      410 (H)             06/04/2021              Anesthesia Other Findings   Reproductive/Obstetrics                             Anesthesia Physical Anesthesia Plan  ASA: 3  Anesthesia Plan: General   Post-op Pain Management:    Induction: Intravenous  PONV Risk Score and Plan: 4 or greater and Treatment may vary due to age or medical condition and Ondansetron  Airway Management Planned: Oral ETT  Additional Equipment: None  Intra-op Plan:   Post-operative Plan: Possible Post-op intubation/ventilation  Informed Consent: I have reviewed the patients History and Physical, chart, labs and discussed the procedure including the risks, benefits and alternatives for the proposed anesthesia with the patient or authorized representative who has indicated his/her understanding and acceptance.   Patient has DNR.  Discussed DNR with power of attorney and Suspend DNR.   Dental advisory given  Plan Discussed with: CRNA and Anesthesiologist  Anesthesia Plan Comments:        Anesthesia Quick Evaluation

## 2021-06-04 NOTE — Telephone Encounter (Signed)
Called [facility] regarding putting a rush on accession [phone].

## 2021-06-05 ENCOUNTER — Other Ambulatory Visit: Payer: Self-pay

## 2021-06-05 ENCOUNTER — Encounter (HOSPITAL_COMMUNITY)
Admission: AD | Disposition: A | Discharge status: Skilled Nursing Facility | Payer: Self-pay | Source: Other Acute Inpatient Hospital | Attending: Internal Medicine

## 2021-06-05 ENCOUNTER — Inpatient Hospital Stay (HOSPITAL_COMMUNITY): Payer: Medicare (Managed Care) | Admitting: Anesthesiology

## 2021-06-05 ENCOUNTER — Encounter (HOSPITAL_COMMUNITY): Payer: Self-pay | Admitting: Internal Medicine

## 2021-06-05 DIAGNOSIS — N858 Other specified noninflammatory disorders of uterus: Secondary | ICD-10-CM | POA: Diagnosis present

## 2021-06-05 DIAGNOSIS — C55 Malignant neoplasm of uterus, part unspecified: Secondary | ICD-10-CM

## 2021-06-05 HISTORY — PX: ROBOTIC ASSISTED TOTAL HYSTERECTOMY WITH BILATERAL SALPINGO OOPHERECTOMY: SHX6086

## 2021-06-05 HISTORY — PX: CYSTOSCOPY: SHX5120

## 2021-06-05 HISTORY — PX: LAPAROSCOPY: SHX197

## 2021-06-05 HISTORY — PX: DILATION AND CURETTAGE OF UTERUS: SHX78

## 2021-06-05 LAB — TYPE AND SCREEN
ABO/RH(D): O NEG
Antibody Screen: NEGATIVE

## 2021-06-05 LAB — CBC WITH DIFFERENTIAL/PLATELET
Abs Immature Granulocytes: 0.35 10*3/uL — ABNORMAL HIGH (ref 0.00–0.07)
Basophils Absolute: 0.1 10*3/uL (ref 0.0–0.1)
Basophils Relative: 1 %
Eosinophils Absolute: 0.8 10*3/uL — ABNORMAL HIGH (ref 0.0–0.5)
Eosinophils Relative: 6 %
HCT: 30 % — ABNORMAL LOW (ref 36.0–46.0)
Hemoglobin: 9.3 g/dL — ABNORMAL LOW (ref 12.0–15.0)
Immature Granulocytes: 3 %
Lymphocytes Relative: 16 %
Lymphs Abs: 2.1 10*3/uL (ref 0.7–4.0)
MCH: 23.7 pg — ABNORMAL LOW (ref 26.0–34.0)
MCHC: 31 g/dL (ref 30.0–36.0)
MCV: 76.3 fL — ABNORMAL LOW (ref 80.0–100.0)
Monocytes Absolute: 2.1 10*3/uL — ABNORMAL HIGH (ref 0.1–1.0)
Monocytes Relative: 15 %
Neutro Abs: 8.2 10*3/uL — ABNORMAL HIGH (ref 1.7–7.7)
Neutrophils Relative %: 59 %
Platelets: 408 10*3/uL — ABNORMAL HIGH (ref 150–400)
RBC: 3.93 MIL/uL (ref 3.87–5.11)
RDW: 15.4 % (ref 11.5–15.5)
WBC: 13.6 10*3/uL — ABNORMAL HIGH (ref 4.0–10.5)
nRBC: 0.1 % (ref 0.0–0.2)

## 2021-06-05 LAB — COMPREHENSIVE METABOLIC PANEL
ALT: 27 U/L (ref 0–44)
AST: 40 U/L (ref 15–41)
Albumin: 2.3 g/dL — ABNORMAL LOW (ref 3.5–5.0)
Alkaline Phosphatase: 146 U/L — ABNORMAL HIGH (ref 38–126)
Anion gap: 9 (ref 5–15)
BUN: 10 mg/dL (ref 8–23)
CO2: 25 mmol/L (ref 22–32)
Calcium: 7.7 mg/dL — ABNORMAL LOW (ref 8.9–10.3)
Chloride: 102 mmol/L (ref 98–111)
Creatinine, Ser: 0.63 mg/dL (ref 0.44–1.00)
GFR, Estimated: >60 mL/min (ref >60)
Glucose, Bld: 147 mg/dL — ABNORMAL HIGH (ref 70–99)
Potassium: 3.2 mmol/L — ABNORMAL LOW (ref 3.5–5.1)
Sodium: 136 mmol/L (ref 135–145)
Total Bilirubin: 0.5 mg/dL (ref 0.3–1.2)
Total Protein: 6.5 g/dL (ref 6.5–8.1)

## 2021-06-05 LAB — GLUCOSE, CAPILLARY
Glucose-Capillary: 146 mg/dL — ABNORMAL HIGH (ref 70–99)
Glucose-Capillary: 173 mg/dL — ABNORMAL HIGH (ref 70–99)
Glucose-Capillary: 172 mg/dL — ABNORMAL HIGH (ref 70–99)
Glucose-Capillary: 170 mg/dL — ABNORMAL HIGH (ref 70–99)
Glucose-Capillary: 151 mg/dL — ABNORMAL HIGH (ref 70–99)

## 2021-06-05 LAB — CBC
HCT: 36 % (ref 36.0–46.0)
Hemoglobin: 10.8 g/dL — ABNORMAL LOW (ref 12.0–15.0)
MCH: 23.6 pg — ABNORMAL LOW (ref 26.0–34.0)
MCHC: 30 g/dL (ref 30.0–36.0)
MCV: 78.8 fL — ABNORMAL LOW (ref 80.0–100.0)
Platelets: 408 10*3/uL — ABNORMAL HIGH (ref 150–400)
RBC: 4.57 MIL/uL (ref 3.87–5.11)
RDW: 16.1 % — ABNORMAL HIGH (ref 11.5–15.5)
WBC: 19.2 10*3/uL — ABNORMAL HIGH (ref 4.0–10.5)
nRBC: 0 % (ref 0.0–0.2)

## 2021-06-05 LAB — PHOSPHORUS: Phosphorus: 3.3 mg/dL (ref 2.5–4.6)

## 2021-06-05 LAB — ABO/RH: ABO/RH(D): O NEG

## 2021-06-05 LAB — MAGNESIUM: Magnesium: 1.7 mg/dL (ref 1.7–2.4)

## 2021-06-05 SURGERY — DILATION AND CURETTAGE
Anesthesia: General | Site: Bladder

## 2021-06-05 MED ORDER — PROPOFOL 10 MG/ML IV BOLUS
INTRAVENOUS | Status: AC
  Filled 2021-06-05: qty 20

## 2021-06-05 MED ORDER — ONDANSETRON HCL 4 MG/2ML IJ SOLN: 4.0000 mg | Freq: Once | INTRAMUSCULAR | Status: DC | PRN

## 2021-06-05 MED ORDER — LIDOCAINE HCL (PF) 1 % IJ SOLN
INTRAMUSCULAR | Status: AC
  Filled 2021-06-05: qty 30

## 2021-06-05 MED ORDER — ONDANSETRON HCL 4 MG/2ML IJ SOLN
INTRAMUSCULAR | Status: DC | PRN
  Administered 2021-06-05: 4 mg via INTRAVENOUS

## 2021-06-05 MED ORDER — PROPOFOL 10 MG/ML IV BOLUS
INTRAVENOUS | Status: AC
  Filled 2021-06-05: qty 40

## 2021-06-05 MED ORDER — ROCURONIUM BROMIDE 10 MG/ML (PF) SYRINGE
PREFILLED_SYRINGE | INTRAVENOUS | Status: AC
  Filled 2021-06-05: qty 10

## 2021-06-05 MED ORDER — DEXAMETHASONE SODIUM PHOSPHATE 10 MG/ML IJ SOLN
INTRAMUSCULAR | Status: AC
  Filled 2021-06-05: qty 1

## 2021-06-05 MED ORDER — ONDANSETRON HCL 4 MG PO TABS: 4.0000 mg | ORAL_TABLET | Freq: Four times a day (QID) | ORAL | Status: DC | PRN

## 2021-06-05 MED ORDER — DROPERIDOL 2.5 MG/ML IJ SOLN: 0.6250 mg | Freq: Once | INTRAMUSCULAR | Status: DC | PRN

## 2021-06-05 MED ORDER — KCL IN DEXTROSE-NACL 20-5-0.45 MEQ/L-%-% IV SOLN
INTRAVENOUS | Status: DC
Start: 2021-06-05 — End: 2021-06-08
  Filled 2021-06-05 (×3): qty 1000

## 2021-06-05 MED ORDER — ROCURONIUM BROMIDE 10 MG/ML (PF) SYRINGE
PREFILLED_SYRINGE | INTRAVENOUS | Status: DC | PRN
  Administered 2021-06-05 (×3): 30 mg via INTRAVENOUS
  Administered 2021-06-05: 40 mg via INTRAVENOUS

## 2021-06-05 MED ORDER — OXYCODONE HCL 5 MG PO TABS: 5.0000 mg | ORAL_TABLET | Freq: Once | ORAL | Status: DC | PRN

## 2021-06-05 MED ORDER — OXYCODONE HCL 5 MG/5ML PO SOLN: 5.0000 mg | Freq: Once | ORAL | Status: DC | PRN

## 2021-06-05 MED ORDER — HYDROMORPHONE HCL 1 MG/ML IJ SOLN: 0.2500 mg | INTRAMUSCULAR | Status: DC | PRN

## 2021-06-05 MED ORDER — LACTATED RINGERS IV SOLN: INTRAVENOUS | Status: DC | PRN

## 2021-06-05 MED ORDER — LIDOCAINE 2% (20 MG/ML) 5 ML SYRINGE
INTRAMUSCULAR | Status: DC | PRN
  Administered 2021-06-05: 100 mg via INTRAVENOUS

## 2021-06-05 MED ORDER — PHENYLEPHRINE HCL (PRESSORS) 10 MG/ML IV SOLN
INTRAVENOUS | Status: AC
  Filled 2021-06-05: qty 1

## 2021-06-05 MED ORDER — FENTANYL CITRATE (PF) 100 MCG/2ML IJ SOLN
INTRAMUSCULAR | Status: AC
  Filled 2021-06-05: qty 2

## 2021-06-05 MED ORDER — BUPIVACAINE HCL 0.25 % IJ SOLN
INTRAMUSCULAR | Status: AC
  Filled 2021-06-05: qty 1

## 2021-06-05 MED ORDER — POTASSIUM CHLORIDE 10 MEQ/100ML IV SOLN
10.0000 meq | INTRAVENOUS | Status: AC
  Administered 2021-06-05 (×3): 10 meq via INTRAVENOUS
  Filled 2021-06-05 (×3): qty 100

## 2021-06-05 MED ORDER — SILVER NITRATE-POT NITRATE 75-25 % EX MISC
CUTANEOUS | Status: AC
  Filled 2021-06-05: qty 10

## 2021-06-05 MED ORDER — HEPARIN SODIUM (PORCINE) 5000 UNIT/ML IJ SOLN
5000.0000 [IU] | Freq: Three times a day (TID) | INTRAMUSCULAR | Status: DC
  Administered 2021-06-06 – 2021-06-08 (×6): 5000 [IU] via SUBCUTANEOUS
  Filled 2021-06-05 (×6): qty 1

## 2021-06-05 MED ORDER — PROPOFOL 10 MG/ML IV BOLUS
INTRAVENOUS | Status: DC | PRN
  Administered 2021-06-05: 80 mg via INTRAVENOUS
  Administered 2021-06-05: 20 mg via INTRAVENOUS

## 2021-06-05 MED ORDER — TRAMADOL HCL 50 MG PO TABS
50.0000 mg | ORAL_TABLET | Freq: Four times a day (QID) | ORAL | Status: DC | PRN
  Administered 2021-06-07 (×2): 50 mg via ORAL
  Filled 2021-06-05 (×3): qty 1

## 2021-06-05 MED ORDER — ACETAMINOPHEN 10 MG/ML IV SOLN: 1000.0000 mg | Freq: Once | INTRAVENOUS | Status: DC | PRN

## 2021-06-05 MED ORDER — STERILE WATER FOR INJECTION IJ SOLN
INTRAMUSCULAR | Status: AC
  Filled 2021-06-05: qty 10

## 2021-06-05 MED ORDER — ONDANSETRON HCL 4 MG/2ML IJ SOLN
INTRAMUSCULAR | Status: AC
  Filled 2021-06-05: qty 2

## 2021-06-05 MED ORDER — LIDOCAINE HCL (PF) 2 % IJ SOLN
INTRAMUSCULAR | Status: DC | PRN
  Administered 2021-06-05: 1.5 mg/kg/h via INTRADERMAL

## 2021-06-05 MED ORDER — ONDANSETRON HCL 4 MG/2ML IJ SOLN
4.0000 mg | Freq: Four times a day (QID) | INTRAMUSCULAR | Status: DC | PRN
Start: 2021-06-05 — End: 2021-06-08

## 2021-06-05 MED ORDER — CHLORHEXIDINE GLUCONATE CLOTH 2 % EX PADS
6.0000 | MEDICATED_PAD | Freq: Every day | CUTANEOUS | Status: DC
  Administered 2021-06-05 – 2021-06-06 (×2): 6 via TOPICAL

## 2021-06-05 MED ORDER — FENTANYL CITRATE (PF) 100 MCG/2ML IJ SOLN
INTRAMUSCULAR | Status: DC | PRN
  Administered 2021-06-05 (×3): 50 ug via INTRAVENOUS
  Administered 2021-06-05 (×2): 25 ug via INTRAVENOUS

## 2021-06-05 MED ORDER — SUGAMMADEX SODIUM 200 MG/2ML IV SOLN
INTRAVENOUS | Status: DC | PRN
  Administered 2021-06-05: 250 mg via INTRAVENOUS

## 2021-06-05 MED ORDER — OXYCODONE HCL 5 MG PO TABS
5.0000 mg | ORAL_TABLET | ORAL | Status: DC | PRN
  Administered 2021-06-06 – 2021-06-07 (×6): 5 mg via ORAL
  Administered 2021-06-07 (×2): 10 mg via ORAL
  Administered 2021-06-08: 5 mg via ORAL
  Filled 2021-06-05 (×2): qty 1
  Filled 2021-06-05: qty 2
  Filled 2021-06-05 (×5): qty 1
  Filled 2021-06-05: qty 2
  Filled 2021-06-05: qty 1

## 2021-06-05 MED ORDER — BUPIVACAINE HCL 0.25 % IJ SOLN
INTRAMUSCULAR | Status: DC | PRN
  Administered 2021-06-05: 20 mL

## 2021-06-05 SURGICAL SUPPLY — 94 items
APPLICATOR SURGIFLO ENDO (HEMOSTASIS) IMPLANT
BACTOSHIELD CHG 4% 4OZ (MISCELLANEOUS) ×2
BAG LAPAROSCOPIC 12 15 PORT 16 (BASKET) IMPLANT
BAG RETRIEVAL 12/15 (BASKET) ×3
BAG RETRIEVAL 12/15MM (BASKET) ×1
BLADE SURG SZ10 CARB STEEL (BLADE) IMPLANT
CATH ROBINSON RED A/P 16FR (CATHETERS) ×4 IMPLANT
CNTNR URN SCR LID CUP LEK RST (MISCELLANEOUS) IMPLANT
CONT SPEC 4OZ STRL OR WHT (MISCELLANEOUS) ×2
COVER BACK TABLE 60X90IN (DRAPES) ×4 IMPLANT
COVER TIP SHEARS 8 DVNC (MISCELLANEOUS) ×2 IMPLANT
COVER TIP SHEARS 8MM DA VINCI (MISCELLANEOUS) ×2
COVER WAND RF STERILE (DRAPES) ×2 IMPLANT
DECANTER SPIKE VIAL GLASS SM (MISCELLANEOUS) ×2 IMPLANT
DERMABOND ADVANCED (GAUZE/BANDAGES/DRESSINGS) ×2
DERMABOND ADVANCED .7 DNX12 (GAUZE/BANDAGES/DRESSINGS) ×2 IMPLANT
DRAPE ARM DVNC X/XI (DISPOSABLE) ×8 IMPLANT
DRAPE COLUMN DVNC XI (DISPOSABLE) ×2 IMPLANT
DRAPE DA VINCI XI ARM (DISPOSABLE) ×8
DRAPE DA VINCI XI COLUMN (DISPOSABLE) ×2
DRAPE SHEET LG 3/4 BI-LAMINATE (DRAPES) ×4 IMPLANT
DRAPE SURG IRRIG POUCH 19X23 (DRAPES) ×4 IMPLANT
DRAPE UNDERBUTTOCKS STRL (DISPOSABLE) ×4 IMPLANT
DRSG OPSITE POSTOP 4X6 (GAUZE/BANDAGES/DRESSINGS) IMPLANT
DRSG OPSITE POSTOP 4X8 (GAUZE/BANDAGES/DRESSINGS) IMPLANT
DRSG TEGADERM 2-3/8X2-3/4 SM (GAUZE/BANDAGES/DRESSINGS) ×2 IMPLANT
DRSG TELFA 3X8 NADH (GAUZE/BANDAGES/DRESSINGS) ×4 IMPLANT
ELECT PENCIL ROCKER SW 15FT (MISCELLANEOUS) IMPLANT
ELECT REM PT RETURN 15FT ADLT (MISCELLANEOUS) ×4 IMPLANT
EVACUATOR DRAINAGE 7X20 100CC (MISCELLANEOUS) IMPLANT
EVACUATOR SILICONE 100CC (MISCELLANEOUS) ×2
GAUZE 4X4 16PLY RFD (DISPOSABLE) ×6 IMPLANT
GLOVE SURG ENC MOIS LTX SZ6 (GLOVE) ×16 IMPLANT
GLOVE SURG ENC MOIS LTX SZ6.5 (GLOVE) ×8 IMPLANT
GOWN STRL REUS W/ TWL LRG LVL3 (GOWN DISPOSABLE) ×8 IMPLANT
GOWN STRL REUS W/TWL LRG LVL3 (GOWN DISPOSABLE) ×8
HOLDER FOLEY CATH W/STRAP (MISCELLANEOUS) ×4 IMPLANT
IRRIG SUCT STRYKERFLOW 2 WTIP (MISCELLANEOUS) ×4
IRRIGATION SUCT STRKRFLW 2 WTP (MISCELLANEOUS) ×2 IMPLANT
KIT BASIN OR (CUSTOM PROCEDURE TRAY) ×4 IMPLANT
KIT PROCEDURE DA VINCI SI (MISCELLANEOUS)
KIT PROCEDURE DVNC SI (MISCELLANEOUS) IMPLANT
KIT TURNOVER KIT A (KITS) ×4 IMPLANT
MANIPULATOR UTERINE 4.5 ZUMI (MISCELLANEOUS) ×4 IMPLANT
NDL HYPO 21X1.5 SAFETY (NEEDLE) ×2 IMPLANT
NDL SPNL 18GX3.5 QUINCKE PK (NEEDLE) IMPLANT
NDL SPNL 22GX3.5 QUINCKE BK (NEEDLE) ×2 IMPLANT
NEEDLE HYPO 21X1.5 SAFETY (NEEDLE) ×4 IMPLANT
NEEDLE SPNL 18GX3.5 QUINCKE PK (NEEDLE) IMPLANT
NEEDLE SPNL 22GX3.5 QUINCKE BK (NEEDLE) ×4 IMPLANT
NS IRRIG 1000ML POUR BTL (IV SOLUTION) ×4 IMPLANT
OBTURATOR OPTICAL STANDARD 8MM (TROCAR) ×2
OBTURATOR OPTICAL STND 8 DVNC (TROCAR) ×2
OBTURATOR OPTICALSTD 8 DVNC (TROCAR) ×2 IMPLANT
PACK LITHOTOMY IV (CUSTOM PROCEDURE TRAY) ×4 IMPLANT
PACK ROBOT GYN CUSTOM WL (TRAY / TRAY PROCEDURE) ×4 IMPLANT
PAD DRESSING TELFA 3X8 NADH (GAUZE/BANDAGES/DRESSINGS) ×2 IMPLANT
PAD OB MATERNITY 4.3X12.25 (PERSONAL CARE ITEMS) ×4 IMPLANT
PAD POSITIONING PINK XL (MISCELLANEOUS) ×4 IMPLANT
PENCIL SMOKE EVACUATOR (MISCELLANEOUS) IMPLANT
PORT ACCESS TROCAR AIRSEAL 12 (TROCAR) ×2 IMPLANT
PORT ACCESS TROCAR AIRSEAL 5M (TROCAR) ×2
POUCH SPECIMEN RETRIEVAL 10MM (ENDOMECHANICALS) IMPLANT
SCRUB CHG 4% DYNA-HEX 4OZ (MISCELLANEOUS) ×2 IMPLANT
SEAL CANN UNIV 5-8 DVNC XI (MISCELLANEOUS) ×8 IMPLANT
SEAL XI 5MM-8MM UNIVERSAL (MISCELLANEOUS) ×8
SET IRRIG Y TYPE TUR BLADDER L (SET/KITS/TRAYS/PACK) ×2 IMPLANT
SET TRI-LUMEN FLTR TB AIRSEAL (TUBING) ×4 IMPLANT
SPONGE GAUZE 2X2 8PLY STER LF (GAUZE/BANDAGES/DRESSINGS) ×1
SPONGE GAUZE 2X2 8PLY STRL LF (GAUZE/BANDAGES/DRESSINGS) ×1 IMPLANT
SPONGE LAP 18X18 RF (DISPOSABLE) IMPLANT
SURGIFLO W/THROMBIN 8M KIT (HEMOSTASIS) IMPLANT
SURGILUBE 2OZ TUBE FLIPTOP (MISCELLANEOUS) ×4 IMPLANT
SUT ETHILON 2 0 PS N (SUTURE) ×2 IMPLANT
SUT MNCRL AB 4-0 PS2 18 (SUTURE) IMPLANT
SUT PDS AB 1 TP1 96 (SUTURE) IMPLANT
SUT VIC AB 0 CT1 27 (SUTURE) ×4
SUT VIC AB 0 CT1 27XBRD ANTBC (SUTURE) IMPLANT
SUT VIC AB 2-0 CT1 27 (SUTURE)
SUT VIC AB 2-0 CT1 TAPERPNT 27 (SUTURE) IMPLANT
SUT VIC AB 3-0 SH 27 (SUTURE) ×2
SUT VIC AB 3-0 SH 27XBRD (SUTURE) IMPLANT
SUT VIC AB 4-0 PS2 18 (SUTURE) ×8 IMPLANT
SYR 10ML LL (SYRINGE) IMPLANT
SYR BULB IRRIG 60ML STRL (SYRINGE) ×4 IMPLANT
TOWEL OR 17X26 10 PK STRL BLUE (TOWEL DISPOSABLE) ×4 IMPLANT
TOWEL OR NON WOVEN STRL DISP B (DISPOSABLE) ×4 IMPLANT
TRAP SPECIMEN MUCUS 40CC (MISCELLANEOUS) IMPLANT
TRAY FOLEY MTR SLVR 16FR STAT (SET/KITS/TRAYS/PACK) ×4 IMPLANT
TROCAR XCEL NON-BLD 5MMX100MML (ENDOMECHANICALS) IMPLANT
UNDERPAD 30X36 HEAVY ABSORB (UNDERPADS AND DIAPERS) ×6 IMPLANT
WATER STERILE IRR 1000ML POUR (IV SOLUTION) ×4 IMPLANT
WATER STERILE IRR 3000ML UROMA (IV SOLUTION) ×2 IMPLANT
YANKAUER SUCT BULB TIP 10FT TU (MISCELLANEOUS) ×2 IMPLANT

## 2021-06-05 NOTE — Transfer of Care (Signed)
Immediate Anesthesia Transfer of Care Note  Patient: Alexis Mclaughlin  Procedure(s) Performed: DILATATION AND CURETTAGE UNDER LAPAROSCOPIC GUIDANCE LAPAROSCOPY OPERATIVE XI ROBOTIC ASSISTED TOTAL HYSTERECTOMY WITH BILATERAL SALPINGO OOPHORECTOMY CYSTOSCOPY (Bladder)  Patient Location: PACU  Anesthesia Type:General  Level of Consciousness: awake, alert , oriented and patient cooperative  Airway & Oxygen Therapy: Patient Spontanous Breathing and Patient connected to face mask oxygen  Post-op Assessment: Report given to RN and Post -op Vital signs reviewed and stable  Post vital signs: Reviewed and stable  Last Vitals:  Vitals Value Taken Time  BP 132/75 06/05/21 1225  Temp    Pulse 102 06/05/21 1229  Resp 20 06/05/21 1229  SpO2 91 % 06/05/21 1229  Vitals shown include unvalidated device data.  Last Pain:  Vitals:   06/05/21 0727  TempSrc:   PainSc: 5       Patients Stated Pain Goal: 3 (62/56/38 9373)  Complications: No notable events documented.

## 2021-06-05 NOTE — Progress Notes (Signed)
Call rec'ed from [place], s/w Alexa, RN advised pt is not consented, per report rec'd dtr will be at bedside b/w 08:00-08:30 to complete consent, as pt. Is not competent to complete informed consent. Advised pt was scheduled to arrive for 06:00. States she will speak with charge nurse regarding pt's case. Advised to administer Metoprolol at 07:00. Neomia Dear, RN

## 2021-06-05 NOTE — Progress Notes (Signed)
OT Cancellation Note  Patient Details Name: Alexis Mclaughlin MRN: [id number] DOB: 1946-11-30   Cancelled Treatment:    Reason Eval/Treat Not Completed: Patient at procedure or test/ unavailable  Julien Girt 06/05/2021, 11:06 AM

## 2021-06-05 NOTE — NC FL2 (Signed)
[place] MEDICAID FL2 LEVEL OF CARE SCREENING TOOL     IDENTIFICATION  Patient Name: Alexis Mclaughlin Birthdate: September 11, 1946 Sex: female Admission Date (Current Location): 06/03/2021  [facility] and [place] Number:  Herbalist and Address:  [facility],  [address] [place], [place]      Provider Number: [id number]  Attending Physician Name and Address:  Elmarie Shiley, MD  Relative Name and Phone Number:  Einar Grad Daughter [phone], [place]   [phone]    Current Level of Care: Hospital Recommended Level of Care: [place] Prior Approval Number:    Date Approved/Denied:   PASRR Number: [id number]  Discharge Plan: SNF    Current Diagnoses: Patient Active Problem List   Diagnosis Date Noted   Clostridium perfringens infection 06/04/2021   Bacteremia 06/04/2021   Generalized weakness 06/04/2021   Hypertension    Hypomagnesemia    Chronic anemia    Hyponatremia 06/01/2021   Hypokalemia 06/01/2021   Chronic kidney disease 06/01/2021   Elevated AST (SGOT) 06/01/2021   Hypocalcemia 06/01/2021   Uterine abscess 06/01/2021   Sepsis ([place]) 06/01/2021   Rhabdomyolysis 06/01/2021   Frequent falls 06/01/2021   Diarrhea    Acute kidney injury ([place]) 02/10/2020   AKI (acute kidney injury) ([place]) 02/09/2020   Hypotension    Acute cystitis without hematuria    Chronic right shoulder pain    Hypothyroidism    Weakness    Type 2 diabetes mellitus with chronic kidney disease on chronic dialysis, without long-term current use of insulin ([place])     Orientation RESPIRATION BLADDER Height & Weight     Self, Time  O2 Incontinent Weight: 113.3 kg Height:  5\' 4"  (162.6 cm)  BEHAVIORAL SYMPTOMS/MOOD NEUROLOGICAL BOWEL NUTRITION STATUS      Incontinent Diet (Soft)  AMBULATORY STATUS COMMUNICATION OF NEEDS Skin   Total Care Verbally Surgical wounds (abdomen)                       Personal Care  Assistance Level of Assistance  Bathing, Feeding, Dressing, Total care Bathing Assistance: Maximum assistance Feeding assistance: Maximum assistance Dressing Assistance: Maximum assistance Total Care Assistance: Maximum assistance   Functional Limitations Info  Sight, Hearing, Speech Sight Info: Impaired Hearing Info: Adequate Speech Info: Adequate    SPECIAL CARE FACTORS FREQUENCY                       Contractures Contractures Info: Not present    Additional Factors Info  Code Status, Allergies Code Status Info: DNR Allergies Info: Ace Inhibitors           Current Medications (06/05/2021):  This is the current hospital active medication list Current Facility-Administered Medications  Medication Dose Route Frequency Provider Last Rate Last Admin   acetaminophen (TYLENOL) tablet 650 mg  650 mg Oral Q6H PRN Howerter, Justin B, DO   650 mg at 06/04/21 2028   Or   acetaminophen (TYLENOL) suppository 650 mg  650 mg Rectal Q6H PRN Howerter, Justin B, DO       Ampicillin-Sulbactam (UNASYN) 3 g in sodium chloride 0.9 % 100 mL IVPB  3 g Intravenous Q6H Poindexter, Leann T, RPH 200 mL/hr at 06/05/21 0458 3 g at 06/05/21 0458   atorvastatin (LIPITOR) tablet 80 mg  80 mg Oral Daily Howerter, Justin B, DO   80 mg at 06/04/21 0900   Chlorhexidine Gluconate Cloth 2 % PADS 6 each  6 each Topical  [id number] Joylene John D, NP   6 each at 06/05/21 0500   DULoxetine (CYMBALTA) DR capsule 60 mg  60 mg Oral Daily Howerter, Justin B, DO   60 mg at 06/04/21 0900   fentaNYL (SUBLIMAZE) injection 25 mcg  25 mcg Intravenous Q2H PRN Howerter, Justin B, DO   25 mcg at 06/04/21 2257   fluticasone (FLONASE) 50 MCG/ACT nasal spray 2 spray  2 spray Each Nare Daily Howerter, Justin B, DO   2 spray at 06/04/21 0907   insulin aspart (novoLOG) injection 0-6 Units  0-6 Units Subcutaneous TID WC Howerter, Justin B, DO   1 Units at 06/04/21 1738   levothyroxine (SYNTHROID) tablet 50 mcg  50 mcg Oral QAC  breakfast Howerter, Justin B, DO   50 mcg at 06/05/21 0548   metoprolol succinate (TOPROL-XL) 24 hr tablet 12.5 mg  12.5 mg Oral Daily Howerter, Justin B, DO   12.5 mg at 06/05/21 0654   Muscle Rub CREA   Topical PRN Howerter, Justin B, DO   1 application at 50/53/97 2230   naloxone (NARCAN) injection 0.4 mg  0.4 mg Intravenous PRN Howerter, Justin B, DO       pantoprazole (PROTONIX) EC tablet 40 mg  40 mg Oral Daily Howerter, Justin B, DO   40 mg at 06/04/21 0900   potassium chloride 10 mEq in 100 mL IVPB  10 mEq Intravenous Q1 Hr x 3 Regalado, Belkys A, MD 100 mL/hr at 06/05/21 1452 10 mEq at 06/05/21 1452     Discharge Medications: Please see discharge summary for a list of discharge medications.  Relevant Imaging Results:  Relevant Lab Results:   Additional Information [phone]  Purcell Mouton, RN

## 2021-06-05 NOTE — Interval H&P Note (Signed)
History and Physical Interval Note:  06/05/2021 7:58 AM  Alexis Mclaughlin  has presented today for surgery, with the diagnosis of ENLARGED UTERUS, UTERINE INFECTION.  The various methods of treatment have been discussed with the patient and family. After consideration of risks, benefits and other options for treatment, the patient has consented to  Procedure(s): DILATATION AND CURETTAGE WITH SUCTION UNDER LAPAROSCOPIC GUIDANCE (N/A) LAPAROSCOPY OPERATIVE (N/A) XI ROBOTIC ASSISTED TOTAL HYSTERECTOMY WITH BILATERAL SALPINGO OOPHORECTOMY (N/A) POSSIBLE MINI-LAPAROTOMY AND POSSIBLE STAGING (N/A) as a surgical intervention.  The patient's history has been reviewed, patient examined, no change in status, stable for surgery.  I have reviewed the patient's chart and labs.  Questions were answered to the patient's satisfaction.     [name]

## 2021-06-05 NOTE — Anesthesia Procedure Notes (Addendum)
Procedure Name: Intubation Date/Time: 06/05/2021 8:43 AM Performed by: Cleda Daub, CRNA Pre-anesthesia Checklist: Patient identified, Emergency Drugs available, Suction available and Patient being monitored Patient Re-evaluated:Patient Re-evaluated prior to induction Oxygen Delivery Method: Circle system utilized Preoxygenation: Pre-oxygenation with 100% oxygen Induction Type: IV induction Ventilation: Mask ventilation without difficulty Laryngoscope Size: Mac and 3 Grade View: Grade I Tube type: Oral Tube size: 7.0 mm Number of attempts: 1 Airway Equipment and Method: Stylet and Oral airway Placement Confirmation: ETT inserted through vocal cords under direct vision, positive ETCO2 and breath sounds checked- equal and bilateral Secured at: 21 cm Tube secured with: Tape Dental Injury: Teeth and Oropharynx as per pre-operative assessment  Comments: Teresa Pelton, SRNA intubated under Dr. Cherylynn Ridges supervision

## 2021-06-05 NOTE — Progress Notes (Signed)
PROGRESS NOTE    Alexis Mclaughlin  KVQ:[id number] DOB: 06-Dec-1946 DOA: 06/03/2021 PCP: Dionicia Abler, NP   Brief Narrative: 75 year old with past medical history significant for diabetes type 2, hypertension, hypothyroidism, chronic anemia with baseline hemoglobin--10-12, who was transferred from [facility] for further evaluation of uterine abscess.  Was admitted at [facility] months for sepsis secondary to uterine abscess.  Blood cultures positive for Clostridium perfringens.  Hospitalist at [facility] heart discussed the case with Dr. Berline Lopes gynecology oncology.  Patient was transferred to [facility] for further management. Patient underwent Dilation and curettage under laparoscopic guidance, laparoscopy robotic assisted total hysterectomy with bilateral salpingo-oophorectomy cystoscopy 6/14.    Assessment & Plan:   Principal Problem:   Uterine abscess Active Problems:   Type 2 diabetes mellitus with chronic kidney disease on chronic dialysis, without long-term current use of insulin (HCC)   Hypokalemia   Clostridium perfringens infection   Bacteremia   Generalized weakness   Hypertension   Hypomagnesemia   Chronic anemia  1-Sepsis secondary to uterine abscess with  Clostridium perfringens bacteremia: -Blood cultures grew Clostridium perfringens -ID following, continue with IV Unasyn -CT : Enlarge uterus with  extensive fluid and air within enlarged uterus consistent with uterine abscess.  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  Awaiting biopsy x.  Dr Berline Lopes following.    3-Acute Hypoxic  respiratory failure post surgery; Related to anesthesia. Wean off oxygen as tolerated Incentive spirometry Check  Hb.   4-Generalized weakness: Related to acute illness.   Chronic anemia: Monitor hemoglobin Diabetes type 2: Continue with sliding scale insulin Hypertension: Continue with metoprolol/  Hypothyroidism: Continue with Synthroid Hypokalemia: Replete IV     Estimated body mass index is 42.87 kg/m as calculated from the following:   Height as of this encounter: 5\' 4"  (1.626 m).   Weight as of this encounter: 113.3 kg.   DVT prophylaxis: SCD Code Status: DNR Family Communication: Daughter who was at bedside.  Disposition Plan:  Status is: Inpatient  Remains inpatient appropriate because:IV treatments appropriate due to intensity of illness or inability to take PO  Dispo: The patient is from: Home              Anticipated d/c is to: Home              Patient currently is not medically stable to d/c.   Difficult to place patient No        Consultants:  Dr. Berline Lopes.   Procedures:  Dilation and curettage under laparoscopic guidance, laparoscopy robotic assisted total hysterectomy with bilateral salpingo-oophorectomy cystoscopy 6/14  Antimicrobials:    Subjective: She is alert, denies worsening dyspnea. She remain on NB mask post surgery. Nurses will start weaning oxygen down.    Objective: Vitals:   06/05/21 1310 06/05/21 1315 06/05/21 1330 06/05/21 1345  BP:  (!) 141/77 (!) 141/75 139/81  Pulse:  100 96 95  Resp:  16 15 16   Temp:    (!) 97.5 F (36.4 C)  TempSrc:    Oral  SpO2: 99% 97% 97% 97%  Weight:      Height:        Intake/Output Summary (Last 24 hours) at 06/05/2021 1602 Last data filed at 06/05/2021 1204 Gross per 24 hour  Intake 1436 ml  Output 3300 ml  Net -1864  ml   Filed Weights   06/03/21 2113 06/04/21 0500 06/05/21 0500  Weight: 111.6 kg 112.6 kg 113.3 kg    Examination:  General exam: Appears calm and comfortable  Respiratory system: Clear to auscultation. Respiratory effort normal. Cardiovascular system: S1 & S2 heard, RRR. No  JVD, murmurs, rubs, gallops or clicks. No pedal edema. Gastrointestinal system: Abdomen is nondistended, soft and nontender. Multiples small incision.  Central nervous system: Alert and oriented. No focal neurological deficits. Extremities: Symmetric 5 x 5 power.   Data Reviewed: I have personally reviewed following labs and imaging studies  CBC: Recent Labs  Lab 06/01/21 0806 06/02/21 0541 06/03/21 0614 06/03/21 2315 06/04/21 0333 06/05/21 0328  WBC 25.3* 31.6* 23.8* 18.6* 16.7* 13.6*  NEUTROABS 21.4*  --   --   --  11.7* 8.2*  HGB 10.2* 11.2* 10.1* 9.3* 9.6* 9.3*  HCT 31.8* 34.9* 31.7* 29.4* 31.1* 30.0*  MCV 75.4* 73.9* 75.7* 75.4* 75.9* 76.3*  PLT 378 384 379 426* 410* 382*   Basic Metabolic Panel: Recent Labs  Lab 06/01/21 1001 06/02/21 0541 06/03/21 0614 06/03/21 2315 06/04/21 0333 06/05/21 0328  NA  --  136 138 134* 136 136  K  --  2.8* 3.4* 3.5 3.5 3.2*  CL  --  103 105 101 104 102  CO2  --  23 25 23 24 25   GLUCOSE  --  148* 146* 145* 138* 147*  BUN  --  12 22 18 18 10   CREATININE  --  0.80 0.88 1.00 0.94 0.63  CALCIUM  --  7.9* 7.7* 7.6* 7.6* 7.7*  MG 1.6*  --   --  1.9 1.8 1.7  PHOS  --   --   --   --   --  3.3   GFR: Estimated Creatinine Clearance: 73.8 mL/min (by C-G formula based on SCr of 0.63 mg/dL). Liver Function Tests: Recent Labs  Lab 06/01/21 0806 06/02/21 0541 06/04/21 0333 06/05/21 0328  AST 47* 85* 51* 40  ALT 25 32 32 27  ALKPHOS 160* 179* 159* 146*  BILITOT 1.3* 1.6* 0.8 0.5  PROT 7.0 6.8 6.3* 6.5  ALBUMIN 3.0* 2.6* 2.3* 2.3*   No results for input(s): LIPASE, AMYLASE in the last 168 hours. No results for input(s): AMMONIA in the last 168 hours. Coagulation Profile: Recent Labs  Lab 06/04/21 0333  INR 1.2   Cardiac Enzymes: Recent Labs  Lab 06/01/21 0806  CKTOTAL 935*   BNP (last 3 results) No results for input(s): PROBNP in the last 8760 hours. HbA1C: No results for input(s): HGBA1C in the last 72  hours. CBG: Recent Labs  Lab 06/04/21 1643 06/04/21 1932 06/05/21 0710 06/05/21 1230 06/05/21 1342  GLUCAP 161* 160* 146* 173* 172*   Lipid Profile: No results for input(s): CHOL, HDL, LDLCALC, TRIG, CHOLHDL, LDLDIRECT in the last 72 hours. Thyroid Function Tests: No results for input(s): TSH, T4TOTAL, FREET4, T3FREE, THYROIDAB in the last 72 hours. Anemia Panel: No results for input(s): VITAMINB12, FOLATE, FERRITIN, TIBC, IRON, RETICCTPCT in the last 72 hours. Sepsis Labs: Recent Labs  Lab 06/01/21 0806 06/01/21 1006 06/02/21 0541  PROCALCITON 53.76  --  58.78  LATICACIDVEN 3.7* 2.3*  --     Recent Results (from the past 240 hour(s))  Blood culture (routine x 2)     Status: Abnormal   Collection Time: 06/01/21  8:06 AM   Specimen: BLOOD  Result Value Ref Range Status   Specimen Description   Final    BLOOD RIGHT ANTECUBITAL  Performed at [facility], [address], [place], [place] [address]    Special Requests   Final    BOTTLES DRAWN AEROBIC AND ANAEROBIC Blood Culture adequate volume Performed at [facility], [address] [place], [place] [address]    Culture  Setup Time   Final    GRAM POSITIVE RODS ANAEROBIC BOTTLE ONLY CRITICAL RESULT CALLED TO, READ BACK BY AND VERIFIED WITH: KISHAN PATEL @1610  ON 06.10.22.SH    Culture (A)  Final    CLOSTRIDIUM PERFRINGENS Standardized susceptibility testing for this organism is not available. Performed at [facility], [place] [address], [place], [place] [address]    Report Status 06/04/2021 FINAL  Final  Blood culture (routine x 2)     Status: Abnormal   Collection Time: 06/01/21  8:06 AM   Specimen: BLOOD RIGHT HAND  Result Value Ref Range Status   Specimen Description   Final    BLOOD RIGHT HAND Performed at [facility], [address] [place], [place] [address]    Special Requests   Final    BOTTLES DRAWN AEROBIC AND ANAEROBIC Blood Culture results may not be optimal  due to an inadequate volume of blood received in culture bottles Performed at [facility], [address], [place], [place] [address]    Culture  Setup Time ANAEROBIC BOTTLE ONLY GRAM POSITIVE RODS   Final   Culture (A)  Final    CLOSTRIDIUM PERFRINGENS Standardized susceptibility testing for this organism is not available. Performed at [facility], [place] [address] [place], [place] [address]    Report Status 06/04/2021 FINAL  Final  Resp Panel by RT-PCR (Flu A&B, Covid) Nasopharyngeal Swab     Status: None   Collection Time: 06/01/21  8:06 AM   Specimen: Nasopharyngeal Swab; Nasopharyngeal(NP) swabs in vial transport medium  Result Value Ref Range Status   SARS Coronavirus 2 by RT PCR NEGATIVE NEGATIVE Final    Comment: (NOTE) SARS-CoV-2 target nucleic acids are NOT DETECTED.  The SARS-CoV-2 RNA is generally detectable in upper respiratory specimens during the acute phase of infection. The lowest concentration of SARS-CoV-2 viral copies this assay can detect is 138 copies/mL. A negative result does not preclude SARS-Cov-2 infection and should not be used as the sole basis for treatment or other patient management decisions. A negative result may occur with  improper specimen collection/handling, submission of specimen other than nasopharyngeal swab, presence of viral mutation(s) within the areas targeted by this assay, and inadequate number of viral copies(<138 copies/mL). A negative result must be combined with clinical observations, patient history, and epidemiological information. The expected result is Negative.  Fact Sheet for Patients:  EntrepreneurPulse.com.au  Fact Sheet for Healthcare Providers:  IncredibleEmployment.be  This test is no t yet approved or cleared by the [place] FDA and  has been authorized for detection and/or diagnosis of SARS-CoV-2 by FDA under an Emergency Use Authorization (EUA). This EUA  will remain  in effect (meaning this test can be used) for the duration of the COVID-19 declaration under Section 564(b)(1) of the Act, 21 U.S.C.section 360bbb-3(b)(1), unless the authorization is terminated  or revoked sooner.       Influenza A by PCR NEGATIVE NEGATIVE Final   Influenza B by PCR NEGATIVE NEGATIVE Final    Comment: (NOTE) The Xpert Xpress SARS-CoV-2/FLU/RSV plus assay is intended as an aid in the diagnosis of influenza from Nasopharyngeal swab specimens and should not be used as a sole basis  for treatment. Nasal washings and aspirates are unacceptable for Xpert Xpress SARS-CoV-2/FLU/RSV testing.  Fact Sheet for Patients: EntrepreneurPulse.com.au  Fact Sheet for Healthcare Providers: IncredibleEmployment.be  This test is not yet approved or cleared by the [place] FDA and has been authorized for detection and/or diagnosis of SARS-CoV-2 by FDA under an Emergency Use Authorization (EUA). This EUA will remain in effect (meaning this test can be used) for the duration of the COVID-19 declaration under Section 564(b)(1) of the Act, 21 U.S.C. section 360bbb-3(b)(1), unless the authorization is terminated or revoked.  Performed at [facility], [address] [place], [place] [address]   Culture, blood (Routine X 2) w Reflex to ID Panel     Status: None (Preliminary result)   Collection Time: 06/03/21 11:07 PM   Specimen: BLOOD  Result Value Ref Range Status   Specimen Description   Final    BLOOD LEFT ANTECUBITAL Performed at [facility] [address] [place], [place] [address]    Special Requests   Final    BOTTLES DRAWN AEROBIC ONLY Blood Culture adequate volume Performed at [facility] [address], [place], [place] [address]    Culture   Final    NO GROWTH 1 DAY Performed at [facility], [place] [address] [place], [place] [address]    Report Status  PENDING  Incomplete  Culture, blood (Routine X 2) w Reflex to ID Panel     Status: None (Preliminary result)   Collection Time: 06/03/21 11:07 PM   Specimen: BLOOD  Result Value Ref Range Status   Specimen Description   Final    BLOOD BLOOD LEFT HAND Performed at [facility] [address], [place], [place] [address]    Special Requests   Final    BOTTLES DRAWN AEROBIC ONLY Blood Culture adequate volume Performed at [place] [address], [place], [place] [address]    Culture   Final    NO GROWTH 1 DAY Performed at [facility], [place] [address] [place], [place] [address]    Report Status PENDING  Incomplete         Radiology Studies: No results found.      Scheduled Meds:  atorvastatin  80 mg Oral Daily   Chlorhexidine Gluconate Cloth  6 each Topical Q0600   DULoxetine  60 mg Oral Daily   fluticasone  2 spray Each Nare Daily   insulin aspart  0-6 Units Subcutaneous TID WC   levothyroxine  50 mcg Oral QAC breakfast   metoprolol succinate  12.5 mg Oral Daily   pantoprazole  40 mg Oral Daily   Continuous Infusions:  ampicillin-sulbactam (UNASYN) IV 3 g (06/05/21 0458)   potassium chloride 10 mEq (06/05/21 1452)     LOS: 2 days    Time spent: 35 minutes.     Elmarie Shiley, MD [facility]   If 7PM-7AM, please contact night-coverage [email or web address]  06/05/2021, 4:02 PM

## 2021-06-05 NOTE — NC FL2 (Signed)
[place] MEDICAID FL2 LEVEL OF CARE SCREENING TOOL     IDENTIFICATION  Patient Name: Alexis Mclaughlin Birthdate: 1946/03/06 Sex: female Admission Date (Current Location): 06/03/2021  [facility] and [place] Number:  Herbalist and Address:  [facility],  [place], [place]      Provider Number: [id number]  Attending Physician Name and Address:  Elmarie Shiley, MD  Relative Name and Phone Number:  Einar Grad Daughter [phone], [place]aves   [phone]    Current Level of Care: Hospital Recommended Level of Care: [place] Prior Approval Number:    Date Approved/Denied:   PASRR Number: [id number]  Discharge Plan: SNF    Current Diagnoses: Patient Active Problem List   Diagnosis Date Noted   Clostridium perfringens infection 06/04/2021   Bacteremia 06/04/2021   Generalized weakness 06/04/2021   Hypertension    Hypomagnesemia    Chronic anemia    Hyponatremia 06/01/2021   Hypokalemia 06/01/2021   Chronic kidney disease 06/01/2021   Elevated AST (SGOT) 06/01/2021   Hypocalcemia 06/01/2021   Uterine abscess 06/01/2021   Sepsis ([place]) 06/01/2021   Rhabdomyolysis 06/01/2021   Frequent falls 06/01/2021   Diarrhea    Acute kidney injury ([place]) 02/10/2020   AKI (acute kidney injury) ([place]) 02/09/2020   Hypotension    Acute cystitis without hematuria    Chronic right shoulder pain    Hypothyroidism    Weakness    Type 2 diabetes mellitus with chronic kidney disease on chronic dialysis, without long-term current use of insulin ([place])     Orientation RESPIRATION BLADDER Height & Weight     Self, Time  O2 Incontinent Weight: 113.3 kg Height:  5\' 4"  (162.6 cm)  BEHAVIORAL SYMPTOMS/MOOD NEUROLOGICAL BOWEL NUTRITION STATUS      Incontinent Diet (Soft)  AMBULATORY STATUS COMMUNICATION OF NEEDS Skin   Total Care Verbally Surgical wounds (abdomen)                       Personal Care  Assistance Level of Assistance  Bathing, Feeding, Dressing, Total care Bathing Assistance: Maximum assistance Feeding assistance: Maximum assistance Dressing Assistance: Maximum assistance Total Care Assistance: Maximum assistance   Functional Limitations Info  Sight, Hearing, Speech Sight Info: Impaired Hearing Info: Adequate Speech Info: Adequate    SPECIAL CARE FACTORS FREQUENCY                       Contractures Contractures Info: Not present    Additional Factors Info  Code Status, Allergies Code Status Info: DNR Allergies Info: Ace Inhibitors           Current Medications (06/05/2021):  This is the current hospital active medication list Current Facility-Administered Medications  Medication Dose Route Frequency Provider Last Rate Last Admin   acetaminophen (TYLENOL) tablet 650 mg  650 mg Oral Q6H PRN Howerter, Justin B, DO   650 mg at 06/04/21 2028   Or   acetaminophen (TYLENOL) suppository 650 mg  650 mg Rectal Q6H PRN Howerter, Justin B, DO       Ampicillin-Sulbactam (UNASYN) 3 g in sodium chloride 0.9 % 100 mL IVPB  3 g Intravenous Q6H Poindexter, Leann T, RPH 200 mL/hr at 06/05/21 0458 3 g at 06/05/21 0458   atorvastatin (LIPITOR) tablet 80 mg  80 mg Oral Daily Howerter, Justin B, DO   80 mg at 06/04/21 0900   Chlorhexidine Gluconate Cloth 2 % PADS 6 each  6 each Topical  [id number] Joylene John D, NP   6 each at 06/05/21 0500   DULoxetine (CYMBALTA) DR capsule 60 mg  60 mg Oral Daily Howerter, Justin B, DO   60 mg at 06/04/21 0900   fentaNYL (SUBLIMAZE) injection 25 mcg  25 mcg Intravenous Q2H PRN Howerter, Justin B, DO   25 mcg at 06/04/21 2257   fluticasone (FLONASE) 50 MCG/ACT nasal spray 2 spray  2 spray Each Nare Daily Howerter, Justin B, DO   2 spray at 06/04/21 0907   insulin aspart (novoLOG) injection 0-6 Units  0-6 Units Subcutaneous TID WC Howerter, Justin B, DO   1 Units at 06/04/21 1738   levothyroxine (SYNTHROID) tablet 50 mcg  50 mcg Oral QAC  breakfast Howerter, Justin B, DO   50 mcg at 06/05/21 0548   metoprolol succinate (TOPROL-XL) 24 hr tablet 12.5 mg  12.5 mg Oral Daily Howerter, Justin B, DO   12.5 mg at 06/05/21 0654   Muscle Rub CREA   Topical PRN Howerter, Justin B, DO   1 application at 39/67/28 2230   naloxone (NARCAN) injection 0.4 mg  0.4 mg Intravenous PRN Howerter, Justin B, DO       pantoprazole (PROTONIX) EC tablet 40 mg  40 mg Oral Daily Howerter, Justin B, DO   40 mg at 06/04/21 0900   potassium chloride 10 mEq in 100 mL IVPB  10 mEq Intravenous Q1 Hr x 3 Regalado, Belkys A, MD 100 mL/hr at 06/05/21 1452 10 mEq at 06/05/21 1452     Discharge Medications: Please see discharge summary for a list of discharge medications.  Relevant Imaging Results:  Relevant Lab Results:   Additional Information [phone]  Purcell Mouton, RN

## 2021-06-05 NOTE — Plan of Care (Signed)
  Problem: Clinical Measurements: Goal: Ability to maintain clinical measurements within normal limits will improve Outcome: Not Progressing Goal: Will remain free from infection Outcome: Not Progressing Goal: Respiratory complications will improve Outcome: Not Progressing Goal: Cardiovascular complication will be avoided Outcome: Not Progressing  WBCs remains elevated, pt with non-productive cough, Continues ST (100s), BP remains stable. Pt. Asymptomatic, will continue to monitor pt. [place]. Alfredo Batty

## 2021-06-05 NOTE — Progress Notes (Signed)
Called patient's daughter, [name], twice (immediately after surgery and now) to update her on surgery. No answer. LVM x2.  Jeral Pinch MD Gynecologic Oncology

## 2021-06-05 NOTE — Plan of Care (Signed)
  Problem: Activity: Goal: Risk for activity intolerance will decrease Outcome: Completed/Met

## 2021-06-05 NOTE — Anesthesia Postprocedure Evaluation (Signed)
Anesthesia Post Note  Patient: Alexis Mclaughlin  Procedure(s) Performed: DILATATION AND CURETTAGE UNDER LAPAROSCOPIC GUIDANCE LAPAROSCOPY OPERATIVE XI ROBOTIC ASSISTED TOTAL HYSTERECTOMY WITH BILATERAL SALPINGO OOPHORECTOMY CYSTOSCOPY (Bladder)     Patient location during evaluation: PACU Anesthesia Type: General Level of consciousness: awake and alert Pain management: pain level controlled Vital Signs Assessment: post-procedure vital signs reviewed and stable Respiratory status: spontaneous breathing, nonlabored ventilation, respiratory function stable and patient connected to nasal cannula oxygen Cardiovascular status: blood pressure returned to baseline and stable Postop Assessment: no apparent nausea or vomiting Anesthetic complications: no   No notable events documented.  Last Vitals:  Vitals:   06/05/21 1330 06/05/21 1345  BP: (!) 141/75 139/81  Pulse: 96 95  Resp: 15 16  Temp:  (!) 36.4 C  SpO2: 97% 97%    Last Pain:  Vitals:   06/05/21 1345  TempSrc: Oral  PainSc:                  Barnet Glasgow

## 2021-06-05 NOTE — Brief Op Note (Signed)
06/05/2021  12:09 PM  PATIENT:  Alexis Mclaughlin  75 y.o. female  PRE-OPERATIVE DIAGNOSIS:  ENLARGED UTERUS, UTERINE INFECTION  POST-OPERATIVE DIAGNOSIS:  ENLARGED UTERUS, UTERINE INFECTION  PROCEDURE:  Procedure(s): DILATATION AND CURETTAGE UNDER LAPAROSCOPIC GUIDANCE (N/A) LAPAROSCOPY OPERATIVE (N/A) XI ROBOTIC ASSISTED TOTAL HYSTERECTOMY WITH BILATERAL SALPINGO OOPHORECTOMY (N/A) CYSTOSCOPY  SURGEON:  Surgeon(s) and Role:    [name], MD - Primary    * Everitt Amber, MD - Assisting  ASSISTANTS: Elinor Parkinson, Melissa   ANESTHESIA:   general  EBL:  300 mL   BLOOD ADMINISTERED:none  DRAINS: 79F JP   LOCAL MEDICATIONS USED:  marcaine 0.25%  SPECIMEN:  curettings, uterus, cervix, bilateral tubes and ovaries, posterior cervix and upper vagina  DISPOSITION OF SPECIMEN:  PATHOLOGY  COUNTS:  YES  TOURNIQUET:  * No tourniquets in log *  DICTATION: .Note written in EPIC  PLAN OF CARE: Admit to inpatient   PATIENT DISPOSITION:  PACU - hemodynamically stable.   Delay start of Pharmacological VTE agent (>24hrs) due to surgical blood loss or risk of bleeding: no

## 2021-06-05 NOTE — Op Note (Signed)
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 ON speculum exam, enlarged cervix almost entirely necrotic with necrotic tissue prolapsing through dilated cervical os. Necrotic tissue on D&C, no significant return of tissue and gritty texture felt almost immediately. Cervix approximately 6cm. On intra-abdominal entry, normal upper abdominal survey including liver edge, stomach, and omentum. Normal appearing small and large bowel. Uterus 18cm, bulbous at fundus. Atrophic appearing adnexa. No breach of uterine capsule by tumor. Retroperitoneum significantly fibrotic and edematous, no obvious lymphadenopathy. After delivery of uterine specimen, significantly necrotic tissue noted along posterior colpotomy, likely with residual cervix in situ. After rectovaginal space created, additional posterior cervix excised down to level of healthy vagina. On cystoscopy, bladder dome intact, good efflux noted from bilateral ureteral orifices.   Estimated Blood Loss:  300cc      Total IV Fluids: see I&O flowsheet         Specimens: endometrial curettings, uterus, cervix, bilateral tubes and ovaries, posterior cervix and upper vagina         Complications:  None apparent; patient tolerated the procedure well.         Disposition: PACU -  hemodynamically stable.  Procedure Details  The patient was seen in the Holding Room. The risks, benefits, complications, treatment options, and expected outcomes were discussed with the patient.  The patient concurred with the proposed plan, giving informed consent.  The site of surgery properly noted/marked. The patient was identified as Alexis Mclaughlin and the procedure verified as a Robotic-assisted hysterectomy with bilateral salpingo oophorectomy, possible staging.   After induction of anesthesia, the patient was draped and prepped in the usual sterile manner. Patient was placed in supine position after anesthesia and draped and prepped in the usual sterile manner as follows: Her arms were tucked to her side with all appropriate precautions.  The shoulders were stabilized with padded shoulder blocks applied to the acromium processes.  The patient was placed in the semi-lithotomy position in [place].  The perineum and vagina were prepped with CholoraPrep. The patient was draped after the CholoraPrep had been allowed to dry for 3 minutes.  A Time Out was held and the above information confirmed.  The urethra was prepped with Betadine. Foley catheter was placed.  Surgeons gloves were then changed and attention was turned to the abdomen.  Next, a 10 mm skin incision was made 1 cm below the subcostal margin in the midclavicular line.  The 5 mm Optiview port and scope was used for direct entry.  Opening pressure was under 10 mm CO2.  The abdomen was insufflated and the findings were noted as above.   At this point and all points during the procedure, the patient's intra-abdominal pressure did not exceed 15 mmHg.  The patient was first placed in Trendelenburg for a tilt test.  Her pelvis was also examined at this time to make sure that the uterus was intact with no significant adhesions to other intra-abdominal organs.  The decision was then made to proceed with dilation and curettage under  laparoscopic guidance.  The patient was taken out of steep Trendelenburg.  A sterile speculum was placed in the vagina.  The cervix was grasped with a single-tooth tenaculum.  The cervix was grasped anteriorly that where there was very little normal and nonnecrotic cervix.  Cervix was already noted to be quite dilated.  A uterine sound was placed to just over 14 cm.  A banjo curette was then used to perform a curettage.  Gritty texture was noticed almost immediately.  There was some necrotic tumor that was collected on curettage but no significant fluid or tissue obtained.  Cultures were collected and some of this tissue was also sent for culture.  The ZUMI uterine manipulator was then placed with a large colpotomizer ring was placed without difficulty.  A pneum occluder balloon was placed over the manipulator.  OG tube placement was confirmed and to suction.   Surgeons gloves were then changed and attention returned to the abdomen.  Next, an 8 mm skin incision was made superior to the umbilicus and a right and left port were placed about 8 cm lateral to the robot port on the right and left side.  A fourth arm was placed on the right.  The 5 mm assist trocar was exchanged for a 10-12 mm port. All ports were placed under direct visualization.  The patient was placed in steep Trendelenburg.  Bowels were already noted to be in the upper abdomen.  The robot was docked in the normal manner.  The right and left peritoneum were opened parallel to the IP ligament to open the retroperitoneal spaces bilaterally.  Significant fibrosis and edema was noted along the retroperitoneum.  The round ligaments were transected. The ureter was noted to be on the medial leaf of the broad ligament.  The peritoneum above the ureter was incised and stretched and the infundibulopelvic ligament was skeletonized, cauterized and cut.    The posterior peritoneum was taken down to the level of the KOH ring.  The anterior peritoneum was also  taken down.  The bladder flap was created to the level of the KOH ring.  The uterine artery on the right side was skeletonized, cauterized and cut in the normal manner.  A similar procedure was performed on the left.  The colpotomy was made.  The uterine manipulator was then removed.  Given the size of the uterus, a 15 mm spleen bag was inserted through the vagina and the the uterus, cervix, bilateral ovaries and tubes placed within the Endo Catch bag.  The bag was then brought out through the vagina.  A combination of triple tooth Lahey and scissors were used to partially morcellate the necrotic uterus to allow for delivery of the remaining fundus within the bag through the vagina.  Upon robotic inspection of the cuff, there was significant necrotic tissue noted along the posterior aspect.  At this point, I called my partner, Dr. Denman George, to come evaluate regarding closure of the vaginal cuff versus removal of additional tissue.  She felt that there was still some cervix and that ultimately healthy vaginal tissue existed.  The pararectal space was then developed sharply and with short bursts of electrocautery, dropping the rectum well below the cervicovaginal junction.  Pararectal spaces were developed further.  Once assuring ureters were lateral and after pararectal space was developed, electrocautery was used to excise the posterior cervix and upper posterior vagina.  The specimen was then  handed out through the vagina.  The colpotomy at the vaginal cuff was closed with Vicryl on a CT1 needle with a figure-of-eight at each apex and interrupted figure of 8 stitches along the midline.  Two 0 PDS figure-of-eight's were used to reinforce the midline of the incision.    Some bleeding was noted from the bladder peritoneum.  A figure-of-eight with 3-0 Vicryl was used to achieve hemostasis. Irrigation was used and hemostasis was achieved.  At this point in the procedure was completed.    Robotic instruments were  removed under direct visualization after a 64F JP drain was positioned within the surgical bed of the pelvis via the right lateral port.  Under direct visualization, this port was then removed assuring that the drain stayed within the pelvis.  The drain was then secured to the skin using 2-0 nylon. The robot was undocked.  The abdomen was desufflated and trochars removed.  The fascia at the 10-12 mm port was closed with 0 Vicryl on a UR-5 needle.  The subcuticular tissue was closed with 4-0 Vicryl and the skin was closed with 4-0 Monocryl in a subcuticular manner.  Dermabond was applied.    Cystoscopy was then performed after the bladder was instilled with 250 cc of sterile fluid.  Bladder dome was noted to be intact and no sutures visible within the bladder.  Strong efflux was noted from bilateral ureteral orifices.  The vagina was swabbed with  minimal bleeding noted.   All sponge, lap and needle counts were correct x  3.  A new Foley catheter was replaced after cystoscopy was performed.  The patient was transferred to the recovery room in stable condition.  Jeral Pinch, MD

## 2021-06-06 ENCOUNTER — Encounter (HOSPITAL_COMMUNITY): Payer: Self-pay | Admitting: Gynecologic Oncology

## 2021-06-06 LAB — CBC
HCT: 29.1 % — ABNORMAL LOW (ref 36.0–46.0)
Hemoglobin: 8.9 g/dL — ABNORMAL LOW (ref 12.0–15.0)
MCH: 23.5 pg — ABNORMAL LOW (ref 26.0–34.0)
MCHC: 30.6 g/dL (ref 30.0–36.0)
MCV: 77 fL — ABNORMAL LOW (ref 80.0–100.0)
Platelets: 453 10*3/uL — ABNORMAL HIGH (ref 150–400)
RBC: 3.78 MIL/uL — ABNORMAL LOW (ref 3.87–5.11)
RDW: 15.9 % — ABNORMAL HIGH (ref 11.5–15.5)
WBC: 16.6 10*3/uL — ABNORMAL HIGH (ref 4.0–10.5)
nRBC: 0 % (ref 0.0–0.2)

## 2021-06-06 LAB — GLUCOSE, CAPILLARY
Glucose-Capillary: 166 mg/dL — ABNORMAL HIGH (ref 70–99)
Glucose-Capillary: 196 mg/dL — ABNORMAL HIGH (ref 70–99)
Glucose-Capillary: 214 mg/dL — ABNORMAL HIGH (ref 70–99)
Glucose-Capillary: 158 mg/dL — ABNORMAL HIGH (ref 70–99)

## 2021-06-06 LAB — BASIC METABOLIC PANEL
Anion gap: 9 (ref 5–15)
BUN: 12 mg/dL (ref 8–23)
CO2: 27 mmol/L (ref 22–32)
Calcium: 7.7 mg/dL — ABNORMAL LOW (ref 8.9–10.3)
Chloride: 102 mmol/L (ref 98–111)
Creatinine, Ser: 0.91 mg/dL (ref 0.44–1.00)
GFR, Estimated: >60 mL/min (ref >60)
Glucose, Bld: 162 mg/dL — ABNORMAL HIGH (ref 70–99)
Potassium: 3.5 mmol/L (ref 3.5–5.1)
Sodium: 138 mmol/L (ref 135–145)

## 2021-06-06 MED ORDER — AMOXICILLIN-POT CLAVULANATE 875-125 MG PO TABS
1.0000 | ORAL_TABLET | Freq: Two times a day (BID) | ORAL | Status: DC
  Administered 2021-06-06 – 2021-06-08 (×4): 1 via ORAL
  Filled 2021-06-06 (×4): qty 1

## 2021-06-06 NOTE — Progress Notes (Signed)
[facility] for Infectious Disease    Date of Admission:  06/03/2021   Total days of antibiotics 6          ID: Alexis Mclaughlin is a 75 y.o. female with   Principal Problem:   Uterine abscess Active Problems:   Type 2 diabetes mellitus with chronic kidney disease on chronic dialysis, without long-term current use of insulin (HCC)   Hypokalemia   Clostridium perfringens infection   Bacteremia   Generalized weakness   Hypertension   Hypomagnesemia   Chronic anemia   Uterine mass    Subjective: Afebrile. Tolerated robotic total hysterectomy. Uterine mass c/w malignancy; has right shoulder pain from arthritis  Medications:   amoxicillin-clavulanate  1 tablet Oral Q12H   atorvastatin  80 mg Oral Daily   Chlorhexidine Gluconate Cloth  6 each Topical Daily   DULoxetine  60 mg Oral Daily   fluticasone  2 spray Each Nare Daily   heparin injection (subcutaneous)  5,000 Units Subcutaneous Q8H   insulin aspart  0-6 Units Subcutaneous TID WC   levothyroxine  50 mcg Oral QAC breakfast   metoprolol succinate  12.5 mg Oral Daily   pantoprazole  40 mg Oral Daily    Objective: Vital signs in last 24 hours: Temp:  [98.2 F (36.8 C)-99.2 F (37.3 C)] 98.2 F (36.8 C) (06/15 1213) Pulse Rate:  [106-110] 109 (06/15 1213) Resp:  [16-18] 18 (06/15 1213) BP: (138-159)/(66-81) 142/78 (06/15 1213) SpO2:  [95 %-100 %] 96 % (06/15 1213) Weight:  [108.6 kg] 108.6 kg (06/15 0428) Physical Exam  Constitutional:  oriented to person, place, and time. appears well-developed and well-nourished. No distress.  HENT: [place]/AT, PERRLA, no scleral icterus Mouth/Throat: Oropharynx is clear and moist. No oropharyngeal exudate.  Cardiovascular: Normal rate, regular rhythm and normal heart sounds. Exam reveals no gallop and no friction rub.  No murmur heard.  Pulmonary/Chest: Effort normal and breath sounds normal. No respiratory distress.  has no wheezes.  Neck = supple, no nuchal rigidity Abdominal:  Soft. Bowel sounds are normal.  exhibits no distension. There is no tenderness. Jp bulb with serosanginous fluid on right side Lymphadenopathy: no cervical adenopathy. No axillary adenopathy Neurological: alert and oriented to person, place, and time.  Skin: Skin is warm and dry. No rash noted. No erythema.  Psychiatric: a normal mood and affect.  behavior is normal.    Lab Results Recent Labs    06/05/21 0328 06/05/21 1519 06/06/21 0334 06/06/21 0335  WBC 13.6* 19.2*  --  16.6*  HGB 9.3* 10.8*  --  8.9*  HCT 30.0* 36.0  --  29.1*  NA 136  --  138  --   K 3.2*  --  3.5  --   CL 102  --  102  --   CO2 25  --  27  --   BUN 10  --  12  --   CREATININE 0.63  --  0.91  --    Liver Panel Recent Labs    06/04/21 0333 06/05/21 0328  PROT 6.3* 6.5  ALBUMIN 2.3* 2.3*  AST 51* 40  ALT 32 27  ALKPHOS 159* 146*  BILITOT 0.8 0.5   Microbiology: 6/10 clostridial + bacteremia Studies/Results: No results found.   Assessment/Plan: 75yo F with uterine abscess with secondary clostridium perfinges bacteremia s/p source control -plan to treat for total of 14 days. Can transition to amox/clav bid to treat bacteremia    Will sign off   Carlyle Basques  [facility] for Infectious Diseases Cell: [phone] Pager: [phone]  06/06/2021, 5:24 PM

## 2021-06-06 NOTE — Progress Notes (Signed)
1 Day Post-Op Procedure(s) (LRB): DILATATION AND CURETTAGE UNDER LAPAROSCOPIC GUIDANCE (N/A) LAPAROSCOPY OPERATIVE (N/A) XI ROBOTIC ASSISTED TOTAL HYSTERECTOMY WITH BILATERAL SALPINGO OOPHORECTOMY (N/A) CYSTOSCOPY  Subjective: Patient reports no pain this am. No nausea or emesis reported but has not taken in anything solid. Has not been out of bed. Reports passing flatus. No BM reported. No concerns voiced.    Objective: Vital signs in last 24 hours: Temp:  [97.5 F (36.4 C)-99.2 F (37.3 C)] 99.2 F (37.3 C) (06/15 0428) Pulse Rate:  [95-110] 106 (06/15 0428) Resp:  [13-19] 18 (06/15 0133) BP: (132-159)/(66-81) 138/72 (06/15 0428) SpO2:  [85 %-100 %] 100 % (06/15 0428) Weight:  [239 lb 6.7 oz (108.6 kg)] 239 lb 6.7 oz (108.6 kg) (06/15 0428) Last BM Date: 06/02/21  Intake/Output from previous day: 06/14 0701 - 06/15 0700 In: 1874.1 [P.O.:360; I.V.:1200; IV Piggyback:314.1] Out: 1620 [Urine:1250; Drains:70; Blood:300]  Physical Examination: General: cooperative, no distress, and sleeping but responding to verbal stimuli Resp: clear to auscultation bilaterally Cardio: regular rate and rhythm, S1, S2 normal, no murmur, click, rub or gallop and slightly tachycardic GI: incision: lap sites to the abdomen with dermabond without erythema or drainage and abdomen obese, hypoactive bowel sounds, appropriate tenderness with light palpation. JP drain dressing saturated. Dressing removed. No active drainage noted from the site. Drain stripped by Dr. Berline Lopes and new dressing applied. Extremities: extremities normal, atraumatic, no cyanosis or edema and SCDs applied. Vaginal Bleeding: none noted on peri pad Foley to straight drain with yellow urine. 175 cc in the bag  Labs: WBC/Hgb/Hct/Plts:  16.6/8.9/29.1/453 (06/15 0335)    Assessment: 75 y.o. s/p Procedure(s): DILATATION AND CURETTAGE UNDER LAPAROSCOPIC GUIDANCE LAPAROSCOPY OPERATIVE, XI ROBOTIC ASSISTED TOTAL HYSTERECTOMY WITH  BILATERAL SALPINGO OOPHORECTOMY, CYSTOSCOPY: stable Pain:  Pain is well-controlled on PRN medications.  Heme: Hgb 8.9 and Hct 29.1 this am. Expected given preop values and surgical losses.  ID: WBC 16.6 this am. On IV Unasyn for uterine abscess. Blood cultures from 06/01/21 growing clostridium perfringens and from 06/03/21 with no growth. Plan to continue IV antibiotics and defer to Hospitalist/ID for duration.   CV: Slightly tachycardic. Metoprolol ordered. Continue to monitor.  GI:  Tolerating po: Yes. Tolerating liquids. No solid food taken in per patient. Antiemetics ordered as needed.  GU: Foley in place. Adequate output recorded. Creatinine   FEN: Bmet to be ordered this am.  Endo: Diabetes mellitus Type II, under good control. CBG: CBG (last 3)  Recent Labs    06/05/21 1618 06/05/21 2209 06/06/21 0732  GLUCAP 170* 151* 166*    Prophylaxis: SCDs applied and heparin ordered to start this am at 10 am.  Plan: -Bmet added on to labs from this am. -Per Dr. Berline Lopes, attending to continue to be the Hospitalist Team and GYN Onc will continue to follow. -Follow up on uterine culture taken in the OR on 06/05/21. Follow up on blood culture results from 06/03/21. -Recommend reviewing duration of IV antibiotics with ID once repeat blood cultures and uterine culture are finalized. -Discontinue foley. -Nursing staff to assist patient with IS use. JP dressing change daily. -Out of bed with assist. PT order placed preop due to deconditioning weakness. -Given patient's living situation in the setting of clinical status and deconditioning, would recommend consideration for SNF placement. -Continue plan of care per Hospitalist Team, GYN Onc.   LOS: 3 days    Alexis Mclaughlin 06/06/2021, 8:47 AM

## 2021-06-06 NOTE — Progress Notes (Signed)
OT Cancellation Note  Patient Details Name: Alexis Mclaughlin MRN: [id number] DOB: 1946-08-16   Cancelled Treatment:    Reason Eval/Treat Not Completed: Patient declined, no reason specified: Attempted OT Evaluation at [id number]. Pt was awake, but refused to work with OT, saying "not now, no."  And would not open eyes.  Pt agreed to work with OT in PM.   Returned at 1326 and pt beginning lunch in bed with RN present. Offered to assist pt to chair for meal and pt shouted, "No! My back hurts. No!" Reported to pt that OT would reattempt tomorrow. Pt did not respond.   Julien Girt 06/06/2021, 1:28 PM

## 2021-06-06 NOTE — Progress Notes (Signed)
Pharmacy Antibiotic Note  Alexis Mclaughlin is a 75 y.o. female admitted on 06/03/2021. Pharmacy has been consulted for Unasyn dosing for Uterine abscess, Clostridium perfringens bacteremia now s/p hysterectomy. ID following  Day 4 Unasyn WBC 16.6 down SCr stable Afebrile  Plan: Continue Unasyn 3 g IV q6h per current renal function Plan per ID note to continue abx for at least 14 days after source control  Height: 5\' 4"  (162.6 cm) Weight: 108.6 kg (239 lb 6.7 oz) IBW/kg (Calculated) : 54.7  Temp (24hrs), Avg:98.2 F (36.8 C), Min:97.5 F (36.4 C), Max:99.2 F (37.3 C)  Recent Labs  Lab 06/01/21 0806 06/01/21 1006 06/02/21 0541 06/03/21 0614 06/03/21 2315 06/04/21 0333 06/05/21 0328 06/05/21 1519 06/06/21 0335  WBC 25.3*  --  31.6* 23.8* 18.6* 16.7* 13.6* 19.2* 16.6*  CREATININE 1.20*  --  0.80 0.88 1.00 0.94 0.63  --   --   LATICACIDVEN 3.7* 2.3*  --   --   --   --   --   --   --      Estimated Creatinine Clearance: 72.1 mL/min (by C-G formula based on SCr of 0.63 mg/dL).    Allergies  Allergen Reactions   Ace Inhibitors     Antimicrobials this admission: Cefepime 6/10 >> 6/10 Ceftriaxone 6/10 >> 6/12 Metronidazole 6/10 >> 6/12 Unasyn 6/12 >>   Microbiology results: 6/10 BCx: 2/2 sets clostridium perfringens 6/12 BCx: ngtd  Thank you for allowing pharmacy to be a part of this patient's care.  Adrian Saran, PharmD, BCPS Secure Chat if ?s 06/06/2021 9:26 AM

## 2021-06-06 NOTE — Progress Notes (Signed)
PROGRESS NOTE    Alexis Mclaughlin  RWE:[id number] DOB: 10-16-1946 DOA: 06/03/2021 PCP: Dionicia Abler, NP   Brief Narrative: 75 year old with past medical history significant for diabetes type 2, hypertension, hypothyroidism, chronic anemia with baseline hemoglobin--10-12, who was transferred from [facility] for further evaluation of uterine abscess.  Was admitted at [facility] months for sepsis secondary to uterine abscess.  Blood cultures positive for Clostridium perfringens.  Hospitalist at [facility] discussed the case with Dr. Berline Lopes gynecology oncology.  Patient was transferred to [facility] for further management. Patient underwent Dilation and curettage under laparoscopic guidance, laparoscopy robotic assisted total hysterectomy with bilateral salpingo-oophorectomy cystoscopy 6/14.    Assessment & Plan:   Principal Problem:   Uterine abscess Active Problems:   Type 2 diabetes mellitus with chronic kidney disease on chronic dialysis, without long-term current use of insulin (HCC)   Hypokalemia   Clostridium perfringens infection   Bacteremia   Generalized weakness   Hypertension   Hypomagnesemia   Chronic anemia   Uterine mass  1-Sepsis secondary to uterine abscess with  Clostridium perfringens bacteremia: -Blood cultures grew Clostridium perfringens -ID following, continue with IV Unasyn -CT : Enlarge uterus with  extensive fluid and air within enlarged uterus consistent with uterine abscess.  -Patient underwent; Dilation and curettage under laparoscopic guidance, laparoscopy robotic assisted total hysterectomy with bilateral salpingo-oophorectomy cystoscopy 6/14. -ID recommend 14 days of antibiotics after Source controlled.  -WCB down to 16 from 19.   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  Awaiting biopsy x.  Dr Berline Lopes following.    3-Acute Hypoxic   respiratory failure post surgery; Related to anesthesia. Post surgery, improved.  Required 15 L NB mask, down to 2 L today.  Incentive spirometry Hb stable.   4-Generalized weakness: Related to acute illness. Might need SNF  Chronic anemia: Monitor hemoglobin Diabetes type 2: Continue with sliding scale insulin Hypertension: Continue with metoprolol/  Hypothyroidism: Continue with Synthroid Hypokalemia: Replete orally.      Estimated body mass index is 41.1 kg/m as calculated from the following:   Height as of this encounter: 5\' 4"  (1.626 m).   Weight as of this encounter: 108.6 kg.   DVT prophylaxis: SCD Code Status: DNR Family Communication: Daughter who was at bedside.  Disposition Plan:  Status is: Inpatient  Remains inpatient appropriate because:IV treatments appropriate due to intensity of illness or inability to take PO  Dispo: The patient is from: Home              Anticipated d/c is to: Might need SNF              Patient currently is not medically stable to d/c.   Difficult to place patient No        Consultants:  Dr. Berline Lopes.   Procedures:  Dilation and curettage under laparoscopic guidance, laparoscopy robotic assisted total hysterectomy with bilateral salpingo-oophorectomy cystoscopy 6/14  Antimicrobials:    Subjective: She denies abdominal pain. She is breathing well.    Objective: Vitals:   06/06/21 0133 06/06/21 0428 06/06/21 0848 06/06/21 1213  BP: 138/71 138/72 (!) 150/76 (!) 142/78  Pulse: (!) 110 (!) 106 (!) 106 (!) 109  Resp: 18   18  Temp: 98.3 F (36.8 C) 99.2 F (37.3 C)  98.2 F (36.8 C)  TempSrc: Oral Oral    SpO2: 96% 100%  96%  Weight:  108.6 kg    Height:  Intake/Output Summary (Last 24 hours) at 06/06/2021 1456 Last data filed at 06/06/2021 1305 Gross per 24 hour  Intake 574.09 ml  Output 1185 ml  Net -610.91 ml    Filed Weights   06/04/21 0500 06/05/21 0500 06/06/21 0428  Weight: 112.6 kg 113.3 kg  108.6 kg    Examination:  General exam: NAD Respiratory system: Decreased breath sound, CTA Cardiovascular system: S 1, S 2 RRR Gastrointestinal system: BS present, soft, mild tender. Multiples small incision. Drain in place Central nervous system: alert Extremities: trace edema  Data Reviewed: I have personally reviewed following labs and imaging studies  CBC: Recent Labs  Lab 06/01/21 0806 06/02/21 0541 06/03/21 2315 06/04/21 0333 06/05/21 0328 06/05/21 1519 06/06/21 0335  WBC 25.3*   < > 18.6* 16.7* 13.6* 19.2* 16.6*  NEUTROABS 21.4*  --   --  11.7* 8.2*  --   --   HGB 10.2*   < > 9.3* 9.6* 9.3* 10.8* 8.9*  HCT 31.8*   < > 29.4* 31.1* 30.0* 36.0 29.1*  MCV 75.4*   < > 75.4* 75.9* 76.3* 78.8* 77.0*  PLT 378   < > 426* 410* 408* 408* 453*   < > = values in this interval not displayed.    Basic Metabolic Panel: Recent Labs  Lab 06/01/21 1001 06/02/21 0541 06/03/21 0614 06/03/21 2315 06/04/21 0333 06/05/21 0328 06/06/21 0334  NA  --    < > 138 134* 136 136 138  K  --    < > 3.4* 3.5 3.5 3.2* 3.5  CL  --    < > 105 101 104 102 102  CO2  --    < > 25 23 24 25 27   GLUCOSE  --    < > 146* 145* 138* 147* 162*  BUN  --    < > 22 18 18 10 12   CREATININE  --    < > 0.88 1.00 0.94 0.63 0.91  CALCIUM  --    < > 7.7* 7.6* 7.6* 7.7* 7.7*  MG 1.6*  --   --  1.9 1.8 1.7  --   PHOS  --   --   --   --   --  3.3  --    < > = values in this interval not displayed.    GFR: Estimated Creatinine Clearance: 63.4 mL/min (by C-G formula based on SCr of 0.91 mg/dL). Liver Function Tests: Recent Labs  Lab 06/01/21 0806 06/02/21 0541 06/04/21 0333 06/05/21 0328  AST 47* 85* 51* 40  ALT 25 32 32 27  ALKPHOS 160* 179* 159* 146*  BILITOT 1.3* 1.6* 0.8 0.5  PROT 7.0 6.8 6.3* 6.5  ALBUMIN 3.0* 2.6* 2.3* 2.3*    No results for input(s): LIPASE, AMYLASE in the last 168 hours. No results for input(s): AMMONIA in the last 168 hours. Coagulation Profile: Recent Labs  Lab  06/04/21 0333  INR 1.2    Cardiac Enzymes: Recent Labs  Lab 06/01/21 0806  CKTOTAL 935*    BNP (last 3 results) No results for input(s): PROBNP in the last 8760 hours. HbA1C: No results for input(s): HGBA1C in the last 72 hours. CBG: Recent Labs  Lab 06/05/21 1342 06/05/21 1618 06/05/21 2209 06/06/21 0732 06/06/21 1143  GLUCAP 172* 170* 151* 166* 196*    Lipid Profile: No results for input(s): CHOL, HDL, LDLCALC, TRIG, CHOLHDL, LDLDIRECT in the last 72 hours. Thyroid Function Tests: No results for input(s): TSH, T4TOTAL, FREET4, T3FREE, THYROIDAB in the last  72 hours. Anemia Panel: No results for input(s): VITAMINB12, FOLATE, FERRITIN, TIBC, IRON, RETICCTPCT in the last 72 hours. Sepsis Labs: Recent Labs  Lab 06/01/21 0806 06/01/21 1006 06/02/21 0541  PROCALCITON 53.76  --  58.78  LATICACIDVEN 3.7* 2.3*  --      Recent Results (from the past 240 hour(s))  Blood culture (routine x 2)     Status: Abnormal   Collection Time: 06/01/21  8:06 AM   Specimen: BLOOD  Result Value Ref Range Status   Specimen Description   Final    BLOOD RIGHT ANTECUBITAL Performed at [facility], [address], [place], [place] [address]    Special Requests   Final    BOTTLES DRAWN AEROBIC AND ANAEROBIC Blood Culture adequate volume Performed at [facility], [address] [place], [place] [address]    Culture  Setup Time   Final    GRAM POSITIVE RODS ANAEROBIC BOTTLE ONLY CRITICAL RESULT CALLED TO, READ BACK BY AND VERIFIED WITH: KISHAN PATEL @1610  ON 06.10.22.SH    Culture (A)  Final    CLOSTRIDIUM PERFRINGENS Standardized susceptibility testing for this organism is not available. Performed at [facility], [place] [address], [place], [place] [address]    Report Status 06/04/2021 FINAL  Final  Blood culture (routine x 2)     Status: Abnormal   Collection Time: 06/01/21  8:06 AM   Specimen: BLOOD RIGHT HAND  Result Value Ref Range Status    Specimen Description   Final    BLOOD RIGHT HAND Performed at [facility], [address] [place], [place] [address]    Special Requests   Final    BOTTLES DRAWN AEROBIC AND ANAEROBIC Blood Culture results may not be optimal due to an inadequate volume of blood received in culture bottles Performed at [facility], [address] [place], [place] [address]    Culture  Setup Time ANAEROBIC BOTTLE ONLY GRAM POSITIVE RODS   Final   Culture (A)  Final    CLOSTRIDIUM PERFRINGENS Standardized susceptibility testing for this organism is not available. Performed at [facility], [place] [address], [place], [place] [address]    Report Status 06/04/2021 FINAL  Final  Resp Panel by RT-PCR (Flu A&B, Covid) Nasopharyngeal Swab     Status: None   Collection Time: 06/01/21  8:06 AM   Specimen: Nasopharyngeal Swab; Nasopharyngeal(NP) swabs in vial transport medium  Result Value Ref Range Status   SARS Coronavirus 2 by RT PCR NEGATIVE NEGATIVE Final    Comment: (NOTE) SARS-CoV-2 target nucleic acids are NOT DETECTED.  The SARS-CoV-2 RNA is generally detectable in upper respiratory specimens during the acute phase of infection. The lowest concentration of SARS-CoV-2 viral copies this assay can detect is 138 copies/mL. A negative result does not preclude SARS-Cov-2 infection and should not be used as the sole basis for treatment or other patient management decisions. A negative result may occur with  improper specimen collection/handling, submission of specimen other than nasopharyngeal swab, presence of viral mutation(s) within the areas targeted by this assay, and inadequate number of viral copies(<138 copies/mL). A negative result must be combined with clinical observations, patient history, and epidemiological information. The expected result is Negative.  Fact Sheet for Patients:  EntrepreneurPulse.com.au  Fact Sheet for Healthcare Providers:   IncredibleEmployment.be  This test is no t yet approved or cleared by the [place] FDA and  has been authorized for detection and/or diagnosis of SARS-CoV-2 by FDA under  an Emergency Use Authorization (EUA). This EUA will remain  in effect (meaning this test can be used) for the duration of the COVID-19 declaration under Section 564(b)(1) of the Act, 21 U.S.C.section 360bbb-3(b)(1), unless the authorization is terminated  or revoked sooner.       Influenza A by PCR NEGATIVE NEGATIVE Final   Influenza B by PCR NEGATIVE NEGATIVE Final    Comment: (NOTE) The Xpert Xpress SARS-CoV-2/FLU/RSV plus assay is intended as an aid in the diagnosis of influenza from Nasopharyngeal swab specimens and should not be used as a sole basis for treatment. Nasal washings and aspirates are unacceptable for Xpert Xpress SARS-CoV-2/FLU/RSV testing.  Fact Sheet for Patients: EntrepreneurPulse.com.au  Fact Sheet for Healthcare Providers: IncredibleEmployment.be  This test is not yet approved or cleared by the [place] FDA and has been authorized for detection and/or diagnosis of SARS-CoV-2 by FDA under an Emergency Use Authorization (EUA). This EUA will remain in effect (meaning this test can be used) for the duration of the COVID-19 declaration under Section 564(b)(1) of the Act, 21 U.S.C. section 360bbb-3(b)(1), unless the authorization is terminated or revoked.  Performed at [facility], [address] [place], [place] [address]   Culture, blood (Routine X 2) w Reflex to ID Panel     Status: None (Preliminary result)   Collection Time: 06/03/21 11:07 PM   Specimen: BLOOD  Result Value Ref Range Status   Specimen Description   Final    BLOOD LEFT ANTECUBITAL Performed at [facility] [address], [place], [place] [address]    Special Requests   Final    BOTTLES DRAWN AEROBIC ONLY Blood Culture  adequate volume Performed at [facility] [address], [place], [place] [address]    Culture   Final    NO GROWTH 2 DAYS Performed at [facility] [address], [place], [place] [address]    Report Status PENDING  Incomplete  Culture, blood (Routine X 2) w Reflex to ID Panel     Status: None (Preliminary result)   Collection Time: 06/03/21 11:07 PM   Specimen: BLOOD  Result Value Ref Range Status   Specimen Description   Final    BLOOD BLOOD LEFT HAND Performed at [facility] [address] [place], [place] [address]    Special Requests   Final    BOTTLES DRAWN AEROBIC ONLY Blood Culture adequate volume Performed at [facility] [address], [place], [place] [address]    Culture   Final    NO GROWTH 2 DAYS Performed at [facility] [address] [place], [place] [address]    Report Status PENDING  Incomplete  Aerobic/Anaerobic Culture w Gram Stain (surgical/deep wound)     Status: None (Preliminary result)   Collection Time: 06/05/21  9:09 AM   Specimen: PATH Gyn biopsy; Tissue  Result Value Ref Range Status   Specimen Description   Final    TISSUE ENDOMETRIAL CURETTAGE EMC Performed at [facility] [address], [place], [place] [address]    Special Requests   Final    NONE Performed at [facility], [place] [address], [place],  [address]    Gram Stain   Final    RARE WBC PRESENT, PREDOMINANTLY MONONUCLEAR NO ORGANISMS SEEN    Culture   Final    NO GROWTH < 24 HOURS Performed at [facility], [place] [address]  [place], [place] [address]    Report Status PENDING  Incomplete          Radiology Studies: No results found.      Scheduled Meds:  atorvastatin  80 mg Oral Daily   Chlorhexidine Gluconate Cloth  6 each Topical Daily   DULoxetine  60 mg Oral Daily   fluticasone  2 spray Each Nare Daily   heparin injection  (subcutaneous)  5,000 Units Subcutaneous Q8H   insulin aspart  0-6 Units Subcutaneous TID WC   levothyroxine  50 mcg Oral QAC breakfast   metoprolol succinate  12.5 mg Oral Daily   pantoprazole  40 mg Oral Daily   Continuous Infusions:  ampicillin-sulbactam (UNASYN) IV 3 g (06/06/21 1243)   dextrose 5 % and 0.45 % NaCl with KCl 20 mEq/L 50 mL/hr at 06/06/21 0728     LOS: 3 days    Time spent: 35 minutes.     Elmarie Shiley, MD [facility]   If 7PM-7AM, please contact night-coverage [email or web address]  06/06/2021, 2:56 PM

## 2021-06-06 NOTE — TOC Progression Note (Signed)
Transition of Care [facility]) - Progression Note    Patient Details  Name: Alexis Mclaughlin MRN: [id number] Date of Birth: [birth year]  Transition of [facility]) CM/SW Contact  Purcell Mouton, RN Phone Number: 06/06/2021, 9:48 AM  Clinical Narrative:     A return call was made to [place] with no answer, left VM.        Expected Discharge Plan and Services           Expected Discharge Date:  (unknown)                                     Social Determinants of Health (SDOH) Interventions    Readmission Risk Interventions No flowsheet data found.

## 2021-06-06 NOTE — TOC Progression Note (Signed)
Transition of Care [facility]) - Progression Note    Patient Details  Name: Alexis Mclaughlin MRN: [id number] Date of Birth: [birth year]  Transition of Care [facility]) CM/SW Contact  Purcell Mouton, RN Phone Number: 06/06/2021, 4:10 PM  Clinical Narrative:     Spoke with [facility] CSW, Pt will discharge to SNF. Have faxed pt to facilities that contact with [facility].        Expected Discharge Plan and Services           Expected Discharge Date:  (unknown)                                     Social Determinants of Health (SDOH) Interventions    Readmission Risk Interventions No flowsheet data found.

## 2021-06-07 LAB — GLUCOSE, CAPILLARY
Glucose-Capillary: 178 mg/dL — ABNORMAL HIGH (ref 70–99)
Glucose-Capillary: 149 mg/dL — ABNORMAL HIGH (ref 70–99)
Glucose-Capillary: 192 mg/dL — ABNORMAL HIGH (ref 70–99)

## 2021-06-07 LAB — CBC WITH DIFFERENTIAL/PLATELET
Abs Immature Granulocytes: 0.64 10*3/uL — ABNORMAL HIGH (ref 0.00–0.07)
Basophils Absolute: 0.1 10*3/uL (ref 0.0–0.1)
Basophils Relative: 1 %
Eosinophils Absolute: 0.8 10*3/uL — ABNORMAL HIGH (ref 0.0–0.5)
Eosinophils Relative: 6 %
HCT: 29.6 % — ABNORMAL LOW (ref 36.0–46.0)
Hemoglobin: 8.7 g/dL — ABNORMAL LOW (ref 12.0–15.0)
Immature Granulocytes: 4 %
Lymphocytes Relative: 18 %
Lymphs Abs: 2.7 10*3/uL (ref 0.7–4.0)
MCH: 23.3 pg — ABNORMAL LOW (ref 26.0–34.0)
MCHC: 29.4 g/dL — ABNORMAL LOW (ref 30.0–36.0)
MCV: 79.1 fL — ABNORMAL LOW (ref 80.0–100.0)
Monocytes Absolute: 2 10*3/uL — ABNORMAL HIGH (ref 0.1–1.0)
Monocytes Relative: 13 %
Neutro Abs: 8.9 10*3/uL — ABNORMAL HIGH (ref 1.7–7.7)
Neutrophils Relative %: 58 %
Platelets: 442 10*3/uL — ABNORMAL HIGH (ref 150–400)
RBC: 3.74 MIL/uL — ABNORMAL LOW (ref 3.87–5.11)
RDW: 16.2 % — ABNORMAL HIGH (ref 11.5–15.5)
WBC: 15.1 10*3/uL — ABNORMAL HIGH (ref 4.0–10.5)
nRBC: 0 % (ref 0.0–0.2)

## 2021-06-07 LAB — BASIC METABOLIC PANEL
Anion gap: 8 (ref 5–15)
BUN: 10 mg/dL (ref 8–23)
CO2: 26 mmol/L (ref 22–32)
Calcium: 7.6 mg/dL — ABNORMAL LOW (ref 8.9–10.3)
Chloride: 100 mmol/L (ref 98–111)
Creatinine, Ser: 0.83 mg/dL (ref 0.44–1.00)
GFR, Estimated: >60 mL/min (ref >60)
Glucose, Bld: 164 mg/dL — ABNORMAL HIGH (ref 70–99)
Potassium: 3.8 mmol/L (ref 3.5–5.1)
Sodium: 134 mmol/L — ABNORMAL LOW (ref 135–145)

## 2021-06-07 LAB — SURGICAL PATHOLOGY

## 2021-06-07 MED ORDER — LIDOCAINE 5 % EX PTCH
1.0000 | MEDICATED_PATCH | CUTANEOUS | Status: DC
  Filled 2021-06-07: qty 1

## 2021-06-07 MED ORDER — POLYETHYLENE GLYCOL 3350 17 G PO PACK
17.0000 g | PACK | Freq: Every day | ORAL | Status: DC
  Administered 2021-06-07 – 2021-06-08 (×2): 17 g via ORAL
  Filled 2021-06-07 (×2): qty 1

## 2021-06-07 NOTE — Progress Notes (Addendum)
2 Days Post-Op Procedure(s) (LRB): DILATATION AND CURETTAGE UNDER LAPAROSCOPIC GUIDANCE (N/A) LAPAROSCOPY OPERATIVE (N/A) XI ROBOTIC ASSISTED TOTAL HYSTERECTOMY WITH BILATERAL SALPINGO OOPHORECTOMY (N/A) CYSTOSCOPY  Subjective: Patient reports generalized aching all over. No nausea or emesis reported. Tolerating solid foods per pts daughter. Sat on the side of bed and got to chair with PT assist. Reports passing some flatus. Unsure about having a bowel movement. Daughter at the bedside. No concerns voiced.    Objective: Vital signs in last 24 hours: Temp:  [98 F (36.7 C)-98.7 F (37.1 C)] 98.7 F (37.1 C) (06/16 0431) Pulse Rate:  [97-109] 97 (06/16 0431) Resp:  [18] 18 (06/15 1213) BP: (129-142)/(66-78) 137/70 (06/16 0431) SpO2:  [94 %-96 %] 95 % (06/16 0431) Weight:  [246 lb 3.2 oz (111.7 kg)] 246 lb 3.2 oz (111.7 kg) (06/16 0431) Last BM Date: 06/02/21  Intake/Output from previous day: 06/15 0701 - 06/16 0700 In: 1335 [P.O.:360; I.V.:975] Out: 640 [Urine:600; Drains:40]  Physical Examination: General: alert, cooperative, and no distress Resp: clear to auscultation bilaterally Cardio: regular rate and rhythm, S1, S2 normal, no murmur, click, rub or gallop GI: incision: lap sites to the abdomen with dermabond without erythema or drainage and abdomen obese, active bowel sounds, appropriate tenderness with light palpation. JP drain dressing intact. No active drainage noted from the site. Drain stripped by Dr. Berline Lopes. Extremities: extremities normal, atraumatic, no cyanosis or edema and SCDs applied. Vaginal Bleeding: none noted on peri pad Purewick in place  Labs: WBC/Hgb/Hct/Plts:  15.1/8.7/29.6/442 (06/16 0340) BUN/Cr/glu/ALT/AST/amyl/lip:  10/0.83/--/--/--/--/-- (06/16 0340)  Assessment: 75 y.o. s/p Procedure(s): DILATATION AND CURETTAGE UNDER LAPAROSCOPIC GUIDANCE LAPAROSCOPY OPERATIVE, XI ROBOTIC ASSISTED TOTAL HYSTERECTOMY WITH BILATERAL SALPINGO OOPHORECTOMY,  CYSTOSCOPY: stable Pain:  Pain is well-controlled on PRN medications.  Heme: Hgb 8.7 and Hct 29.6 this am. Stable and appropriate given preop values and surgical losses.  ID: WBC 15.1 today from 16.6. On IV Unasyn for uterine abscess. Blood cultures from 06/01/21 growing clostridium perfringens and from 06/03/21 with no growth. Plan to continue IV antibiotics and defer to Hospitalist/ID for duration.   CV: Tachycardia improving. Metoprolol ordered. Continue to monitor.  GI:  Tolerating po: Yes. Antiemetics ordered as needed.  GU: Purewick in place. Adequate output recorded. Creatinine 0.83 this am.  FEN: No critical values on Bmet.  Endo: Diabetes mellitus Type II, under good control. CBG: CBG (last 3)  Recent Labs    06/06/21 1611 06/06/21 2115 06/07/21 0715  GLUCAP 214* 158* 178*    Prophylaxis: SCDs and heparin ordered.  Plan: -IV[facility] to [facility]. -Follow up on uterine culture taken in the OR on 06/05/21. Follow up on blood culture results from 06/03/21. -Nursing staff to assist patient with IS use. JP dressing change daily. -Out of bed with assist. PT order placed preop due to deconditioning weakness. -Given patient's living situation in the setting of clinical status and deconditioning, would recommend consideration for SNF placement. -Continue plan of care per Hospitalist Team. -Patient is meeting discharge criteria from a surgical standpoint. We will see the patient tomorrow and remove the JP drain and will sign off. Contact GYN Onc (Dr. Denman George) on call over the weekend for surgery related questions.    LOS: 4 days    Dorothyann Gibbs 06/07/2021, 11:28 AM

## 2021-06-07 NOTE — Progress Notes (Signed)
PROGRESS NOTE    Alexis Mclaughlin  CZY:[id number] DOB: [birth year] DOA: 06/03/2021 PCP: Dionicia Abler, NP   Brief Narrative: 75 year old with past medical history significant for diabetes type 2, hypertension, hypothyroidism, chronic anemia with baseline hemoglobin--10-12, who was transferred from [facility] for further evaluation of uterine abscess.  Was admitted at [facility] months for sepsis secondary to uterine abscess.  Blood cultures positive for Clostridium perfringens.  Hospitalist at [facility] heart discussed the case with Dr. Berline Lopes gynecology oncology.  Patient was transferred to [facility] for further management. Patient underwent Dilation and curettage under laparoscopic guidance, laparoscopy robotic assisted total hysterectomy with bilateral salpingo-oophorectomy cystoscopy 6/14.    Assessment & Plan:   Principal Problem:   Uterine abscess Active Problems:   Type 2 diabetes mellitus with chronic kidney disease on chronic dialysis, without long-term current use of insulin (HCC)   Hypokalemia   Clostridium perfringens infection   Bacteremia   Generalized weakness   Hypertension   Hypomagnesemia   Chronic anemia   Uterine mass  1-Sepsis secondary to uterine abscess with  Clostridium perfringens bacteremia: -Blood cultures grew Clostridium perfringens -ID following, continue with IV Unasyn -CT : Enlarge uterus with  extensive fluid and air within enlarged uterus consistent with uterine abscess.  -Patient underwent; Dilation and curettage under laparoscopic guidance, laparoscopy robotic assisted total hysterectomy with bilateral salpingo-oophorectomy cystoscopy 6/14. -ID recommend 14 days of antibiotics after Source controlled. Plan to discharge on Augmentin.  -WCB down to 15 from 19.   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  Awaiting biopsy x.  Dr Berline Lopes following.     3-Acute Hypoxic  respiratory failure post surgery; Related to anesthesia. Post surgery, improved.  Required 15 L NB mask, down to 2 L today.  Incentive spirometry Hb stable.   4-Generalized weakness: Related to acute illness. She will need Rehab.   Chronic anemia: Monitor hemoglobin, hb stable.  Diabetes type 2: Continue with sliding scale insulin Hypertension: Continue with metoprolol. Hypothyroidism: Continue with Synthroid. Hypokalemia: Replaced.      Estimated body mass index is 42.26 kg/m as calculated from the following:   Height as of this encounter: 5\' 4"  (1.626 m).   Weight as of this encounter: 111.7 kg.   DVT prophylaxis: SCD Code Status: DNR Family Communication: Daughter who was at bedside 6/15  Disposition Plan:  Status is: Inpatient  Remains inpatient appropriate because:IV treatments appropriate due to intensity of illness or inability to take PO  Dispo: The patient is from: Home              Anticipated d/c is to: SNF              Patient currently is not medically stable to d/c.plan on discharging tomorrow after JP drain is removed.    Difficult to place patient No        Consultants:  Dr. Berline Lopes.   Procedures:  Dilation and curettage under laparoscopic guidance, laparoscopy robotic assisted total hysterectomy with bilateral salpingo-oophorectomy cystoscopy 6/14  Antimicrobials:    Subjective: She is having mild abdominal pain. She is tolerating diet.    Objective: Vitals:   06/06/21 0848 06/06/21 1213 06/06/21 2017 06/07/21 0431  BP: (!) 150/76 (!) 142/78 129/66 137/70  Pulse: (!) 106 (!) 109 (!) 102 97  Resp:  18    Temp:  98.2 F (36.8 C) 98 F (36.7 C) 98.7 F (37.1 C)  TempSrc:  SpO2:  96% 94% 95%  Weight:    111.7 kg  Height:        Intake/Output Summary (Last 24 hours) at 06/07/2021 1633 Last data filed at 06/07/2021 1300 Gross per 24 hour  Intake 1574.95 ml  Output 810 ml  Net 764.95 ml    Filed Weights    06/05/21 0500 06/06/21 0428 06/07/21 0431  Weight: 113.3 kg 108.6 kg 111.7 kg    Examination:  General exam: NAD Respiratory system: Decreases breath sound, CTA Cardiovascular system: S 1, S 2 RRR Gastrointestinal system: BS present, soft, nt Drain in place Central nervous system: Alert Extremities: Trace edema  Data Reviewed: I have personally reviewed following labs and imaging studies  CBC: Recent Labs  Lab 06/01/21 0806 06/02/21 0541 06/04/21 0333 06/05/21 0328 06/05/21 1519 06/06/21 0335 06/07/21 0340  WBC 25.3*   < > 16.7* 13.6* 19.2* 16.6* 15.1*  NEUTROABS 21.4*  --  11.7* 8.2*  --   --  8.9*  HGB 10.2*   < > 9.6* 9.3* 10.8* 8.9* 8.7*  HCT 31.8*   < > 31.1* 30.0* 36.0 29.1* 29.6*  MCV 75.4*   < > 75.9* 76.3* 78.8* 77.0* 79.1*  PLT 378   < > 410* 408* 408* 453* 442*   < > = values in this interval not displayed.    Basic Metabolic Panel: Recent Labs  Lab 06/01/21 1001 06/02/21 0541 06/03/21 2315 06/04/21 0333 06/05/21 0328 06/06/21 0334 06/07/21 0340  NA  --    < > 134* 136 136 138 134*  K  --    < > 3.5 3.5 3.2* 3.5 3.8  CL  --    < > 101 104 102 102 100  CO2  --    < > 23 24 25 27 26   GLUCOSE  --    < > 145* 138* 147* 162* 164*  BUN  --    < > 18 18 10 12 10   CREATININE  --    < > 1.00 0.94 0.63 0.91 0.83  CALCIUM  --    < > 7.6* 7.6* 7.7* 7.7* 7.6*  MG 1.6*  --  1.9 1.8 1.7  --   --   PHOS  --   --   --   --  3.3  --   --    < > = values in this interval not displayed.    GFR: Estimated Creatinine Clearance: 70.5 mL/min (by C-G formula based on SCr of 0.83 mg/dL). Liver Function Tests: Recent Labs  Lab 06/01/21 0806 06/02/21 0541 06/04/21 0333 06/05/21 0328  AST 47* 85* 51* 40  ALT 25 32 32 27  ALKPHOS 160* 179* 159* 146*  BILITOT 1.3* 1.6* 0.8 0.5  PROT 7.0 6.8 6.3* 6.5  ALBUMIN 3.0* 2.6* 2.3* 2.3*    No results for input(s): LIPASE, AMYLASE in the last 168 hours. No results for input(s): AMMONIA in the last 168  hours. Coagulation Profile: Recent Labs  Lab 06/04/21 0333  INR 1.2    Cardiac Enzymes: Recent Labs  Lab 06/01/21 0806  CKTOTAL 935*    BNP (last 3 results) No results for input(s): PROBNP in the last 8760 hours. HbA1C: No results for input(s): HGBA1C in the last 72 hours. CBG: Recent Labs  Lab 06/06/21 1143 06/06/21 1611 06/06/21 2115 06/07/21 0715 06/07/21 1214  GLUCAP 196* 214* 158* 178* 149*    Lipid Profile: No results for input(s): CHOL, HDL, LDLCALC, TRIG, CHOLHDL, LDLDIRECT in the last 72 hours.  Thyroid Function Tests: No results for input(s): TSH, T4TOTAL, FREET4, T3FREE, THYROIDAB in the last 72 hours. Anemia Panel: No results for input(s): VITAMINB12, FOLATE, FERRITIN, TIBC, IRON, RETICCTPCT in the last 72 hours. Sepsis Labs: Recent Labs  Lab 06/01/21 0806 06/01/21 1006 06/02/21 0541  PROCALCITON 53.76  --  58.78  LATICACIDVEN 3.7* 2.3*  --      Recent Results (from the past 240 hour(s))  Blood culture (routine x 2)     Status: Abnormal   Collection Time: 06/01/21  8:06 AM   Specimen: BLOOD  Result Value Ref Range Status   Specimen Description   Final    BLOOD RIGHT ANTECUBITAL Performed at [facility], [address], [place], [place] [address]    Special Requests   Final    BOTTLES DRAWN AEROBIC AND ANAEROBIC Blood Culture adequate volume Performed at [facility], [address], [place], [place] [address]    Culture  Setup Time   Final    GRAM POSITIVE RODS ANAEROBIC BOTTLE ONLY CRITICAL RESULT CALLED TO, READ BACK BY AND VERIFIED WITH: KISHAN PATEL @1610  ON 06.10.22.SH    Culture (A)  Final    CLOSTRIDIUM PERFRINGENS Standardized susceptibility testing for this organism is not available. Performed at [facility], [place] [address] [place], [place] [address]    Report Status 06/04/2021 FINAL  Final  Blood culture (routine x 2)     Status: Abnormal   Collection Time: 06/01/21  8:06 AM   Specimen: BLOOD  RIGHT HAND  Result Value Ref Range Status   Specimen Description   Final    BLOOD RIGHT HAND Performed at [facility], [address] [place], [place] [address]    Special Requests   Final    BOTTLES DRAWN AEROBIC AND ANAEROBIC Blood Culture results may not be optimal due to an inadequate volume of blood received in culture bottles Performed at [facility], [address] [place], [place] [address]    Culture  Setup Time ANAEROBIC BOTTLE ONLY GRAM POSITIVE RODS   Final   Culture (A)  Final    CLOSTRIDIUM PERFRINGENS Standardized susceptibility testing for this organism is not available. Performed at [facility], [place] [address], [place], [place] [address]    Report Status 06/04/2021 FINAL  Final  Resp Panel by RT-PCR (Flu A&B, Covid) Nasopharyngeal Swab     Status: None   Collection Time: 06/01/21  8:06 AM   Specimen: Nasopharyngeal Swab; Nasopharyngeal(NP) swabs in vial transport medium  Result Value Ref Range Status   SARS Coronavirus 2 by RT PCR NEGATIVE NEGATIVE Final    Comment: (NOTE) SARS-CoV-2 target nucleic acids are NOT DETECTED.  The SARS-CoV-2 RNA is generally detectable in upper respiratory specimens during the acute phase of infection. The lowest concentration of SARS-CoV-2 viral copies this assay can detect is 138 copies/mL. A negative result does not preclude SARS-Cov-2 infection and should not be used as the sole basis for treatment or other patient management decisions. A negative result may occur with  improper specimen collection/handling, submission of specimen other than nasopharyngeal swab, presence of viral mutation(s) within the areas targeted by this assay, and inadequate number of viral copies(<138 copies/mL). A negative result must be combined with clinical observations, patient history, and epidemiological information. The expected result is Negative.  Fact Sheet for Patients:   EntrepreneurPulse.com.au  Fact Sheet for Healthcare Providers:  IncredibleEmployment.be  This test is no t yet approved or cleared by the [place]  FDA and  has been authorized for detection and/or diagnosis of SARS-CoV-2 by FDA under an Emergency Use Authorization (EUA). This EUA will remain  in effect (meaning this test can be used) for the duration of the COVID-19 declaration under Section 564(b)(1) of the Act, 21 U.S.C.section 360bbb-3(b)(1), unless the authorization is terminated  or revoked sooner.       Influenza A by PCR NEGATIVE NEGATIVE Final   Influenza B by PCR NEGATIVE NEGATIVE Final    Comment: (NOTE) The Xpert Xpress SARS-CoV-2/FLU/RSV plus assay is intended as an aid in the diagnosis of influenza from Nasopharyngeal swab specimens and should not be used as a sole basis for treatment. Nasal washings and aspirates are unacceptable for Xpert Xpress SARS-CoV-2/FLU/RSV testing.  Fact Sheet for Patients: EntrepreneurPulse.com.au  Fact Sheet for Healthcare Providers: IncredibleEmployment.be  This test is not yet approved or cleared by the [place] FDA and has been authorized for detection and/or diagnosis of SARS-CoV-2 by FDA under an Emergency Use Authorization (EUA). This EUA will remain in effect (meaning this test can be used) for the duration of the COVID-19 declaration under Section 564(b)(1) of the Act, 21 U.S.C. section 360bbb-3(b)(1), unless the authorization is terminated or revoked.  Performed at [facility], [address] [place], [place] [address]   Culture, blood (Routine X 2) w Reflex to ID Panel     Status: None (Preliminary result)   Collection Time: 06/03/21 11:07 PM   Specimen: BLOOD  Result Value Ref Range Status   Specimen Description   Final    BLOOD LEFT ANTECUBITAL Performed at [facility] [address], [place],  [place] [address]    Special Requests   Final    BOTTLES DRAWN AEROBIC ONLY Blood Culture adequate volume Performed at [facility] [address], [place], [place] [address]    Culture   Final    NO GROWTH 3 DAYS Performed at [facility], [place] [address] [place], [place] [address]    Report Status PENDING  Incomplete  Culture, blood (Routine X 2) w Reflex to ID Panel     Status: None (Preliminary result)   Collection Time: 06/03/21 11:07 PM   Specimen: BLOOD  Result Value Ref Range Status   Specimen Description   Final    BLOOD BLOOD LEFT HAND Performed at [place] [address], [place], [place] [address]    Special Requests   Final    BOTTLES DRAWN AEROBIC ONLY Blood Culture adequate volume Performed at [place] [address], [place], [place] [address]    Culture   Final    NO GROWTH 3 DAYS Performed at [facility], [place] [address] [place], [place] [address]    Report Status PENDING  Incomplete  Aerobic/Anaerobic Culture w Gram Stain (surgical/deep wound)     Status: None (Preliminary result)   Collection Time: 06/05/21  9:09 AM   Specimen: PATH Gyn biopsy; Tissue  Result Value Ref Range Status   Specimen Description   Final    TISSUE ENDOMETRIAL CURETTAGE EMC Performed at [place] [address] [place], [place] [address]    Special Requests   Final    NONE Performed at [facility], [place] [address], [place], [place] [address]    Gram Stain   Final    RARE WBC PRESENT, PREDOMINANTLY MONONUCLEAR NO ORGANISMS SEEN    Culture   Final  NO GROWTH 2 DAYS NO ANAEROBES ISOLATED; CULTURE IN PROGRESS FOR 5 DAYS Performed at [facility] [address], [place], [place] [address]    Report Status PENDING  Incomplete          Radiology Studies: No results found.      Scheduled Meds:  amoxicillin-clavulanate  1 tablet Oral Q12H    atorvastatin  80 mg Oral Daily   Chlorhexidine Gluconate Cloth  6 each Topical Daily   DULoxetine  60 mg Oral Daily   fluticasone  2 spray Each Nare Daily   heparin injection (subcutaneous)  5,000 Units Subcutaneous Q8H   insulin aspart  0-6 Units Subcutaneous TID WC   levothyroxine  50 mcg Oral QAC breakfast   metoprolol succinate  12.5 mg Oral Daily   pantoprazole  40 mg Oral Daily   Continuous Infusions:  dextrose 5 % and 0.45 % NaCl with KCl 20 mEq/L 10 mL/hr at 06/07/21 1216     LOS: 4 days    Time spent: 35 minutes.     Elmarie Shiley, MD [facility]   If 7PM-7AM, please contact night-coverage [email or web address]  06/07/2021, 4:33 PM

## 2021-06-07 NOTE — Plan of Care (Signed)
  Problem: Clinical Measurements: Goal: Ability to maintain clinical measurements within normal limits will improve Outcome: Progressing Goal: Will remain free from infection Outcome: Progressing Goal: Respiratory complications will improve Outcome: Progressing Goal: Cardiovascular complication will be avoided Outcome: Progressing   Problem: Nutrition: Goal: Adequate nutrition will be maintained Outcome: Progressing   Problem: Coping: Goal: Level of anxiety will decrease Outcome: Progressing   

## 2021-06-07 NOTE — Care Management Important Message (Signed)
Important Message  Patient Details IM Letter given to the Patient. Name: Maika Mcelveen MRN: [id number] Date of Birth: 07/25/1946   Medicare Important Message Given:  Yes     Kerin Salen 06/07/2021, 10:20 AM

## 2021-06-07 NOTE — Progress Notes (Addendum)
Physical Therapy Treatment Patient Details Name: Alexis Mclaughlin MRN: [id number] DOB: 1946/10/25 Today's Date: 06/07/2021    History of Present Illness 75 yo female admitted with uterine abscess, fall, UTI. Hx of obesity, DM, anemia, chronic pain (shoulders). Pt now s/p DILATATION AND CURETTAGE UNDER LAPAROSCOPIC GUIDANCE (N/A)  LAPAROSCOPY OPERATIVE (N/A)  XI ROBOTIC ASSISTED TOTAL HYSTERECTOMY WITH BILATERAL SALPINGO OOPHORECTOMY (N/A)  CYSTOSCOPY on 06/05/2021. 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    PT Comments    +2 max assist for supine to sit. Attempted sit to stand x 2 trials with RW and max assist of 2, pt unable to come to full upright position. Instructed pt in bed level BLE strengthening exercises. Pt c/o 9/10 pain in B shoulders with all attempts to move.  She had IR oxycodone prior to PT session, and has lidocaine patches on B shoulders. Pt is anxious and easily agitated.  She requires much encouragement to participate in PT session. Pt's daughter was present during session and provided encouragement to pt. Pt has had significant decline from her baseline of walking independently with a RW.    Follow Up Recommendations  SNF     Equipment Recommendations  None recommended by PT    Recommendations for Other Services       Precautions / Restrictions Precautions Precautions: Fall Precaution Comments: 2 falls related to UTIs in past 1 year Restrictions Weight Bearing Restrictions: No    Mobility  Bed Mobility Overal bed mobility: Needs Assistance Bed Mobility: Sit to Supine;Supine to Sit     Supine to sit: Max assist;+2 for physical assistance Sit to supine: +2 for physical assistance;Max assist   General bed mobility comments: Assist for trunk and bil LEs. Increased time. Encouragement for pt to try to do as much as she could on her own. Pt is easily aggravated.    Transfers Overall transfer level: Needs assistance Equipment used: Rolling walker (2  wheeled) Transfers: Sit to/from Stand Sit to Stand: Max assist;+2 physical assistance         General transfer comment: attempted sit to stand x 2 trials with RW, pt unable to fully clear buttocks from bed with +2 max assist  Ambulation/Gait             General Gait Details: unable. per daughter, pt ambulated with RW at baseline   Stairs             Wheelchair Mobility    Modified Rankin (Stroke Patients Only)       Balance Overall balance assessment: Needs assistance;History of Falls Sitting-balance support: Feet supported Sitting balance-Leahy Scale: Fair       Standing balance-Leahy Scale: Zero                              Cognition Arousal/Alertness: Awake/alert Behavior During Therapy: Agitated;Anxious Overall Cognitive Status: Within Functional Limits for tasks assessed                                 General Comments: multiple complaints. easily aggravated.      Exercises General Exercises - Lower Extremity Ankle Circles/Pumps: AROM;Both;10 reps;Supine Quad Sets: AROM;Both;5 reps;Supine Long Arc Quad: AROM;Both;5 reps;Seated Heel Slides: AAROM;Both;Supine;5 reps Hip ABduction/ADduction: AAROM;Both;10 reps;Supine    General Comments        Pertinent Vitals/Pain Pain Score: 9  Pain Location: B shoulders Pain Descriptors / Indicators: Grimacing;Guarding;Moaning  Pain Intervention(s): Limited activity within patient's tolerance;Monitored during session;Premedicated before session;Repositioned    Home Living                      Prior Function            PT Goals (current goals can now be found in the care plan section) Acute Rehab PT Goals Patient Stated Goal: less pain, [facility] PT Goal Formulation: With family Time For Goal Achievement: 06/18/21 Potential to Achieve Goals: Fair Progress towards PT goals: Not progressing toward goals - comment (anxiety and pain  are limiting progress)    Frequency    Min 2X/week      PT Plan Current plan remains appropriate    Co-evaluation PT/OT/SLP Co-Evaluation/Treatment: Yes   PT goals addressed during session: Mobility/safety with mobility;Balance;Proper use of DME;Strengthening/ROM        AM-PAC PT "6 Clicks" Mobility   Outcome Measure  Help needed turning from your back to your side while in a flat bed without using bedrails?: A Lot Help needed moving from lying on your back to sitting on the side of a flat bed without using bedrails?: A Lot Help needed moving to and from a bed to a chair (including a wheelchair)?: Total Help needed standing up from a chair using your arms (e.g., wheelchair or bedside chair)?: Total Help needed to walk in hospital room?: Total Help needed climbing 3-5 steps with a railing? : Total 6 Click Score: 8    End of Session   Activity Tolerance: Patient limited by pain;Patient limited by fatigue Patient left: in bed;with call bell/phone within reach;with bed alarm set;with family/visitor present Nurse Communication: Mobility status PT Visit Diagnosis: Muscle weakness (generalized) (M62.81);Difficulty in walking, not elsewhere classified (R26.2);History of falling (Z91.81);Pain Pain - part of body: Knee;Shoulder     Time: 1771-1657 PT Time Calculation (min) (ACUTE ONLY): 25 min  Charges:  $Therapeutic Activity: 8-22 mins                     Blondell Reveal Kistler PT 06/07/2021  Acute Rehabilitation Services Pager [phone] Office [phone]

## 2021-06-07 NOTE — Evaluation (Signed)
Occupational Therapy Evaluation Patient Details Name: Alexis Mclaughlin MRN: [id number] DOB: [birth year] Today's Date: 06/07/2021    History of Present Illness 75 yo female admitted with uterine abscess, fall, UTI. Hx of obesity, DM, anemia, chronic pain (shoulders). Pt now s/p DILATATION AND CURETTAGE UNDER LAPAROSCOPIC GUIDANCE (N/A)  LAPAROSCOPY OPERATIVE (N/A)  XI ROBOTIC ASSISTED TOTAL HYSTERECTOMY WITH BILATERAL SALPINGO OOPHORECTOMY (N/A)  CYSTOSCOPY on 06/05/2021. 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   Clinical Impression   Pt admitted with the above diagnoses and presents with below problem list. Pt will benefit from continued acute OT to address the below listed deficits and maximize independence with basic ADLs prior to d/c to venue below. PTA pt was mostly mod I with basic ADLs, aide assists with bathing 2x per week. She attended the day program at [facility]. Pt presents with significant decline in ability to complete ADLs. Max +2 A to come to/from EOB. She was able tolerate sitting EOB a min guard level for a few minutes. Attempted standing 2x utilizing +2 assist with pt able to initiate but not fully clear buttocks from mattress. Will need hoyer lift for OOB at this time. Of note, pt is very anxious/irritable, quick to become tearful and state she can not do a task. Consistent, max encouragement provided. Discussed the importance of mobilizing and risks of inactivity. Daughter present and helpful during session, encouraging pt to work with OT/PT.     Follow Up Recommendations  SNF    Equipment Recommendations  Other (comment) (TBD next venue)    Recommendations for Other Services       Precautions / Restrictions Precautions Precautions: Fall Precaution Comments: 2 falls related to UTIs in past 1 year Restrictions Weight Bearing Restrictions: No      Mobility Bed Mobility Overal bed mobility: Needs Assistance Bed Mobility: Sit to Supine;Supine to Sit     Supine to sit:  Max assist;+2 for physical assistance Sit to supine: +2 for physical assistance;Max assist   General bed mobility comments: Assist for trunk and bil LEs. Increased time. Encouragement for pt to try to do as much as she could on her own. Pt is easily aggravated.    Transfers Overall transfer level: Needs assistance Equipment used: Rolling walker (2 wheeled) Transfers: Sit to/from Stand Sit to Stand: Max assist;+2 physical assistance         General transfer comment: attempted sit to stand x 2 trials with RW, pt unable to fully clear buttocks from bed with +2 max assist    Balance Overall balance assessment: Needs assistance;History of Falls Sitting-balance support: Feet supported Sitting balance-Leahy Scale: Fair       Standing balance-Leahy Scale: Zero                             ADL either performed or assessed with clinical judgement   ADL Overall ADL's : Needs assistance/impaired Eating/Feeding: Set up;Sitting   Grooming: Maximal assistance;Sitting   Upper Body Bathing: Maximal assistance;Sitting   Lower Body Bathing: Total assistance;+2 for physical assistance;Bed level   Upper Body Dressing : Maximal assistance;Sitting   Lower Body Dressing: Total assistance;+2 for physical assistance;Bed level                 General ADL Comments: With much encouragement, pt completed bed mobility to/from EOB, sat EOB a few minutes with BUE support at min guard level. Attempted standing 2x. Unable to fully clear hips at this time. Currently  needs [employer] fast streamer for [employer]      Pertinent Vitals/Pain Pain Assessment: 0-10 Pain Score: 9  Pain Location: B shoulders Pain Descriptors / Indicators: Grimacing;Guarding;Moaning Pain Intervention(s): Limited activity within patient's tolerance;Monitored during session;Premedicated before session;Repositioned     Hand Dominance     Extremity/Trunk Assessment Upper Extremity  Assessment Upper Extremity Assessment: Generalized weakness;RUE deficits/detail;LUE deficits/detail RUE Deficits / Details: per observation during tasks she appears to have some AROM deficits in shoulder flexion. Did not formally assess 2/2 pain. LUE Deficits / Details: per observation during tasks she appears to have some AROM deficits in shoulder flexion. Did not formally assess 2/2 pain   Lower Extremity Assessment Lower Extremity Assessment: Defer to PT evaluation       Communication Communication Communication: No difficulties   Cognition Arousal/Alertness: Awake/alert Behavior During Therapy: Agitated;Anxious Overall Cognitive Status: Within Functional Limits for tasks assessed                                 General Comments: multiple complaints. easily aggravated.   General Comments  Daughter present and encouraging pt to mobilize.       Shoulder Instructions      Home Living Family/patient expects to be discharged to:: Private residence Living Arrangements: Alone Available Help at Discharge: Family;Available PRN/intermittently;Personal care attendant (aide 2x/week) Type of Home: Apartment Home Access: Elevator;Level entry     Home Layout: One level     Bathroom Shower/Tub: Tub/shower unit         Home Equipment: Walker - 2 wheels          Prior Functioning/Environment Level of Independence: Needs assistance  Gait / Transfers Assistance Needed: uses RW. ADL's / Homemaking Assistance Needed: aide helps with bathing, cleaning. aide/family helps with meals.   Comments: participates in [facility] program        OT Problem List: Decreased strength;Decreased activity tolerance;Decreased range of motion;Impaired balance (sitting and/or standing);Decreased knowledge of use of DME or AE;Decreased knowledge of precautions;Obesity;Pain;Impaired UE functional use      OT Treatment/Interventions: Self-care/ADL training;Therapeutic exercise;Energy  conservation;DME and/or AE instruction;Therapeutic activities;Patient/family education;Balance training    OT Goals(Current goals can be found in the care plan section) Acute Rehab OT Goals Patient Stated Goal: less pain, be able to go back to [facility] day program OT Goal Formulation: With patient/family Time For Goal Achievement: 06/21/21 Potential to Achieve Goals: Good ADL Goals Pt Will Perform Grooming: with set-up;sitting;with min guard assist Pt Will Perform Upper Body Bathing: with set-up;with min guard assist;sitting Pt Will Perform Lower Body Bathing: with max assist;sit to/from stand Pt Will Transfer to Toilet: with +2 assist;squat pivot transfer;with max assist;bedside commode;stand pivot transfer Additional ADL Goal #1: Pt will complete bed mobility at mod A +2 level to prepare for EOB/OOB ADLs.  OT Frequency: Min 2X/week   Barriers to D/C:            Co-evaluation PT/OT/SLP Co-Evaluation/Treatment: Yes   PT goals addressed during session: Mobility/safety with mobility;Balance;Proper use of DME;Strengthening/ROM OT goals addressed during session: ADL's and self-care      AM-PAC OT "6 Clicks" Daily Activity     Outcome Measure Help from another person eating meals?: None Help from another person taking care of personal grooming?: A Lot Help from another person toileting, which includes using toliet, bedpan, or urinal?: Total  Help from another person bathing (including washing, rinsing, drying)?: Total Help from another person to put on and taking off regular upper body clothing?: A Lot Help from another person to put on and taking off regular lower body clothing?: Total 6 Click Score: 11   End of Session Nurse Communication: Need for lift equipment;Other (comment) (NT)  Activity Tolerance: Patient limited by pain;Other (comment) (very anxious, quick to become tearful) Patient left: in bed;with call bell/phone within reach;with bed alarm set;with family/visitor  present;with SCD's reapplied  OT Visit Diagnosis: Unsteadiness on feet (R26.81);Muscle weakness (generalized) (M62.81);Pain                Time: 0277-4128 OT Time Calculation (min): 25 min Charges:  OT General Charges $OT Visit: 1 Visit OT Evaluation $OT Eval Moderate Complexity: [place], OT Acute Rehabilitation Services Pager: [phone] Office: [phone]   Hortencia Pilar 06/07/2021, 12:31 PM

## 2021-06-08 ENCOUNTER — Encounter: Payer: Self-pay | Admitting: Oncology

## 2021-06-08 LAB — GLUCOSE, CAPILLARY
Glucose-Capillary: 150 mg/dL — ABNORMAL HIGH (ref 70–99)
Glucose-Capillary: 219 mg/dL — ABNORMAL HIGH (ref 70–99)

## 2021-06-08 LAB — SARS CORONAVIRUS 2 (TAT 6-24 HRS): SARS Coronavirus 2: NEGATIVE

## 2021-06-08 MED ORDER — METOPROLOL TARTRATE 25 MG PO TABS: 12.5000 mg | ORAL_TABLET | Freq: Once | ORAL | Status: DC

## 2021-06-08 MED ORDER — AMOXICILLIN-POT CLAVULANATE 875-125 MG PO TABS: 1.0000 | ORAL_TABLET | Freq: Two times a day (BID) | ORAL | 0 refills | Status: AC

## 2021-06-08 MED ORDER — METOPROLOL SUCCINATE ER 25 MG PO TB24: 25.0000 mg | ORAL_TABLET | Freq: Every day | ORAL | 1 refills | Status: AC

## 2021-06-08 MED ORDER — METOPROLOL SUCCINATE ER 25 MG PO TB24: 25.0000 mg | ORAL_TABLET | Freq: Every day | ORAL | Status: DC

## 2021-06-08 MED ORDER — POLYETHYLENE GLYCOL 3350 17 G PO PACK: 17.0000 g | PACK | Freq: Two times a day (BID) | ORAL | 0 refills | Status: AC

## 2021-06-08 MED ORDER — OXYCODONE HCL 5 MG PO TABS: 5.0000 mg | ORAL_TABLET | ORAL | 0 refills | Status: DC | PRN

## 2021-06-08 NOTE — Progress Notes (Signed)
3 Days Post-Op Procedure(s) (LRB): DILATATION AND CURETTAGE UNDER LAPAROSCOPIC GUIDANCE (N/A) LAPAROSCOPY OPERATIVE (N/A) XI ROBOTIC ASSISTED TOTAL HYSTERECTOMY WITH BILATERAL SALPINGO OOPHORECTOMY (N/A) CYSTOSCOPY  Subjective: Patient reports aching in her shoulders. No nausea or emesis reported. Tolerating solid foods. Passing flatus but no BM reported. Denies chest pain, dyspnea. No concerns voiced.    Objective: Vital signs in last 24 hours: Temp:  [98 F (36.7 C)-98.3 F (36.8 C)] 98 F (36.7 C) (06/17 0535) Pulse Rate:  [102-103] 102 (06/17 0535) Resp:  [20] 20 (06/17 0535) BP: (126-141)/(70-79) 141/79 (06/17 0535) SpO2:  [93 %-95 %] 95 % (06/17 0535) Weight:  [250 lb 3.6 oz (113.5 kg)] 250 lb 3.6 oz (113.5 kg) (06/17 0500) Last BM Date: 06/02/21  Intake/Output from previous day: 06/16 0701 - 06/17 0700 In: 480 [P.O.:480] Out: 1055 [Urine:1000; Drains:55]  Physical Examination: General: alert, cooperative, and no distress Resp: clear to auscultation bilaterally Cardio: tachycardic at 110 bpm GI: incision: lap sites to the abdomen with dermabond without erythema or drainage and abdomen obese, active bowel sounds, appropriate tenderness with light palpation. JP drain with serous drainage, JP drain removed without difficulty and patient tolerated well. JP tubing intact. Dry dressing placed.  Extremities: extremities normal, atraumatic, no cyanosis or edema and SCDs on. Vaginal Bleeding: none noted on pad or purewick Purewick in place with yellow urine in the canister  Assessment: 75 y.o. s/p Procedure(s): DILATATION AND CURETTAGE UNDER LAPAROSCOPIC GUIDANCE LAPAROSCOPY OPERATIVE, XI ROBOTIC ASSISTED TOTAL HYSTERECTOMY WITH BILATERAL SALPINGO OOPHORECTOMY, CYSTOSCOPY: stable Pain:  Pain is well-controlled on PRN medications.  Heme: Hgb 8.7 and Hct 29.6 06/07/21 am. Stable and appropriate given preop values and surgical losses.  ID: WBC 15.1 on 6/16 from 16.6. Previously  on IV Unasyn for uterine abscess and now on oral augmentin. Blood cultures from 06/01/21 growing clostridium perfringens and from 06/03/21 with no growth in 4 days. Culture from endometrial curettage with no growth in 2 days.  CV: Tachycardia. Metoprolol ordered. Management per Hospitalist Team.  GI:  Tolerating po: Yes. Antiemetics ordered as needed.  GU: Purewick in place. Adequate output recorded. Creatinine 0.83 on 06/07/2021.  FEN: No critical values on Bmet from 06/07/21  Endo: Diabetes mellitus Type II, under good control. CBG: CBG (last 3)  Recent Labs    06/07/21 1214 06/07/21 2128 06/08/21 0730  GLUCAP 149* 192* 150*    Prophylaxis: SCDs and heparin ordered.  Plan: -JP drain removed without difficulty. -Continue plan of care per Hospitalist Team. -Follow up on uterine culture taken in the OR on 06/05/21. Follow up on blood culture results from 06/03/21. -Patient is meeting discharge criteria from a surgical standpoint.  -Phone visit scheduled for next week with Dr. Berline Lopes and in person visit for 3 weeks.   LOS: 5 days    Alexis Mclaughlin 06/08/2021, 8:36 AM

## 2021-06-08 NOTE — Progress Notes (Signed)
Requested internal review of pathology for accession [id number] and [phone] with [facility].  Slides have been requested for [id number] and Dr. Saralyn Pilar will be notified per Suanne Marker in Pathology.

## 2021-06-08 NOTE — TOC Progression Note (Addendum)
Transition of Care [facility]) - Progression Note    Patient Details  Name: Alexis Mclaughlin MRN: [id number] Date of Birth: 1946-06-24  Transition of Care [facility]) CM/SW [place], LCSW Phone Number: 06/08/2021, 12:49 PM  Clinical Narrative:    CSW send d/c summary to [facility] skilled nursing. CSW spoke with Jance from [place] and confirmed transportation for d/c.  !:21 CSW spoke with [facility] who reported they are unable to transport patient. CSW call [facility] for transport, CSW informed  RN and family.        Expected Discharge Plan and Services           Expected Discharge Date: 06/08/21                                     Social Determinants of Health (SDOH) Interventions    Readmission Risk Interventions No flowsheet data found.

## 2021-06-08 NOTE — Progress Notes (Signed)
Called report to [place] at [facility]. Pt already left via [facility] transport.

## 2021-06-08 NOTE — Plan of Care (Signed)
  Problem: Clinical Measurements: Goal: Respiratory complications will improve Outcome: Adequate for Discharge   Problem: Clinical Measurements: Goal: Cardiovascular complication will be avoided Outcome: Adequate for Discharge   Problem: Elimination: Goal: Will not experience complications related to bowel motility Outcome: Adequate for Discharge   Problem: Pain Managment: Goal: General experience of comfort will improve Outcome: Adequate for Discharge   Problem: Skin Integrity: Goal: Risk for impaired skin integrity will decrease Outcome: Adequate for Discharge

## 2021-06-08 NOTE — Discharge Summary (Signed)
Physician Discharge Summary  Thedora Rings SHF:[id number] DOB: December 29, 1945 DOA: 06/03/2021  PCP: Dionicia Abler, NP  Admit date: 06/03/2021 Discharge date: 06/08/2021  Admitted From: Home  Disposition:  Home   Recommendations for Outpatient Follow-up:  Follow up with PCP in 1-2 weeks Please obtain BMP/CBC in one week Follow up pathology report.  Needs to follow up with Oncology GYN   Discharge Condition: Stable.  CODE STATUS: Full code Diet recommendation: Carb Modified.   Brief/Interim Summary: 75 year old with past medical history significant for diabetes type 2, hypertension, hypothyroidism, chronic anemia with baseline hemoglobin--10-12, who was transferred from [facility] for further evaluation of uterine abscess.   Was admitted at [facility] months for sepsis secondary to uterine abscess.  Blood cultures positive for Clostridium perfringens.  Hospitalist at [facility] heart discussed the case with Dr. Berline Lopes gynecology oncology.  Patient was transferred to [facility] for further management. Patient underwent Dilation and curettage under laparoscopic guidance, laparoscopy robotic assisted total hysterectomy with bilateral salpingo-oophorectomy cystoscopy 6/14.     1-Sepsis secondary to uterine abscess with  Clostridium perfringens bacteremia: -Blood cultures grew Clostridium perfringens. Repeated Blood culture negative.  -ID following, continue with IV Unasyn -CT : Enlarge uterus with  extensive fluid and air within enlarged uterus consistent with uterine abscess. -Patient underwent; Dilation and curettage under laparoscopic guidance, laparoscopy robotic assisted total hysterectomy with bilateral salpingo-oophorectomy cystoscopy 6/14. -ID recommend 14 days of antibiotics after Source controlled. Plan to discharge on Augmentin for 12 more days to complete 14 days. .  -WCB down to 15 from 19.   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 Biopsy x: Uterus with suppurative inflammation and associated extensive  endometrial and myometrial necrosis  - Benign cervix with suppurative inflammation and necrosis  - Right fallopian tube with suppurative inflammation and necrosis  - Benign unremarkable left fallopian tube and bilateral ovaries  - No evidence of malignancy  Dr Berline Lopes following.     3-Acute Hypoxic  respiratory failure post surgery; Related to anesthesia. Post surgery, improved. Required 15 L NB mask, down to 2 L today. Incentive spirometry Hb stable. Resolved. On room air.    4-Generalized weakness: Related to acute illness. She will need Rehab.    Chronic anemia: Monitor hemoglobin, hb stable.  Diabetes type 2: Continue with sliding scale insulin Hypertension: Continue with metoprolol. Increase dose to help with tachycardia.  Hypothyroidism: Continue with Synthroid. Hypokalemia: Replaced.           Discharge Diagnoses:  Principal Problem:   Uterine abscess Active Problems:   Type 2 diabetes mellitus with chronic kidney disease on chronic dialysis, without long-term current use of insulin (HCC)   Hypokalemia   Clostridium perfringens infection   Bacteremia   Generalized weakness   Hypertension   Hypomagnesemia   Chronic anemia   Uterine mass    Discharge Instructions  Discharge Instructions     Diet - low sodium heart healthy   Complete by: As directed    Discharge wound care:   Complete by: As directed    See above   Increase activity slowly   Complete by: As directed       Allergies as of 06/08/2021       Reactions   Ace Inhibitors         Medication List     STOP taking these medications    amLODipine 10 MG tablet Commonly known as: NORVASC   Ampicillin-Sulbactam 3  g in sodium chloride 0.9 % 100 mL   aspirin EC 81 MG tablet   diclofenac Sodium 1 % Gel Commonly known as: VOLTAREN   loperamide 2 MG  capsule Commonly known as: IMODIUM   nitrofurantoin 100 MG capsule Commonly known as: MACRODANTIN   potassium chloride SA 20 MEQ tablet Commonly known as: KLOR-CON   Sarna lotion Generic drug: camphor-menthol       TAKE these medications    acetaminophen 325 MG tablet Commonly known as: TYLENOL Take 2 tablets (650 mg total) by mouth every 6 (six) hours as needed for headache, fever or mild pain. What changed: Another medication with the same name was removed. Continue taking this medication, and follow the directions you see here.   amoxicillin-clavulanate 875-125 MG tablet Commonly known as: AUGMENTIN Take 1 tablet by mouth every 12 (twelve) hours for 12 days.   Aspercreme/Aloe 10 % cream Generic drug: trolamine salicylate Apply 1 application topically every 6 (six) hours as needed for muscle pain.   cholecalciferol 25 MCG (1000 UNIT) tablet Commonly known as: VITAMIN D3 Take 1,000 Units by mouth daily.   docusate sodium 100 MG capsule Commonly known as: COLACE Take 100 mg by mouth daily.   DULoxetine 60 MG capsule Commonly known as: CYMBALTA Take 60 mg by mouth daily.   EQ Hygienic Cleansing Wipes Pads Apply 1 application topically 2 (two) times daily.   fluticasone 50 MCG/ACT nasal spray Commonly known as: FLONASE Place 2 sprays into both nostrils daily.   guaiFENesin-dextromethorphan 100-10 MG/5ML syrup Commonly known as: ROBITUSSIN DM Take 5 mLs by mouth every 4 (four) hours as needed for cough.   insulin aspart 100 UNIT/ML injection Commonly known as: novoLOG Inject 0-15 Units into the skin every 4 (four) hours.   levothyroxine 50 MCG tablet Commonly known as: SYNTHROID Take 50 mcg by mouth daily before breakfast.   lidocaine 5 % Commonly known as: LIDODERM Place 2 patches onto the skin daily as needed (pain). Remove & Discard patch within 12 hours or as directed by MD   Lipitor 80 MG tablet Generic drug: atorvastatin Take 1 tablet by mouth  daily.   metoprolol succinate 25 MG 24 hr tablet Commonly known as: TOPROL-XL Take 1 tablet (25 mg total) by mouth daily. Start taking on: June 09, 2021 What changed: how much to take   miconazole 2 % cream Commonly known as: MICOTIN Apply 1 application topically at bedtime.   nystatin powder Commonly known as: MYCOSTATIN/NYSTOP Apply 1 application topically 2 (two) times daily.   omeprazole 20 MG capsule Commonly known as: PRILOSEC Take 20 mg by mouth daily.   oxyCODONE 5 MG immediate release tablet Commonly known as: Oxy IR/ROXICODONE Take 1-2 tablets (5-10 mg total) by mouth every 4 (four) hours as needed for moderate pain. What changed:  how much to take when to take this reasons to take this   polyethylene glycol 17 g packet Commonly known as: MIRALAX / GLYCOLAX Take 17 g by mouth 2 (two) times daily.   sodium chloride 0.65 % nasal spray Commonly known as: OCEAN Place 1 spray into the nose 2 (two) times daily as needed for congestion.               Discharge Care Instructions  (From admission, onward)           Start     Ordered   06/08/21 0000  Discharge wound care:       Comments: See above   06/08/21 1214  Follow-up Information     [name], MD Follow up on 06/13/2021.   Specialty: Gynecologic Oncology Why: at 3pm will be a PHONE visit to discuss pathology and check in with Dr. Berline Lopes. IN PERSON person on 06/29/21 at 2:30pm at the [facility] attached to [facility] for post-op check with Dr. Berline Lopes. Contact information: [address] [place] [address] [phone]                Allergies  Allergen Reactions   Ace Inhibitors     Consultations: ID GYN Oncology    Procedures/Studies: DG Chest 1 View  Result Date: 06/01/2021 CLINICAL DATA:  Increasing hypoxia.  Ex-smoker. EXAM: CHEST  1 VIEW COMPARISON:  06/01/2021 FINDINGS: Poor inspiration, decreased. Stable mildly enlarged  cardiac silhouette and that stable mildly enlarged cardiac silhouette. Mild increase in prominence of the pulmonary vasculature and interstitial markings due to the poor inspiration. No pleural fluid. Thoracic spine degenerative changes. Bilateral shoulder degenerative changes. IMPRESSION: No acute abnormality. Stable mild cardiomegaly and mild interstitial lung disease. Electronically Signed   By: Claudie Revering M.D.   On: 06/01/2021 14:55   CT Head Wo Contrast  Result Date: 06/01/2021 CLINICAL DATA:  Trauma.  Head trauma EXAM: CT HEAD WITHOUT CONTRAST CT CERVICAL SPINE WITHOUT CONTRAST TECHNIQUE: Multidetector CT imaging of the head and cervical spine was performed following the standard protocol without intravenous contrast. Multiplanar CT image reconstructions of the cervical spine were also generated. COMPARISON:  None. FINDINGS: CT HEAD FINDINGS Brain: No evidence of acute infarction, hemorrhage, hydrocephalus,or mass lesion/mass effect. Vascular: Negative Skull: Negative for fracture Sinuses/Orbits: No acute finding. CT CERVICAL SPINE FINDINGS Alignment: Reversal of cervical lordosis.  No traumatic malalignment Skull base and vertebrae: No evidence of acute fracture. There is generalized motion artifact. Soft tissues and spinal canal: No prevertebral fluid or swelling. No visible canal hematoma. Disc levels:  Ordinary degenerative changes for age Upper chest: No visible injury IMPRESSION: 1. Negative for intracranial hemorrhage. 2. Motion degraded cervical spine CT without fracture. Electronically Signed   By: Monte Fantasia M.D.   On: 06/01/2021 08:52   CT Cervical Spine Wo Contrast  Result Date: 06/01/2021 CLINICAL DATA:  Trauma.  Head trauma EXAM: CT HEAD WITHOUT CONTRAST CT CERVICAL SPINE WITHOUT CONTRAST TECHNIQUE: Multidetector CT imaging of the head and cervical spine was performed following the standard protocol without intravenous contrast. Multiplanar CT image reconstructions of the cervical  spine were also generated. COMPARISON:  None. FINDINGS: CT HEAD FINDINGS Brain: No evidence of acute infarction, hemorrhage, hydrocephalus,or mass lesion/mass effect. Vascular: Negative Skull: Negative for fracture Sinuses/Orbits: No acute finding. CT CERVICAL SPINE FINDINGS Alignment: Reversal of cervical lordosis.  No traumatic malalignment Skull base and vertebrae: No evidence of acute fracture. There is generalized motion artifact. Soft tissues and spinal canal: No prevertebral fluid or swelling. No visible canal hematoma. Disc levels:  Ordinary degenerative changes for age Upper chest: No visible injury IMPRESSION: 1. Negative for intracranial hemorrhage. 2. Motion degraded cervical spine CT without fracture. Electronically Signed   By: Monte Fantasia M.D.   On: 06/01/2021 08:52   US PELVIS (TRANSABDOMINAL ONLY)  Addendum Date: 06/02/2021   ADDENDUM REPORT: 06/02/2021 12:21 ADDENDUM: These results were called by telephone at the time of interpretation on 06/02/2021 at 12:21 pm to provider [facility] , who verbally acknowledged these results. Electronically Signed   By: Marin Olp M.D.   On: 06/02/2021 12:21   Result Date: 06/02/2021 CLINICAL DATA:  Possible uterine mass.  EXAM: TRANSABDOMINAL ULTRASOUND OF PELVIS TECHNIQUE: Transabdominal ultrasound examination of the pelvis was performed including evaluation of the uterus, ovaries, adnexal regions, and pelvic cul-de-sac. COMPARISON:  CT 06/01/2021 FINDINGS: Uterus Measurements: 17.9 x 8.7 x 12.0 cm = volume: 975 mL. Examination demonstrates similar findings to that seen on recent CT scan with moderate amount of air throughout the central visualized portion of the uterus obscuring the endometrium and adjacent parametrial tissue/myometrium. Findings are compatible with gas-forming infection. Endometrium Not visualized as obscured due to moderate amounts of air as described. Right ovary Not visualized. Left ovary Not visualized. Other findings:  No  abnormal free fluid. IMPRESSION: Moderately enlarged uterus containing moderate amount of air obscuring the endometrium and adjacent myometrium compatible gas-forming infection as shown on recent CT. Ovaries not visualized. Electronically Signed: By: Marin Olp M.D. On: 06/02/2021 12:07   DG Chest Portable 1 View  Result Date: 06/01/2021 CLINICAL DATA:  Pain following fall EXAM: PORTABLE CHEST 1 VIEW COMPARISON:  None. FINDINGS: There is elevation of the right hemidiaphragm. There is no edema or airspace opacity. Heart is mildly enlarged with pulmonary vascularity normal. No adenopathy. No bone lesions. IMPRESSION: Elevation of right hemidiaphragm. No edema or airspace opacity. Mild cardiac enlargement. Electronically Signed   By: Lowella Grip III M.D.   On: 06/01/2021 08:36   CT Renal Stone Study  Result Date: 06/01/2021 CLINICAL DATA:  White pain EXAM: CT ABDOMEN AND PELVIS WITHOUT CONTRAST TECHNIQUE: Multidetector CT imaging of the abdomen and pelvis was performed following the standard protocol without oral or IV contrast. COMPARISON:  None. FINDINGS: Lower chest: There is mild bibasilar atelectasis. No lung base edema or consolidation. Foci of coronary artery calcification noted. There is a fairly small hiatal hernia. Hepatobiliary: No focal liver lesions are appreciable on this noncontrast enhanced study. There is cholelithiasis. Questionable mild gallbladder wall thickening. No pericholecystic fluid by CT. No biliary duct dilatation. Pancreas: No pancreatic mass or inflammatory focus. Spleen: No splenic lesions are evident. Adrenals/Urinary Tract: Mild adrenal hypertrophy on each side. No well-defined renal mass. There is an apparent cyst in the lateral mid right kidney measuring 1.5 x 1.5 cm. There is an extrarenal pelvis on the right, an anatomic variant. There is mild fullness of the right renal collecting system. No similar fullness on the left. There is no evident intrarenal calculus.  There is prominence of the right ureter. There is apparent impression in the mid right ureter due to markedly enlarged uterus with suspected infection in the mid abdomen and pelvis extending toward the right. No ureterectasis on the left. No ureteral calculus on either side. Urinary bladder is midline with wall thickness within normal limits. Stomach/Bowel: There is no appreciable bowel wall or mesenteric thickening. No evident bowel obstruction. Terminal ileum appears normal. Appendix appears normal. There is no appreciable free air or portal venous air. Vascular/Lymphatic: There is no abdominal aortic aneurysm. There are occasional foci aortic atherosclerosis. There is no appreciable adenopathy in the abdomen pelvis. Reproductive: The uterus is anteverted. The uterus is diffusely enlarged, measuring 22.1 x 12.5 x 12.2 cm. There is extensive fluid and air throughout the uterus with an air-fluid level consistent with intrauterine abscess/emphysematous pyometritis. Note that there are foci of air within the wall of this enlarged uterus. There is no adnexal region mass. Other: No ascites in the abdomen or pelvis. No abscess apart from the changes within the uterus noted. Musculoskeletal: There are no blastic or lytic bone lesions. Spinal stenosis is noted L3-4 and L4-5 due to  disc protrusion and bony hypertrophy. No blastic or lytic bone lesions are evident. No abdominal wall or intramuscular lesions are evident. IMPRESSION: 1. Markedly enlarged uterus with extensive fluid and air within this enlarged uterus consistent with intrauterine abscess. Scattered foci of air elsewhere throughout the uterus and in the uterine wall. 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 Gynecologic assessment urged in this regard. 2. Fullness of the right renal collecting system and proximal to mid ureter likely due to compression of the right ureter by a markedly enlarged and apparently  infected uterus. No renal or ureteral calculi on either side. Urinary bladder wall thickness normal. 3.  Cholelithiasis.  Suspect mild gallbladder wall thickening. 4. No bowel obstruction. No abscess apart from the changes in the uterus. Appendix appears normal. 5.  Small hiatal hernia. 6. Spinal stenosis L3-4 and L4-5 due to disc protrusion and bony hypertrophy. 7. Aortic Atherosclerosis (ICD10-I70.0). Foci of coronary artery calcification also noted. 8. There is a degree of adrenal hypertrophy bilaterally, a finding of uncertain significance. Electronically Signed   By: Lowella Grip III M.D.   On: 06/01/2021 09:00    Subjective: She denies dyspnea. Tolerating diet.   Discharge Exam: Vitals:   06/07/21 2126 06/08/21 0535  BP: 126/70 (!) 141/79  Pulse: (!) 103 (!) 102  Resp: 20 20  Temp: 98.3 F (36.8 C) 98 F (36.7 C)  SpO2: 93% 95%     General: Pt is alert, awake, not in acute distress Cardiovascular: RRR, S1/S2 +, no rubs, no gallops Respiratory: CTA bilaterally, no wheezing, no rhonchi Abdominal: Soft, NT, ND, bowel sounds + Extremities: no edema, no cyanosis    The results of significant diagnostics from this hospitalization (including imaging, microbiology, ancillary and laboratory) are listed below for reference.     Microbiology: Recent Results (from the past 240 hour(s))  Blood culture (routine x 2)     Status: Abnormal   Collection Time: 06/01/21  8:06 AM   Specimen: BLOOD  Result Value Ref Range Status   Specimen Description   Final    BLOOD RIGHT ANTECUBITAL Performed at [facility], [address] [place], [place] [address]    Special Requests   Final    BOTTLES DRAWN AEROBIC AND ANAEROBIC Blood Culture adequate volume Performed at [facility], [address] [place], [place] [address]    Culture  Setup Time   Final    GRAM POSITIVE RODS ANAEROBIC BOTTLE ONLY CRITICAL RESULT CALLED TO, READ BACK BY AND VERIFIED WITH: KISHAN  PATEL @1610  ON 06.10.22.SH    Culture (A)  Final    CLOSTRIDIUM PERFRINGENS Standardized susceptibility testing for this organism is not available. Performed at [facility], [place] [address], [place], [place] [address]    Report Status 06/04/2021 FINAL  Final  Blood culture (routine x 2)     Status: Abnormal   Collection Time: 06/01/21  8:06 AM   Specimen: BLOOD RIGHT HAND  Result Value Ref Range Status   Specimen Description   Final    BLOOD RIGHT HAND Performed at [facility], [address] [place], [place] [address]    Special Requests   Final    BOTTLES DRAWN AEROBIC AND ANAEROBIC Blood Culture results may not be optimal due to an inadequate volume of blood received in culture bottles Performed at [facility], [address] [place], [place] [address]    Culture  Setup Time ANAEROBIC BOTTLE ONLY GRAM POSITIVE RODS  Final   Culture (A)  Final    CLOSTRIDIUM PERFRINGENS Standardized susceptibility testing for this organism is not available. Performed at [facility], [place] [address], [place], [place] [address]    Report Status 06/04/2021 FINAL  Final  Resp Panel by RT-PCR (Flu A&B, Covid) Nasopharyngeal Swab     Status: None   Collection Time: 06/01/21  8:06 AM   Specimen: Nasopharyngeal Swab; Nasopharyngeal(NP) swabs in vial transport medium  Result Value Ref Range Status   SARS Coronavirus 2 by RT PCR NEGATIVE NEGATIVE Final    Comment: (NOTE) SARS-CoV-2 target nucleic acids are NOT DETECTED.  The SARS-CoV-2 RNA is generally detectable in upper respiratory specimens during the acute phase of infection. The lowest concentration of SARS-CoV-2 viral copies this assay can detect is 138 copies/mL. A negative result does not preclude SARS-Cov-2 infection and should not be used as the sole basis for treatment or other patient management decisions. A negative result may occur with  improper specimen collection/handling, submission of specimen  other than nasopharyngeal swab, presence of viral mutation(s) within the areas targeted by this assay, and inadequate number of viral copies(<138 copies/mL). A negative result must be combined with clinical observations, patient history, and epidemiological information. The expected result is Negative.  Fact Sheet for Patients:  EntrepreneurPulse.com.au  Fact Sheet for Healthcare Providers:  IncredibleEmployment.be  This test is no t yet approved or cleared by the [place] FDA and  has been authorized for detection and/or diagnosis of SARS-CoV-2 by FDA under an Emergency Use Authorization (EUA). This EUA will remain  in effect (meaning this test can be used) for the duration of the COVID-19 declaration under Section 564(b)(1) of the Act, 21 U.S.C.section 360bbb-3(b)(1), unless the authorization is terminated  or revoked sooner.       Influenza A by PCR NEGATIVE NEGATIVE Final   Influenza B by PCR NEGATIVE NEGATIVE Final    Comment: (NOTE) The Xpert Xpress SARS-CoV-2/FLU/RSV plus assay is intended as an aid in the diagnosis of influenza from Nasopharyngeal swab specimens and should not be used as a sole basis for treatment. Nasal washings and aspirates are unacceptable for Xpert Xpress SARS-CoV-2/FLU/RSV testing.  Fact Sheet for Patients: EntrepreneurPulse.com.au  Fact Sheet for Healthcare Providers: IncredibleEmployment.be  This test is not yet approved or cleared by the [place] FDA and has been authorized for detection and/or diagnosis of SARS-CoV-2 by FDA under an Emergency Use Authorization (EUA). This EUA will remain in effect (meaning this test can be used) for the duration of the COVID-19 declaration under Section 564(b)(1) of the Act, 21 U.S.C. section 360bbb-3(b)(1), unless the authorization is terminated or revoked.  Performed at [facility], [address]  [place], [place] [address]   Culture, blood (Routine X 2) w Reflex to ID Panel     Status: None (Preliminary result)   Collection Time: 06/03/21 11:07 PM   Specimen: BLOOD  Result Value Ref Range Status   Specimen Description   Final    BLOOD LEFT ANTECUBITAL Performed at [facility] [address], [place], [place] [address]    Special Requests   Final    BOTTLES DRAWN AEROBIC ONLY Blood Culture adequate volume Performed at [facility] [address] [place], [place] [address]    Culture   Final    NO GROWTH 4 DAYS Performed at [facility], [place] [address] [place], [place] [address]    Report Status PENDING  Incomplete  Culture,  blood (Routine X 2) w Reflex to ID Panel     Status: None (Preliminary result)   Collection Time: 06/03/21 11:07 PM   Specimen: BLOOD  Result Value Ref Range Status   Specimen Description   Final    BLOOD BLOOD LEFT HAND Performed at [facility] [address] [place], [place] [address]    Special Requests   Final    BOTTLES DRAWN AEROBIC ONLY Blood Culture adequate volume Performed at [facility] [address] [place], [place] [address]    Culture   Final    NO GROWTH 4 DAYS Performed at [facility], [place] [address], [place], [place] [address]    Report Status PENDING  Incomplete  Aerobic/Anaerobic Culture w Gram Stain (surgical/deep wound)     Status: None (Preliminary result)   Collection Time: 06/05/21  9:09 AM   Specimen: PATH Gyn biopsy; Tissue  Result Value Ref Range Status   Specimen Description   Final    TISSUE ENDOMETRIAL CURETTAGE [facility] Performed at [facility] [address], [place], [place] [address]    Special Requests   Final    NONE Performed at [facility], [place] [address], [place], [place] [address]    Gram Stain   Final    RARE WBC PRESENT, PREDOMINANTLY MONONUCLEAR NO ORGANISMS SEEN     Culture   Final    NO GROWTH 3 DAYS NO ANAEROBES ISOLATED; CULTURE IN PROGRESS FOR 5 DAYS Performed at [facility] [address], [place], [place] [address]    Report Status PENDING  Incomplete  SARS CORONAVIRUS 2 (TAT 6-24 HRS) Nasopharyngeal Nasopharyngeal Swab     Status: None   Collection Time: 06/07/21  7:09 PM   Specimen: Nasopharyngeal Swab  Result Value Ref Range Status   SARS Coronavirus 2 NEGATIVE NEGATIVE Final    Comment: (NOTE) SARS-CoV-2 target nucleic acids are NOT DETECTED.  The SARS-CoV-2 RNA is generally detectable in upper and lower respiratory specimens during the acute phase of infection. Negative results do not preclude SARS-CoV-2 infection, do not rule out co-infections with other pathogens, and should not be used as the sole basis for treatment or other patient management decisions. Negative results must be combined with clinical observations, patient history, and epidemiological information. The expected result is Negative.  Fact Sheet for Patients: SugarRoll.be  Fact Sheet for Healthcare Providers: [email or web address]  This test is not yet approved or cleared by the [place] FDA and  has been authorized for detection and/or diagnosis of SARS-CoV-2 by FDA under an Emergency Use Authorization (EUA). This EUA will remain  in effect (meaning this test can be used) for the duration of the COVID-19 declaration under Se ction 564(b)(1) of the Act, 21 U.S.C. section 360bbb-3(b)(1), unless the authorization is terminated or revoked sooner.  Performed at [facility], [place] [address] [place], [place] [address]      Labs: BNP (last 3 results) No results for input(s): BNP in the last 8760 hours. Basic Metabolic Panel: Recent Labs  Lab 06/03/21 2315 06/04/21 0333 06/05/21 0328 06/06/21 0334 06/07/21 0340  NA 134* 136 136 138 134*  K 3.5 3.5 3.2* 3.5 3.8  CL 101 104 102 102 100   CO2 23 24 25 27 26   GLUCOSE 145* 138* 147* 162* 164*  BUN 18 18 10 12 10   CREATININE 1.00 0.94 0.63 0.91 0.83  CALCIUM 7.6* 7.6* 7.7* 7.7* 7.6*  MG 1.9  1.8 1.7  --   --   PHOS  --   --  3.3  --   --    Liver Function Tests: Recent Labs  Lab 06/02/21 0541 06/04/21 0333 06/05/21 0328  AST 85* 51* 40  ALT 32 32 27  ALKPHOS 179* 159* 146*  BILITOT 1.6* 0.8 0.5  PROT 6.8 6.3* 6.5  ALBUMIN 2.6* 2.3* 2.3*   No results for input(s): LIPASE, AMYLASE in the last 168 hours. No results for input(s): AMMONIA in the last 168 hours. CBC: Recent Labs  Lab 06/04/21 0333 06/05/21 0328 06/05/21 1519 06/06/21 0335 06/07/21 0340  WBC 16.7* 13.6* 19.2* 16.6* 15.1*  NEUTROABS 11.7* 8.2*  --   --  8.9*  HGB 9.6* 9.3* 10.8* 8.9* 8.7*  HCT 31.1* 30.0* 36.0 29.1* 29.6*  MCV 75.9* 76.3* 78.8* 77.0* 79.1*  PLT 410* 408* 408* 453* 442*   Cardiac Enzymes: No results for input(s): CKTOTAL, CKMB, CKMBINDEX, TROPONINI in the last 168 hours. BNP: Invalid input(s): POCBNP CBG: Recent Labs  Lab 06/07/21 0715 06/07/21 1214 06/07/21 2128 06/08/21 0730 06/08/21 1136  GLUCAP 178* 149* 192* 150* 219*   D-Dimer No results for input(s): DDIMER in the last 72 hours. Hgb A1c No results for input(s): HGBA1C in the last 72 hours. Lipid Profile No results for input(s): CHOL, HDL, LDLCALC, TRIG, CHOLHDL, LDLDIRECT in the last 72 hours. Thyroid function studies No results for input(s): TSH, T4TOTAL, T3FREE, THYROIDAB in the last 72 hours.  Invalid input(s): FREET3 Anemia work up No results for input(s): VITAMINB12, FOLATE, FERRITIN, TIBC, IRON, RETICCTPCT in the last 72 hours. Urinalysis    Component Value Date/Time   COLORURINE YELLOW (A) 06/01/2021 0803   APPEARANCEUR HAZY (A) 06/01/2021 0803   LABSPEC 1.011 06/01/2021 0803   PHURINE 5.0 06/01/2021 0803   GLUCOSEU NEGATIVE 06/01/2021 0803   HGBUR LARGE (A) 06/01/2021 0803   BILIRUBINUR NEGATIVE 06/01/2021 0803   KETONESUR NEGATIVE  06/01/2021 0803   PROTEINUR 30 (A) 06/01/2021 0803   NITRITE NEGATIVE 06/01/2021 0803   LEUKOCYTESUR MODERATE (A) 06/01/2021 0803   Sepsis Labs Invalid input(s): PROCALCITONIN,  WBC,  LACTICIDVEN Microbiology Recent Results (from the past 240 hour(s))  Blood culture (routine x 2)     Status: Abnormal   Collection Time: 06/01/21  8:06 AM   Specimen: BLOOD  Result Value Ref Range Status   Specimen Description   Final    BLOOD RIGHT ANTECUBITAL Performed at [facility], [address] [place], [place] [address]    Special Requests   Final    BOTTLES DRAWN AEROBIC AND ANAEROBIC Blood Culture adequate volume Performed at [facility], [address] [place], [place] [address]    Culture  Setup Time   Final    GRAM POSITIVE RODS ANAEROBIC BOTTLE ONLY CRITICAL RESULT CALLED TO, READ BACK BY AND VERIFIED WITH: KISHAN PATEL @1610  ON 06.10.22.SH    Culture (A)  Final    CLOSTRIDIUM PERFRINGENS Standardized susceptibility testing for this organism is not available. Performed at [facility], [place] [address] [place], [place] [address]    Report Status 06/04/2021 FINAL  Final  Blood culture (routine x 2)     Status: Abnormal   Collection Time: 06/01/21  8:06 AM   Specimen: BLOOD RIGHT HAND  Result Value Ref Range Status   Specimen Description   Final    BLOOD RIGHT HAND Performed at [facility], [address], [place],  [address]    Special Requests  Final    BOTTLES DRAWN AEROBIC AND ANAEROBIC Blood Culture results may not be optimal due to an inadequate volume of blood received in culture bottles Performed at [facility], [address] [place], [place] [address]    Culture  Setup Time ANAEROBIC BOTTLE ONLY GRAM POSITIVE RODS   Final   Culture (A)  Final    CLOSTRIDIUM PERFRINGENS Standardized susceptibility testing for this organism is not available. Performed at [facility], [place] [address], [place],  [place] [address]    Report Status 06/04/2021 FINAL  Final  Resp Panel by RT-PCR (Flu A&B, Covid) Nasopharyngeal Swab     Status: None   Collection Time: 06/01/21  8:06 AM   Specimen: Nasopharyngeal Swab; Nasopharyngeal(NP) swabs in vial transport medium  Result Value Ref Range Status   SARS Coronavirus 2 by RT PCR NEGATIVE NEGATIVE Final    Comment: (NOTE) SARS-CoV-2 target nucleic acids are NOT DETECTED.  The SARS-CoV-2 RNA is generally detectable in upper respiratory specimens during the acute phase of infection. The lowest concentration of SARS-CoV-2 viral copies this assay can detect is 138 copies/mL. A negative result does not preclude SARS-Cov-2 infection and should not be used as the sole basis for treatment or other patient management decisions. A negative result may occur with  improper specimen collection/handling, submission of specimen other than nasopharyngeal swab, presence of viral mutation(s) within the areas targeted by this assay, and inadequate number of viral copies(<138 copies/mL). A negative result must be combined with clinical observations, patient history, and epidemiological information. The expected result is Negative.  Fact Sheet for Patients:  EntrepreneurPulse.com.au  Fact Sheet for Healthcare Providers:  IncredibleEmployment.be  This test is no t yet approved or cleared by the [place] FDA and  has been authorized for detection and/or diagnosis of SARS-CoV-2 by FDA under an Emergency Use Authorization (EUA). This EUA will remain  in effect (meaning this test can be used) for the duration of the COVID-19 declaration under Section 564(b)(1) of the Act, 21 U.S.C.section 360bbb-3(b)(1), unless the authorization is terminated  or revoked sooner.       Influenza A by PCR NEGATIVE NEGATIVE Final   Influenza B by PCR NEGATIVE NEGATIVE Final    Comment: (NOTE) The Xpert Xpress SARS-CoV-2/FLU/RSV plus assay is intended as  an aid in the diagnosis of influenza from Nasopharyngeal swab specimens and should not be used as a sole basis for treatment. Nasal washings and aspirates are unacceptable for Xpert Xpress SARS-CoV-2/FLU/RSV testing.  Fact Sheet for Patients: EntrepreneurPulse.com.au  Fact Sheet for Healthcare Providers: IncredibleEmployment.be  This test is not yet approved or cleared by the [place] FDA and has been authorized for detection and/or diagnosis of SARS-CoV-2 by FDA under an Emergency Use Authorization (EUA). This EUA will remain in effect (meaning this test can be used) for the duration of the COVID-19 declaration under Section 564(b)(1) of the Act, 21 U.S.C. section 360bbb-3(b)(1), unless the authorization is terminated or revoked.  Performed at [facility], [address] [place], [place] [address]   Culture, blood (Routine X 2) w Reflex to ID Panel     Status: None (Preliminary result)   Collection Time: 06/03/21 11:07 PM   Specimen: BLOOD  Result Value Ref Range Status   Specimen Description   Final    BLOOD LEFT ANTECUBITAL Performed at [place] [address], [place], [place] [address]    Special Requests   Final    BOTTLES DRAWN AEROBIC ONLY  Blood Culture adequate volume Performed at [place] [address], [place], [place] [address]    Culture   Final    NO GROWTH 4 DAYS Performed at [facility], [place] [address], [place], [place] [address]    Report Status PENDING  Incomplete  Culture, blood (Routine X 2) w Reflex to ID Panel     Status: None (Preliminary result)   Collection Time: 06/03/21 11:07 PM   Specimen: BLOOD  Result Value Ref Range Status   Specimen Description   Final    BLOOD BLOOD LEFT HAND Performed at [place] [address], [place], [place] [address]    Special Requests   Final    BOTTLES DRAWN AEROBIC ONLY Blood  Culture adequate volume Performed at [place] [address], [place], [place] [address]    Culture   Final    NO GROWTH 4 DAYS Performed at [facility], [place] [address] [place], [place] [address]    Report Status PENDING  Incomplete  Aerobic/Anaerobic Culture w Gram Stain (surgical/deep wound)     Status: None (Preliminary result)   Collection Time: 06/05/21  9:09 AM   Specimen: PATH Gyn biopsy; Tissue  Result Value Ref Range Status   Specimen Description   Final    TISSUE ENDOMETRIAL CURETTAGE EMC Performed at [place] [address] [place], [place] [address]    Special Requests   Final    NONE Performed at [facility], [place] [address], [place], [place] [address]    Gram Stain   Final    RARE WBC PRESENT, PREDOMINANTLY MONONUCLEAR NO ORGANISMS SEEN    Culture   Final    NO GROWTH 3 DAYS NO ANAEROBES ISOLATED; CULTURE IN PROGRESS FOR 5 DAYS Performed at [place] [address] [place], [place] [address]    Report Status PENDING  Incomplete  SARS CORONAVIRUS 2 (TAT 6-24 HRS) Nasopharyngeal Nasopharyngeal Swab     Status: None   Collection Time: 06/07/21  7:09 PM   Specimen: Nasopharyngeal Swab  Result Value Ref Range Status   SARS Coronavirus 2 NEGATIVE NEGATIVE Final    Comment: (NOTE) SARS-CoV-2 target nucleic acids are NOT DETECTED.  The SARS-CoV-2 RNA is generally detectable in upper and lower respiratory specimens during the acute phase of infection. Negative results do not preclude SARS-CoV-2 infection, do not rule out co-infections with other pathogens, and should not be used as the sole basis for treatment or other patient management decisions. Negative results must be combined with clinical observations, patient history, and epidemiological information. The expected result is Negative.  Fact Sheet for Patients: SugarRoll.be  Fact Sheet for  Healthcare Providers: https://www.woods-mathews.com/  This test is not yet approved or cleared by the [place] FDA and  has been authorized for detection and/or diagnosis of SARS-CoV-2 by FDA under an Emergency Use Authorization (EUA). This EUA will remain  in effect (meaning this test can be used) for the duration of the COVID-19 declaration under Se ction 564(b)(1) of the Act, 21 U.S.C. section 360bbb-3(b)(1), unless the authorization is terminated or revoked sooner.  Performed at [facility], [place] [address] [place], [place] [address]      Time coordinating discharge: 40 minutes  SIGNED:   Elmarie Shiley, MD  [facility]

## 2021-06-09 LAB — CULTURE, BLOOD (ROUTINE X 2)
Culture: NO GROWTH
Culture: NO GROWTH
Special Requests: ADEQUATE
Special Requests: ADEQUATE

## 2021-06-10 LAB — AEROBIC/ANAEROBIC CULTURE W GRAM STAIN (SURGICAL/DEEP WOUND): Culture: NO GROWTH

## 2021-06-11 ENCOUNTER — Telehealth: Payer: Self-pay | Admitting: Gynecologic Oncology

## 2021-06-11 LAB — SURGICAL PATHOLOGY

## 2021-06-11 NOTE — Telephone Encounter (Signed)
Called the patient's daughter, Alexis Mclaughlin, to let her know pathology results from hysterectomy here as well as from tissue passed while at [facility]. Given discrepancy, I have asked that slides be sent here from [facility] for second review. I will have my office move the phone visit we have this Wednesday to next week so that we will have results from second pathology review to discuss.  Alexis Mclaughlin is at [facility] in [place]. She is doing well, meeting post-op milestones from my discussion with her daughter. She [facility] re[facility] with [facility] today.  Jeral Pinch MD Gynecologic Oncology

## 2021-06-13 ENCOUNTER — Inpatient Hospital Stay: Payer: Medicare (Managed Care) | Admitting: Gynecologic Oncology

## 2021-06-20 ENCOUNTER — Encounter: Payer: Self-pay | Admitting: Gynecologic Oncology

## 2021-06-20 ENCOUNTER — Inpatient Hospital Stay: Payer: Medicare (Managed Care) | Attending: Gynecologic Oncology | Admitting: Gynecologic Oncology

## 2021-06-20 DIAGNOSIS — C541 Malignant neoplasm of endometrium: Secondary | ICD-10-CM

## 2021-06-20 NOTE — Progress Notes (Signed)
Gynecologic Oncology Telehealth Consult Note: Gyn-Onc  I connected with [name] on 06/20/21 at  3:30 PM EDT by telephone and verified that I am speaking with the correct person using two identifiers.  I discussed the limitations, risks, security and privacy concerns of performing an evaluation and management service by telemedicine and the availability of in-person appointments. I also discussed with the patient that there may be a patient responsible charge related to this service. The patient expressed understanding and agreed to proceed.  Other persons participating in the visit and their role in the encounter: The patient did not participate in this discussion given her dementia.  I spoke with both the patient's daughter and [place], [name], as well as her primary care provider, Dionicia Abler.  Patient's location: home/work Provider's location: [facility]  Reason for Visit: follow-up after surgery, review of pathology  Interval History: I spoke both with the patient's daughter as well as her primary care provider.  It sounds like from a postoperative standpoint, she is doing very well.  She is not requiring pain medications and is not reporting pain.  She is having normal bowel and bladder function.  There is no report of vaginal bleeding or discharge.  The daughter notes that there is no longer the malodor that she had noticed prior to her hospitalization and surgery.  The patient continues to be in the skilled nursing facility which her primary care provider reports is really related to her current mental health status.  She has had some change to her medication and is having counseling but has been very reluctant to get up and move despite denying being in any pain.  Per report by her PCP, the patient has several days of oral antibiotics left.  Past Medical/Surgical History: Past Medical History:  Diagnosis Date   Arthritis    Constipation    Dementia ([place])    Diabetes mellitus without  complication ([place])    GERD (gastroesophageal reflux disease)    Hypertension    Hypothyroidism     Past Surgical History:  Procedure Laterality Date   BREAST BIOPSY Right 09/02/2018   affirm stereo/path pending   CYSTOSCOPY  06/05/2021   Procedure: CYSTOSCOPY;  Surgeon: [name], MD;  Location: [facility];  Service: Gynecology;;   DILATION AND CURETTAGE OF UTERUS N/A 06/05/2021   Procedure: DILATATION AND CURETTAGE UNDER LAPAROSCOPIC GUIDANCE;  Surgeon: [name], MD;  Location: [facility];  Service: Gynecology;  Laterality: N/A;   LAPAROSCOPY N/A 06/05/2021   Procedure: LAPAROSCOPY OPERATIVE;  Surgeon: [name], MD;  Location: [facility];  Service: Gynecology;  Laterality: N/A;   ROBOTIC ASSISTED TOTAL HYSTERECTOMY WITH BILATERAL SALPINGO OOPHERECTOMY N/A 06/05/2021   Procedure: XI ROBOTIC ASSISTED TOTAL HYSTERECTOMY WITH BILATERAL SALPINGO OOPHORECTOMY;  Surgeon: [name], MD;  Location: [facility];  Service: Gynecology;  Laterality: N/A;   TUBAL LIGATION      Family History  Problem Relation Age of Onset   CAD Mother    CAD Father    Cancer Neg Hx     Social History   Socioeconomic History   Marital status: Married    Spouse name: Not on file   Number of children: Not on file   Years of education: Not on file   Highest education level: Not on file  Occupational History   Not on file  Tobacco Use   Smoking status: Former    Pack years: 0.00    Types: Cigarettes   Smokeless tobacco: Never  Tobacco comments:    Quit at least 30 years ago (prior to 1990)  Vaping Use   Vaping Use: Never used  Substance and Sexual Activity   Alcohol use: Not Currently   Drug use: Never   Sexual activity: Not Currently  Other Topics Concern   Not on file  Social History Narrative   Not on file   Social Determinants of Health   Financial Resource Strain: Not on file  Food Insecurity: Not on file  Transportation Needs: Not on file  Physical Activity: Not  on file  Stress: Not on file  Social Connections: Not on file    Current Medications:  Current Outpatient Medications:    acetaminophen (TYLENOL) 325 MG tablet, Take 2 tablets (650 mg total) by mouth every 6 (six) hours as needed for headache, fever or mild pain., Disp: , Rfl:    amoxicillin-clavulanate (AUGMENTIN) 875-125 MG tablet, Take 1 tablet by mouth every 12 (twelve) hours for 12 days., Disp: 24 tablet, Rfl: 0   cholecalciferol (VITAMIN D3) 25 MCG (1000 UNIT) tablet, Take 1,000 Units by mouth daily., Disp: , Rfl:    docusate sodium (COLACE) 100 MG capsule, Take 100 mg by mouth daily., Disp: , Rfl:    DULoxetine (CYMBALTA) 60 MG capsule, Take 60 mg by mouth daily., Disp: , Rfl:    fluticasone (FLONASE) 50 MCG/ACT nasal spray, Place 2 sprays into both nostrils daily., Disp: , Rfl:    guaiFENesin-dextromethorphan (ROBITUSSIN DM) 100-10 MG/5ML syrup, Take 5 mLs by mouth every 4 (four) hours as needed for cough., Disp: 118 mL, Rfl: 0   insulin aspart (NOVOLOG) 100 UNIT/ML injection, Inject 0-15 Units into the skin every 4 (four) hours., Disp: 10 mL, Rfl: 11   levothyroxine (SYNTHROID) 50 MCG tablet, Take 50 mcg by mouth daily before breakfast., Disp: , Rfl:    lidocaine (LIDODERM) 5 %, Place 2 patches onto the skin daily as needed (pain). Remove & Discard patch within 12 hours or as directed by MD, Disp: , Rfl:    LIPITOR 80 MG tablet, Take 1 tablet by mouth daily., Disp: , Rfl:    metoprolol succinate (TOPROL-XL) 25 MG 24 hr tablet, Take 1 tablet (25 mg total) by mouth daily., Disp: 30 tablet, Rfl: 1   miconazole (MICOTIN) 2 % cream, Apply 1 application topically at bedtime., Disp: , Rfl:    nystatin (MYCOSTATIN/NYSTOP) powder, Apply 1 application topically 2 (two) times daily., Disp: , Rfl:    omeprazole (PRILOSEC) 20 MG capsule, Take 20 mg by mouth daily., Disp: , Rfl:    oxyCODONE (OXY IR/ROXICODONE) 5 MG immediate release tablet, Take 1-2 tablets (5-10 mg total) by mouth every 4 (four)  hours as needed for moderate pain., Disp: 30 tablet, Rfl: 0   polyethylene glycol (MIRALAX / GLYCOLAX) 17 g packet, Take 17 g by mouth 2 (two) times daily., Disp: 14 each, Rfl: 0   sodium chloride (OCEAN) 0.65 % nasal spray, Place 1 spray into the nose 2 (two) times daily as needed for congestion., Disp: , Rfl:    trolamine salicylate (ASPERCREME/ALOE) 10 % cream, Apply 1 application topically every 6 (six) hours as needed for muscle pain., Disp: , Rfl:    Witch Hazel (EQ HYGIENIC CLEANSING WIPES) PADS, Apply 1 application topically 2 (two) times daily., Disp: , Rfl:   Review of Symptoms: Unable to complete given conversations with persons other than the patient.  Pertinent positives and negatives as per HPI  Physical Exam: There were no vitals taken for this  visit. Deferred given limitations of phone visit  Laboratory & Radiologic Studies: Blood work performed at the [place] center on the 21st was notable for a white count of 17.1, hemoglobin of 10.7, and hematocrit of 35  Pathology 6/14: A. UTERUS, CERVIX, BILATERAL FALLOPIAN TUBES AND OVARIES:  - Uterus with poorly differentiated malignant neoplasm showing extensive  necrosis and associated suppurative inflammation, see comment  - Tumor invades deep myometrium but does not involve serosal surface  - Cervix with suppurative inflammation and necrosis - no definite  evidence of tumor  - Right fallopian tube with suppurative inflammation and necrosis  - Benign unremarkable left fallopian tube and bilateral ovaries   Assessment & Plan: Alexis Mclaughlin is a 75 y.o. woman with end-stage high-grade uterine carcinoma who presented in the setting of bacteremia secondary to associated uterine infection.  From my discussions with both [name] and [place] today, it sounds like the patient is physically recovering from surgery very well.  I am a little concerned that her white count was 17.1 last week while on antibiotics.  I have  talked with her PCP about repeating the white count when she finishes her antibiotics in the next couple of days.  We also discussed the challenge of her not wanting to get up and ambulate, which is going to limit her ability to get home and also significantly impact her ability to get treatment.  In terms of her cancer diagnosis, I discussed repeat pathology review and findings all pointing to this being a high-grade carcinoma.  While there was no evidence of metastatic disease on her imaging prior to surgery, I think that repeat imaging after she is healed from surgery is warranted before finalizing a treatment plan.  In the setting of end-stage high risk uterine cancer, we often discussed treatment plan that involves chemotherapy and/or radiation.  I have concerns about the patient's fitness and ability to receive either treatment, and think that she necessitates a multidisciplinary discussion about what would be the best plan moving forward.  I will plan to present her case at tumor conference next week.  Her primary care provider offered to help facilitate getting a CT scan closer to home which will happen at [facility].  Patient's daughter is aware of her in person visit next week, she will not be able to be there because it is her husband's anniversary.  I offered to communicate both with the daughter as well as her PCP the discussion and findings at that visit.  I discussed the assessment and treatment plan with the patient. The patient was provided with an opportunity to ask questions and all were answered. The patient agreed with the plan and demonstrated an understanding of the instructions.   The patient was advised to call back or see an in-person evaluation if the symptoms worsen or if the condition fails to improve as anticipated.   35 minutes of total time was spent for this patient encounter, including preparation, face-to-face counseling with the patient and coordination of care, and  documentation of the encounter.   Jeral Pinch, MD  Division of Gynecologic Oncology  Department of Obstetrics and Gynecology  [facility]

## 2021-06-21 ENCOUNTER — Telehealth: Payer: Self-pay | Admitting: Oncology

## 2021-06-21 DIAGNOSIS — C55 Malignant neoplasm of uterus, part unspecified: Secondary | ICD-10-CM

## 2021-06-21 NOTE — Telephone Encounter (Signed)
Called [name] (daughter) with appointments for 07/03/21 to see Dr. Sondra Come and Dr. Alvy Bimler.  She verbalized agreement and asked that we call [facility] to set up her transportaiton for that day.

## 2021-06-21 NOTE — Telephone Encounter (Signed)
Called [facility] at [phone] and spoke to [facility].  Transportation will be arranged for her appointments. on 7/12

## 2021-06-29 ENCOUNTER — Inpatient Hospital Stay: Payer: Medicare (Managed Care) | Admitting: Gynecologic Oncology

## 2021-06-29 ENCOUNTER — Other Ambulatory Visit: Payer: Self-pay | Admitting: Adult Health

## 2021-06-29 ENCOUNTER — Telehealth: Payer: Self-pay

## 2021-06-29 ENCOUNTER — Other Ambulatory Visit (HOSPITAL_COMMUNITY): Payer: Self-pay | Admitting: Adult Health

## 2021-06-29 DIAGNOSIS — C55 Malignant neoplasm of uterus, part unspecified: Secondary | ICD-10-CM

## 2021-06-29 NOTE — Telephone Encounter (Signed)
Spoke with daughter [name] and told her that Dr. Berline Lopes had an emergency this afternoon and her mother's appointment needed to be rescheduled. Rescheduled her appointment to 07-06-21 at 3:45 pm.   Spoke with [place] at [facility] and confirmed that pt can come at 3:15 FOR 3:45 appointment. Told daughter [name] that the appointment was confirmed with [facility].  [facility] stated that Ms Guzzetta is scheduled for a CT Chest,Abdomen and Pelvis on 07-02-21.

## 2021-07-02 ENCOUNTER — Other Ambulatory Visit: Payer: Self-pay

## 2021-07-02 ENCOUNTER — Encounter: Payer: Self-pay | Admitting: Gynecologic Oncology

## 2021-07-02 ENCOUNTER — Telehealth: Payer: Self-pay | Admitting: Oncology

## 2021-07-02 ENCOUNTER — Ambulatory Visit
Admission: RE | Admit: 2021-07-02 | Discharge: 2021-07-02 | Disposition: A | Discharge status: Home / Self Care | Payer: Medicare (Managed Care) | Source: Ambulatory Visit | Attending: Adult Health | Admitting: Adult Health

## 2021-07-02 DIAGNOSIS — C55 Malignant neoplasm of uterus, part unspecified: Secondary | ICD-10-CM | POA: Diagnosis present

## 2021-07-02 NOTE — Progress Notes (Signed)
I spoke with the patient's primary care provider. She's overall doing much better. She has been working with physical therapy and is up moving around. Repeat white count was just over 11. She had her CT scan earlier today, results still pending. We discussed recommendations from tumor board. Given that the patient is now increasingly mobile and recovering well from surgery, I think she is a candidate for treatment. I discussed that our  recommendation was chemotherapy, most likely carboplatin alone or dose reduced carboplatin and paclitaxel. We discussed the option of treatment here in [place] or in [place], closer to home. It will likely be more feasible for the patient to receive treatment in [place]. I will place a referral to the medical oncology group there.  I also spoke with the patient's daughter. She gave similar confirmation that the patient is  doing much better from a mobility standpoint. I discussed treatment recommendations after our tumor board discussion. We reviewed the possibility of treatment at [facility] versus in [place]. The patient's daughter agrees that [place] would be easier. I will cancel her appointment with radiation and medical oncology here at [facility]. Instead, I will see her in the middle of the day tomorrow. I will have my office sen d a referral to medical oncology in [place].  Valarie Cones MD

## 2021-07-02 NOTE — Telephone Encounter (Signed)
Left a message for Kia at [facility] to change transportation times for tomorrow.   Also called [name] and verified that she is aware of canceled appointments with Dr. Sondra Come and Dr. Alvy Bimler and rescheduled apt with Dr. Berline Lopes.

## 2021-07-03 ENCOUNTER — Inpatient Hospital Stay: Payer: Medicare (Managed Care) | Admitting: Hematology and Oncology

## 2021-07-03 ENCOUNTER — Inpatient Hospital Stay: Payer: Medicare (Managed Care)

## 2021-07-03 ENCOUNTER — Ambulatory Visit
Admission: RE | Admit: 2021-07-03 | Discharge: 2021-07-03 | Disposition: A | Discharge status: Home / Self Care | Payer: Medicare (Managed Care) | Source: Ambulatory Visit | Attending: Radiation Oncology | Admitting: Radiation Oncology

## 2021-07-03 ENCOUNTER — Inpatient Hospital Stay: Payer: Medicare (Managed Care) | Attending: Gynecologic Oncology | Admitting: Gynecologic Oncology

## 2021-07-03 ENCOUNTER — Other Ambulatory Visit: Payer: Self-pay | Admitting: Oncology

## 2021-07-03 ENCOUNTER — Encounter: Payer: Self-pay | Admitting: Oncology

## 2021-07-03 ENCOUNTER — Encounter: Payer: Self-pay | Admitting: Gynecologic Oncology

## 2021-07-03 ENCOUNTER — Ambulatory Visit: Payer: Medicare (Managed Care)

## 2021-07-03 VITALS — BP 169/104 | HR 108 | Temp 98.7°F | Resp 20 | Ht 64.0 in

## 2021-07-03 DIAGNOSIS — E039 Hypothyroidism, unspecified: Secondary | ICD-10-CM | POA: Insufficient documentation

## 2021-07-03 DIAGNOSIS — Z90710 Acquired absence of both cervix and uterus: Secondary | ICD-10-CM | POA: Insufficient documentation

## 2021-07-03 DIAGNOSIS — E119 Type 2 diabetes mellitus without complications: Secondary | ICD-10-CM | POA: Insufficient documentation

## 2021-07-03 DIAGNOSIS — D649 Anemia, unspecified: Secondary | ICD-10-CM | POA: Insufficient documentation

## 2021-07-03 DIAGNOSIS — C541 Malignant neoplasm of endometrium: Secondary | ICD-10-CM

## 2021-07-03 DIAGNOSIS — F0390 Unspecified dementia without behavioral disturbance: Secondary | ICD-10-CM | POA: Insufficient documentation

## 2021-07-03 DIAGNOSIS — C55 Malignant neoplasm of uterus, part unspecified: Secondary | ICD-10-CM

## 2021-07-03 DIAGNOSIS — Z90722 Acquired absence of ovaries, bilateral: Secondary | ICD-10-CM

## 2021-07-03 DIAGNOSIS — Z79899 Other long term (current) drug therapy: Secondary | ICD-10-CM | POA: Insufficient documentation

## 2021-07-03 DIAGNOSIS — I10 Essential (primary) hypertension: Secondary | ICD-10-CM | POA: Insufficient documentation

## 2021-07-03 DIAGNOSIS — K219 Gastro-esophageal reflux disease without esophagitis: Secondary | ICD-10-CM | POA: Insufficient documentation

## 2021-07-03 NOTE — Progress Notes (Signed)
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 Markedly enlarged uterus with extensive fluid and air within this enlarged uterus consistent with intrauterine abscess. Scattered foci of air elsewhere throughout the uterus and in the uterine wall. 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 Gynecologic assessment urged in this regard.   2. Fullness of the right renal collecting system and proximal to mid ureter likely due to compression of the right ureter by a markedly enlarged and apparently infected uterus. No renal or ureteral calculi on either side. Urinary bladder wall thickness normal.   3.  Cholelithiasis.  Suspect mild gallbladder wall thickening.   4. No bowel obstruction. No abscess apart from the changes in the uterus. Appendix appears normal.   5.  Small hiatal hernia.   6. Spinal stenosis L3-4 and L4-5 due to disc protrusion and bony hypertrophy.   7. Aortic Atherosclerosis (ICD10-I70.0). Foci of coronary artery calcification also noted.   8. There is a degree of adrenal hypertrophy bilaterally, a finding of uncertain significance.   06/02/2021 Imaging   Pelvic ultrasound: Moderately enlarged uterus containing moderate amount of air obscuring the endometrium and adjacent myometrium compatible gas-forming infection as shown on recent CT. Ovaries not visualized.   06/02/2021 Initial Biopsy   A. TISSUE PASSED PER VAGINA:  - NECROTIC POORLY DIFFERENTIATED NEOPLASM.   Comment:  There are two large tumor fragments, 15.5 cm and 9.5 cm in greatest  dimensions.  The tissue is almost entirely necrotic. There are a few areas of  histologically undifferentiated malignant neoplasm, at least partially  preserved histologically.  The tumor cells are  mostly small to medium in size, with prominent  nucleoli and abundant mitotic figures. Scattered large cells show  prominent nuclear multilobation or multinuclearity.    06/05/2021 Surgery   Dilation and curettage, Robotic-assisted laparoscopic total hysterectomy with bilateral salpingoophorectomy, excision of posterior cervix and upper vagina, cystoscopy  Findings:  On EUA, enlarged 18cm bulbous uterus, minimally mobile. ON speculum exam, enlarged cervix almost entirely necrotic with necrotic tissue prolapsing through dilated cervical os. Necrotic tissue on D&C, no significant return of tissue and gritty texture felt almost immediately. Cervix approximately 6cm. On intra-abdominal entry, normal upper abdominal survey including liver edge, stomach, and omentum. Normal appearing small and large bowel. Uterus 18cm, bulbous at fundus. Atrophic appearing adnexa. No breach of uterine capsule by tumor. Retroperitoneum significantly fibrotic and edematous, no obvious lymphadenopathy. After delivery of uterine specimen, significantly necrotic tissue noted along posterior colpotomy, likely with residual cervix in situ. After rectovaginal space created, additional posterior cervix excised down to level of healthy vagina. On cystoscopy, bladder dome intact, good efflux noted from bilateral ureteral orifices.    06/05/2021 Pathology Results   A. UTERUS, CERVIX, BILATERAL FALLOPIAN TUBES AND OVARIES:  - Uterus with poorly differentiated malignant neoplasm showing extensive  necrosis and associated suppurative inflammation, see comment  - Tumor invades deep myometrium but does not involve serosal surface  - Cervix with suppurative inflammation and necrosis - no definite  evidence of tumor  - Right fallopian tube with suppurative inflammation and necrosis  - Benign unremarkable left fallopian tube and bilateral ovaries   COMMENT:  Immunohistochemical stains show that the viable portions of tumor  appeared to  label for PAX8 and p16.  Immunostains for CK7, CK20,  pancytokeratin (AE1/AE3), CD99, PR, desmin, NSE,  p53 and vimentin are  negative.  It is uncertain if the extensive necrosis and viability of  the tumor cells affects the staining pattern.  Positive staining for  PAX8 may suggest gynecologic origin.  Dr. Tresa Moore reviewed the case and  concurs with the diagnosis.  Dr. Berline Lopes was notified 06/11/2021.   AMENDMENT NOTE:  Additional sections submitted for histopathologic evaluation show couple  of sections with viable malignant neoplasm.  Hence, the diagnosis is  revised to indicate 'Poorly differentiated malignant neoplasm with  extensive necrosis and associated suppurative inflammation' from 'Uterus  showing extensive necrosis and associated suppurative inflammation'.  Dr. Berline Lopes was notified on 06/11/2021.    07/02/2021 Imaging   CT C/A/P w/o contrast: 1. Status post hysterectomy. 2. Prominent low retroperitoneal, iliac, and pelvic sidewall lymph nodes are unchanged, nonspecific and modestly suspicious for nodal metastatic disease. Attention on follow-up. 3. No noncontrast evidence of distant metastatic disease in the chest, abdomen, or pelvis.   07/03/2021 Initial Diagnosis   Malignant neoplasm of endometrium (HCC)     Interval History: The patient presents with her daughter today for follow-up after surgery.  Most of the history is obtained by her daughter.  The patient is very tearful upon my entry into the room, which the daughter tells me subsequently was related to the fact that in the waiting area they have been discussing her diagnosis of a "tumor" and that she would need treatment every 3 weeks for this tumor.  During our visit, the patient really only commented on her pain in her hips and her shoulders.  Daughter reports that overall her mother is doing very well.  She has not had any vaginal bleeding or discharge.  She reports regular bowel function and baseline bladder function.   Denies any fevers or chills.  Both the daughter and the patient's primary care provider had previously discussed with me her increased depression and anxiety since surgery.  She was initially somewhat resistant to moving and ambulating but her her PCPs report is now much more mobile and working with physical therapy.  Past Medical/Surgical History: Past Medical History:  Diagnosis Date   Arthritis    Constipation    Dementia ([place])    Diabetes mellitus without complication ([place])    GERD (gastroesophageal reflux disease)    Hypertension    Hypothyroidism     Past Surgical History:  Procedure Laterality Date   BREAST BIOPSY Right 09/02/2018   affirm stereo/path pending   CYSTOSCOPY  06/05/2021   Procedure: CYSTOSCOPY;  Surgeon: [name], MD;  Location: [facility];  Service: Gynecology;;   DILATION AND CURETTAGE OF UTERUS N/A 06/05/2021   Procedure: DILATATION AND CURETTAGE UNDER LAPAROSCOPIC GUIDANCE;  Surgeon: [name], MD;  Location: [facility];  Service: Gynecology;  Laterality: N/A;   LAPAROSCOPY N/A 06/05/2021   Procedure: LAPAROSCOPY OPERATIVE;  Surgeon: [name], MD;  Location: [facility];  Service: Gynecology;  Laterality: N/A;   ROBOTIC ASSISTED TOTAL HYSTERECTOMY WITH BILATERAL SALPINGO OOPHERECTOMY N/A 06/05/2021   Procedure: XI ROBOTIC ASSISTED TOTAL HYSTERECTOMY WITH BILATERAL SALPINGO OOPHORECTOMY;  Surgeon: [name], MD;  Location: [facility];  Service: Gynecology;  Laterality: N/A;   TUBAL LIGATION      Family History  Problem Relation Age of Onset   CAD Mother    CAD Father    Cancer Neg Hx     Social History   Socioeconomic History   Marital status: Married    Spouse name: Not on file  Number of children: Not on file   Years of education: Not on file   Highest education level: Not on file  Occupational History   Not on file  Tobacco Use   Smoking status: Former    Pack years: 0.00    Types: Cigarettes   Smokeless tobacco:  Never   Tobacco comments:    Quit at least 30 years ago (prior to 1990)  Vaping Use   Vaping Use: Never used  Substance and Sexual Activity   Alcohol use: Not Currently   Drug use: Never   Sexual activity: Not Currently  Other Topics Concern   Not on file  Social History Narrative   Not on file   Social Determinants of Health   Financial Resource Strain: Not on file  Food Insecurity: Not on file  Transportation Needs: Not on file  Physical Activity: Not on file  Stress: Not on file  Social Connections: Not on file    Current Medications:  Current Outpatient Medications:    acetaminophen (TYLENOL) 325 MG tablet, Take 2 tablets (650 mg total) by mouth every 6 (six) hours as needed for headache, fever or mild pain., Disp: , Rfl:    cholecalciferol (VITAMIN D3) 25 MCG (1000 UNIT) tablet, Take 1,000 Units by mouth daily., Disp: , Rfl:    docusate sodium (COLACE) 100 MG capsule, Take 100 mg by mouth daily., Disp: , Rfl:    DULoxetine (CYMBALTA) 60 MG capsule, Take 60 mg by mouth daily., Disp: , Rfl:    fluticasone (FLONASE) 50 MCG/ACT nasal spray, Place 2 sprays into both nostrils daily., Disp: , Rfl:    guaiFENesin-dextromethorphan (ROBITUSSIN DM) 100-10 MG/5ML syrup, Take 5 mLs by mouth every 4 (four) hours as needed for cough., Disp: 118 mL, Rfl: 0   insulin aspart (NOVOLOG) 100 UNIT/ML injection, Inject 0-15 Units into the skin every 4 (four) hours., Disp: 10 mL, Rfl: 11   levothyroxine (SYNTHROID) 50 MCG tablet, Take 50 mcg by mouth daily before breakfast., Disp: , Rfl:    lidocaine (LIDODERM) 5 %, Place 2 patches onto the skin daily as needed (pain). Remove & Discard patch within 12 hours or as directed by MD, Disp: , Rfl:    LIPITOR 80 MG tablet, Take 1 tablet by mouth daily., Disp: , Rfl:    metoprolol succinate (TOPROL-XL) 25 MG 24 hr tablet, Take 1 tablet (25 mg total) by mouth daily., Disp: 30 tablet, Rfl: 1   miconazole (MICOTIN) 2 % cream, Apply 1 application topically at  bedtime., Disp: , Rfl:    nystatin (MYCOSTATIN/NYSTOP) powder, Apply 1 application topically 2 (two) times daily., Disp: , Rfl:    omeprazole (PRILOSEC) 20 MG capsule, Take 20 mg by mouth daily., Disp: , Rfl:    oxyCODONE (OXY IR/ROXICODONE) 5 MG immediate release tablet, Take 1-2 tablets (5-10 mg total) by mouth every 4 (four) hours as needed for moderate pain., Disp: 30 tablet, Rfl: 0   polyethylene glycol (MIRALAX / GLYCOLAX) 17 g packet, Take 17 g by mouth 2 (two) times daily., Disp: 14 each, Rfl: 0   sodium chloride (OCEAN) 0.65 % nasal spray, Place 1 spray into the nose 2 (two) times daily as needed for congestion., Disp: , Rfl:    trolamine salicylate (ASPERCREME/ALOE) 10 % cream, Apply 1 application topically every 6 (six) hours as needed for muscle pain., Disp: , Rfl:    Witch Hazel (EQ HYGIENIC CLEANSING WIPES) PADS, Apply 1 application topically 2 (two) times daily., Disp: , Rfl:  Review of Systems: Pertinent positives include joint pain, anxiety, and confusion Denies appetite changes, fevers, chills, fatigue, unexplained weight changes. Denies hearing loss, neck lumps or masses, mouth sores, ringing in ears or voice changes. Denies cough or wheezing.  Denies shortness of breath. Denies chest pain or palpitations. Denies leg swelling. Denies abdominal distention, pain, blood in stools, constipation, diarrhea, nausea, vomiting, or early satiety. Denies pain with intercourse, dysuria, frequency, hematuria or incontinence. Denies hot flashes, pelvic pain, vaginal bleeding or vaginal discharge.   Denies itching, rash, or wounds. Denies dizziness, headaches, numbness or seizures. Denies swollen lymph nodes or glands, denies easy bruising or bleeding. Denies decreased concentration.  Physical Exam: BP (!) 169/104 (BP Location: Right Arm, Patient Position: Sitting)   Pulse (!) 108   Temp 98.7 F (37.1 C) (Oral)   Resp 20   Ht 5' 4"  (1.626 m)   SpO2 100%   BMI 42.95 kg/m   General: Alert, very tearful and bursts, responds to some questioning. HEENT: Posterior oropharynx clear, sclera anicteric. Chest: Unlabored breathing on room air. Abdomen: Obese, soft, nontender.  Normoactive bowel sounds.  No masses or hepatosplenomegaly appreciated.  Well-healed upper scopic incisions. Extremities: Grossly normal range of motion.  Warm, well perfused.  Trace edema bilaterally. GU: Normal appearing external genitalia without erythema, excoriation, or lesions.  Speculum exam reveals no discharge or bleeding.  Patient is very intolerant of the exam and cuff only visualized briefly but appears intact.  Bimanual exam reveals cuff intact, sutures palpated, no fluctuance.    Laboratory & Radiologic Studies: Labs drawn [facility] 7[facility] [place] by primary care provider show: White blood cell count of 11.3, hemoglobin 10.1, hematocrit 33.2%, platelets 303, glucose 146, BUN 11, creatinine 0.89, sodium 139, potassium 4.6, chloride 97, bicarb 22, total protein 7.5, albumin 3.8.  Assessment & Plan: Alexis Mclaughlin is a 75 y.o. 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  Patient seems to be doing very well from a postoperative standpoint.  Discussed with the patient's daughter that her incisions are healing well.  I previously talked with the patient's primary care provider as well as her daughter about final diagnosis, which confirms a high-grade carcinoma of the uterus.  Repeat CT scan yesterday shows some retroperitoneal adenopathy.  It is unclear to me whether this is truly metastatic disease versus residual adenopathy in the setting of significant infection.  Either way, in the setting of poorly differentiated carcinoma, patient's case was discussed at our tumor conference this week and  we agreed that if her mobility improved postoperatively (meaning her performance status), then our recommendation was for adjuvant chemotherapy.  Given the patient's location in [place] currently, I had discussed with her daughter the option of treatment here in [place] versus in [place].  I think it will be more feasible for her to be treated in [place] and her daughter agrees.  My office sent a referral to the medical oncology group [facility] yesterday.  My impression is that the plan is for her to stay in the senior care facility through treatment.  The patient was quite emotionally labile today, which per the daughter's report, has been something she is struggled with for many years.  The daughter thinks that she may have undiagnosed bipolar disease.  She is currently on medications for mood stabilization which were recently changed.  Daughter is her  healthcare power of attorney.  She has previously asked me to withhold certain information from her mother.  We agreed that I would discuss the diagnosis of a tumor that needed treatment today.  The patient was very resistant to having any discussion with me as she had previously discussed this with her daughter.  32 minutes of total time was spent for this patient encounter, including preparation, face-to-face counseling with the patient and coordination of care, and documentation of the encounter.  Jeral Pinch, MD  Division of Gynecologic Oncology  Department of Obstetrics and Gynecology  [facility]

## 2021-07-03 NOTE — Patient Instructions (Signed)
You are healing well from surgery! My office has placed the referral for you to see a medical oncologist in [place]

## 2021-07-03 NOTE — Progress Notes (Signed)
Gynecologic Oncology Multi-Disciplinary Disposition Conference Note  Date of the Conference: 07/02/2021  Patient Name: Alexis Mclaughlin  Primary GYN Oncologist: Dr.  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 Disposition is for adjuvant chemotherapy.   This Multidisciplinary conference took place involving physicians from [place], [place], Radiation Oncology, Pathology, Radiology along with the Gynecologic Oncology Nurse Practitioner and RN.  Comprehensive assessment of the patient's malignancy, staging, need for surgery, chemotherapy, radiation therapy, and need for further testing were reviewed. Supportive measures, both inpatient and following discharge were also discussed. The recommended plan of care is documented. Greater than 35 minutes were spent correlating and coordinating this patient's care.

## 2021-07-06 ENCOUNTER — Ambulatory Visit: Payer: Medicare (Managed Care) | Admitting: Gynecologic Oncology

## 2021-07-10 ENCOUNTER — Other Ambulatory Visit: Payer: Self-pay

## 2021-07-10 ENCOUNTER — Encounter: Payer: Self-pay | Admitting: Oncology

## 2021-07-10 ENCOUNTER — Inpatient Hospital Stay: Payer: Medicare (Managed Care)

## 2021-07-10 ENCOUNTER — Inpatient Hospital Stay: Payer: Medicare (Managed Care) | Attending: Oncology | Admitting: Oncology

## 2021-07-10 VITALS — BP 163/87 | HR 97 | Temp 98.3°F | Resp 18

## 2021-07-10 DIAGNOSIS — D509 Iron deficiency anemia, unspecified: Secondary | ICD-10-CM

## 2021-07-10 DIAGNOSIS — E039 Hypothyroidism, unspecified: Secondary | ICD-10-CM | POA: Diagnosis not present

## 2021-07-10 DIAGNOSIS — C55 Malignant neoplasm of uterus, part unspecified: Secondary | ICD-10-CM | POA: Diagnosis present

## 2021-07-10 DIAGNOSIS — F0390 Unspecified dementia without behavioral disturbance: Secondary | ICD-10-CM

## 2021-07-10 DIAGNOSIS — K219 Gastro-esophageal reflux disease without esophagitis: Secondary | ICD-10-CM | POA: Diagnosis not present

## 2021-07-10 DIAGNOSIS — E119 Type 2 diabetes mellitus without complications: Secondary | ICD-10-CM | POA: Diagnosis not present

## 2021-07-10 DIAGNOSIS — Z7189 Other specified counseling: Secondary | ICD-10-CM | POA: Insufficient documentation

## 2021-07-10 DIAGNOSIS — Z90722 Acquired absence of ovaries, bilateral: Secondary | ICD-10-CM | POA: Diagnosis not present

## 2021-07-10 DIAGNOSIS — Z90710 Acquired absence of both cervix and uterus: Secondary | ICD-10-CM | POA: Diagnosis not present

## 2021-07-10 DIAGNOSIS — I10 Essential (primary) hypertension: Secondary | ICD-10-CM | POA: Diagnosis not present

## 2021-07-10 DIAGNOSIS — D649 Anemia, unspecified: Secondary | ICD-10-CM | POA: Diagnosis not present

## 2021-07-10 DIAGNOSIS — Z79899 Other long term (current) drug therapy: Secondary | ICD-10-CM | POA: Diagnosis not present

## 2021-07-10 LAB — COMPREHENSIVE METABOLIC PANEL
ALT: 12 U/L (ref 0–44)
AST: 18 U/L (ref 15–41)
Albumin: 3.3 g/dL — ABNORMAL LOW (ref 3.5–5.0)
Alkaline Phosphatase: 179 U/L — ABNORMAL HIGH (ref 38–126)
Anion gap: 12 (ref 5–15)
BUN: 13 mg/dL (ref 8–23)
CO2: 25 mmol/L (ref 22–32)
Calcium: 9.1 mg/dL (ref 8.9–10.3)
Chloride: 98 mmol/L (ref 98–111)
Creatinine, Ser: 0.75 mg/dL (ref 0.44–1.00)
GFR, Estimated: >60 mL/min (ref >60)
Glucose, Bld: 126 mg/dL — ABNORMAL HIGH (ref 70–99)
Potassium: 3.9 mmol/L (ref 3.5–5.1)
Sodium: 135 mmol/L (ref 135–145)
Total Bilirubin: 0.5 mg/dL (ref 0.3–1.2)
Total Protein: 8.1 g/dL (ref 6.5–8.1)

## 2021-07-10 LAB — CBC WITH DIFFERENTIAL/PLATELET
Abs Immature Granulocytes: 0.04 10*3/uL (ref 0.00–0.07)
Basophils Absolute: 0.1 10*3/uL (ref 0.0–0.1)
Basophils Relative: 0 %
Eosinophils Absolute: 0.5 10*3/uL (ref 0.0–0.5)
Eosinophils Relative: 4 %
HCT: 36.4 % (ref 36.0–46.0)
Hemoglobin: 11 g/dL — ABNORMAL LOW (ref 12.0–15.0)
Immature Granulocytes: 0 %
Lymphocytes Relative: 18 %
Lymphs Abs: 2.5 10*3/uL (ref 0.7–4.0)
MCH: 23.4 pg — ABNORMAL LOW (ref 26.0–34.0)
MCHC: 30.2 g/dL (ref 30.0–36.0)
MCV: 77.3 fL — ABNORMAL LOW (ref 80.0–100.0)
Monocytes Absolute: 1.2 10*3/uL — ABNORMAL HIGH (ref 0.1–1.0)
Monocytes Relative: 9 %
Neutro Abs: 9.4 10*3/uL — ABNORMAL HIGH (ref 1.7–7.7)
Neutrophils Relative %: 69 %
Platelets: 424 10*3/uL — ABNORMAL HIGH (ref 150–400)
RBC: 4.71 MIL/uL (ref 3.87–5.11)
RDW: 16.8 % — ABNORMAL HIGH (ref 11.5–15.5)
WBC: 13.7 10*3/uL — ABNORMAL HIGH (ref 4.0–10.5)
nRBC: 0 % (ref 0.0–0.2)

## 2021-07-10 LAB — IRON AND TIBC
Iron: 28 ug/dL (ref 28–170)
Saturation Ratios: 11 % (ref 10.4–31.8)
TIBC: 249 ug/dL — ABNORMAL LOW (ref 250–450)
UIBC: 221 ug/dL

## 2021-07-10 LAB — FERRITIN: Ferritin: 180 ng/mL (ref 11–307)

## 2021-07-10 LAB — RETIC PANEL
Immature Retic Fract: 12.8 % (ref 2.3–15.9)
RBC.: 4.73 MIL/uL (ref 3.87–5.11)
Retic Count, Absolute: 88 10*3/uL (ref 19.0–186.0)
Retic Ct Pct: 1.9 % (ref 0.4–3.1)
Reticulocyte Hemoglobin: 26.8 pg — ABNORMAL LOW (ref >27.9)

## 2021-07-10 MED ORDER — ONDANSETRON HCL 8 MG PO TABS: 8.0000 mg | ORAL_TABLET | Freq: Two times a day (BID) | ORAL | 1 refills | Status: DC | PRN

## 2021-07-10 MED ORDER — PROCHLORPERAZINE MALEATE 10 MG PO TABS: 10.0000 mg | ORAL_TABLET | Freq: Four times a day (QID) | ORAL | 1 refills | Status: DC | PRN

## 2021-07-10 NOTE — Progress Notes (Signed)
Hematology/Oncology Consult note 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  She was also noted to have lactic acidosis, leukocytosis, tachycardia. 06/01/2021, CT scan showed a markedly enlarged uterus with extensive fluid and air, consistent with intrauterine abscess. Blood cultures obtained on admission grew Clostridium perfringens.  She was treated with IV antibiotics, she passed a large amount of tissue vaginally on 06/02/2021-pathology showed necrotic poorly differentiated neoplasm.  She was transferred to [facility] for further management.  Patient was seen by gynecology oncology. 06/05/2021, status post robotic assisted laparoscopic total hysterectomy with bilateral salpingo-oophorectomy, excision of posterior cervix and upper vaginal, cystoscopy, D&C. Findings:  On EUA, enlarged 18cm bulbous uterus, minimally mobile. ON speculum exam, enlarged cervix almost entirely necrotic with necrotic tissue prolapsing through dilated cervical os. Necrotic tissue on D&C, no significant return of tissue and gritty texture felt almost immediately. Cervix approximately 6cm. On intra-abdominal entry, normal upper abdominal survey including liver edge, stomach, and omentum. Normal appearing small and large bowel. Uterus 18cm, bulbous at fundus. Atrophic appearing adnexa. No breach of uterine capsule by tumor. Retroperitoneum significantly fibrotic and edematous, no  obvious lymphadenopathy. After delivery of uterine specimen, significantly necrotic tissue noted along posterior colpotomy, likely with residual cervix in situ. After rectovaginal space created, additional posterior cervix excised down to level of healthy vagina. On cystoscopy, bladder dome intact, good efflux noted from bilateral ureteral orifices.   Pathology showed poorly differentiated malignant neoplasm with extensive necrosis and associated with superior of inflammation.  Tumor invades deep myometrium but does not involve serosal surface.  Cervix with separative inflammation and necrosis-no definitive evidence of tumor.  Right fallopian tube with suppurative inflammation and necrosis.  Benign unremarkable left fallopian tube and bilateral ovaries. Postop, patient gradually improves and was discharged to SNF on 06/08/2021..  06/20/2021, patient was seen by Dr. Berline Lopes for postop follow-up.  She appears physically recovering from her surgery very well. Patient's case was presented to GYN oncology tumor conference by Dr. Berline Lopes. 07/02/2021, repeat CT chest abdomen pelvis showed prominent low retroperitoneal, iliac, pelvic sidewall lymph nodes are unchanged.,  Nonspecific and modestly suspicious for nodal metastasis.  Attention on follow-up.  No noncontrast radiographic evidence of distant metastasis disease in the chest, abdomen or pelvis.  07/03/2021.  Follow-up visit with Dr. Berline Lopes Given that patient's mobility has improved and recovered well from her surgery.  Dr. Berline Lopes recommends adjuvant chemotherapy most likely carboplatin alone or dose reduced carboplatin and paclitaxel.  Since patient lives closer to [facility], patient was referred to establish care with [facility] cancer center for further management.  Patient has baseline dementia, she goes to [facility] care facility [facility].  Patient was accompanied by her daughter Alexis Mclaughlin who reports to be patient's power of attorney. Patient is a poor  historian.  She denies any pain.  Per daughter, patient has recovered very well since her surgery.  She walks with a walker and participates in physical exercise with assistance at the facility.  No reports of pain or  vaginal discharge, fever or chills.  Patient appears tearful, and per daughter, they have discussed with patient about her diagnosis of " tumor", and prefers no further mentioning of " cancer" during our discussion today.  Review of Systems  Unable to perform ROS: Dementia  Constitutional:  Negative for fever.  Genitourinary:  Negative for vaginal bleeding and vaginal discharge.    MEDICAL HISTORY:  Past Medical History:  Diagnosis Date   Arthritis    Constipation    Dementia ([place])    Diabetes mellitus without complication ([place])    GERD (gastroesophageal reflux disease)    Hypertension    Hypothyroidism     SURGICAL HISTORY: Past Surgical History:  Procedure Laterality Date   BREAST BIOPSY Right 09/02/2018   affirm stereo/path pending   CYSTOSCOPY  06/05/2021   Procedure: CYSTOSCOPY;  Surgeon: [name], MD;  Location: [facility];  Service: Gynecology;;   DILATION AND CURETTAGE OF UTERUS N/A 06/05/2021   Procedure: DILATATION AND CURETTAGE UNDER LAPAROSCOPIC GUIDANCE;  Surgeon: [name], MD;  Location: [facility];  Service: Gynecology;  Laterality: N/A;   LAPAROSCOPY N/A 06/05/2021   Procedure: LAPAROSCOPY OPERATIVE;  Surgeon: [name], MD;  Location: [facility];  Service: Gynecology;  Laterality: N/A;   ROBOTIC ASSISTED TOTAL HYSTERECTOMY WITH BILATERAL SALPINGO OOPHERECTOMY N/A 06/05/2021   Procedure: XI ROBOTIC ASSISTED TOTAL HYSTERECTOMY WITH BILATERAL SALPINGO OOPHORECTOMY;  Surgeon: [name], MD;  Location: [facility];  Service: Gynecology;  Laterality: N/A;   TUBAL LIGATION      SOCIAL HISTORY: Social History   Socioeconomic History   Marital status: Married    Spouse name: Not on file   Number of children: Not on file   Years of  education: Not on file   Highest education level: Not on file  Occupational History   Not on file  Tobacco Use   Smoking status: Former    Types: Cigarettes   Smokeless tobacco: Never   Tobacco comments:    Quit at least 30 years ago (prior to 1990)  Vaping Use   Vaping Use: Never used  Substance and Sexual Activity   Alcohol use: Not Currently   Drug use: Never   Sexual activity: Not Currently  Other Topics Concern   Not on file  Social History Narrative   Not on file   Social Determinants of Health   Financial Resource Strain: Not on file  Food Insecurity: Not on file  Transportation Needs: Not on file  Physical Activity: Not on file  Stress: Not on file  Social Connections: Not on file  Intimate Partner Violence: Not on file    FAMILY HISTORY: Family History  Problem Relation Age of Onset   CAD Mother    CAD Father    Cancer Neg Hx     ALLERGIES:  is allergic to ace inhibitors.  MEDICATIONS:  Current Outpatient Medications  Medication Sig Dispense Refill   acetaminophen (TYLENOL) 325 MG tablet Take 2 tablets (650 mg total) by mouth every 6 (six) hours as needed for headache, fever or mild pain.     acetic acid-hydrocortisone (VOSOL-HC) OTIC solution 3 drops 4 (four) times daily.     amLODipine (NORVASC) 10 MG tablet Take 10 mg by mouth daily.     ANTI-ITCH lotion Apply topically.     cholecalciferol (VITAMIN D3) 25 MCG (1000 UNIT) tablet Take 1,000 Units by mouth daily.     diclofenac Sodium (VOLTAREN) 1 % GEL SMARTSIG:Gram(s) Topical Twice Daily  docusate sodium (COLACE) 100 MG capsule Take 100 mg by mouth daily.     DULoxetine (CYMBALTA) 60 MG capsule Take 60 mg by mouth daily.     fluticasone (FLONASE) 50 MCG/ACT nasal spray Place 2 sprays into both nostrils daily.     guaiFENesin-dextromethorphan (ROBITUSSIN DM) 100-10 MG/5ML syrup Take 5 mLs by mouth every 4 (four) hours as needed for cough. 118 mL 0   levothyroxine (SYNTHROID) 50 MCG tablet Take 50  mcg by mouth daily before breakfast.     lidocaine (LIDODERM) 5 % Place 2 patches onto the skin daily as needed (pain). Remove & Discard patch within 12 hours or as directed by MD     LIPITOR 80 MG tablet Take 1 tablet by mouth daily.     metFORMIN (GLUCOPHAGE-XR) 500 MG 24 hr tablet Take 500 mg by mouth 2 (two) times daily.     metoprolol succinate (TOPROL-XL) 25 MG 24 hr tablet Take 1 tablet (25 mg total) by mouth daily. 30 tablet 1   miconazole (MICOTIN) 2 % cream Apply 1 application topically at bedtime.     nystatin (MYCOSTATIN/NYSTOP) powder Apply 1 application topically 2 (two) times daily.     omeprazole (PRILOSEC) 20 MG capsule Take 20 mg by mouth daily.     oxyCODONE (OXY IR/ROXICODONE) 5 MG immediate release tablet Take 1-2 tablets (5-10 mg total) by mouth every 4 (four) hours as needed for moderate pain. 30 tablet 0   polyethylene glycol (MIRALAX / GLYCOLAX) 17 g packet Take 17 g by mouth 2 (two) times daily. 14 each 0   sodium chloride (OCEAN) 0.65 % nasal spray Place 1 spray into the nose 2 (two) times daily as needed for congestion.     traMADol (ULTRAM) 50 MG tablet Take by mouth.     traZODone (DESYREL) 50 MG tablet Take 25 mg by mouth at bedtime.     trolamine salicylate (ASPERCREME/ALOE) 10 % cream Apply 1 application topically every 6 (six) hours as needed for muscle pain.     Witch Hazel (EQ HYGIENIC CLEANSING WIPES) PADS Apply 1 application topically 2 (two) times daily.     DULoxetine (CYMBALTA) 30 MG capsule Take 30 mg by mouth daily. (Patient not taking: Reported on 07/10/2021)     insulin aspart (NOVOLOG) 100 UNIT/ML injection Inject 0-15 Units into the skin every 4 (four) hours. (Patient not taking: Reported on 07/10/2021) 10 mL 11   polyethylene glycol powder (GLYCOLAX/MIRALAX) 17 GM/SCOOP powder Take by mouth. (Patient not taking: Reported on 07/10/2021)     No current facility-administered medications for this visit.     PHYSICAL EXAMINATION: ECOG PERFORMANCE  STATUS: 2 - Symptomatic, <50% confined to bed Vitals:   07/10/21 1054  BP: (!) 163/87  Pulse: 97  Resp: 18  Temp: 98.3 F (36.8 C)  SpO2: 93%   Filed Weights    Physical Exam Constitutional:      General: She is not in acute distress.    Appearance: She is obese.     Comments: Patient sits in a wheelchair today.  HENT:     Head: Normocephalic and atraumatic.  Eyes:     General: No scleral icterus. Cardiovascular:     Rate and Rhythm: Normal rate and regular rhythm.     Heart sounds: Normal heart sounds.  Pulmonary:     Effort: Pulmonary effort is normal. No respiratory distress.     Breath sounds: No wheezing.  Abdominal:     General: Bowel sounds are normal. There  is no distension.     Palpations: Abdomen is soft.  Musculoskeletal:        General: No deformity. Normal range of motion.     Cervical back: Normal range of motion and neck supple.  Skin:    General: Skin is warm and dry.     Findings: No erythema or rash.  Neurological:     Mental Status: She is alert. Mental status is at baseline.     Cranial Nerves: No cranial nerve deficit.     Coordination: Coordination normal.     Comments: Orientated x2 name and place.  Psychiatric:     Comments: Tearful    LABORATORY DATA:  I have reviewed the data as listed Lab Results  Component Value Date   WBC 13.7 (H) 07/10/2021   HGB 11.0 (L) 07/10/2021   HCT 36.4 07/10/2021   MCV 77.3 (L) 07/10/2021   PLT 424 (H) 07/10/2021   Recent Labs    06/04/21 0333 06/05/21 0328 06/06/21 0334 06/07/21 0340 07/10/21 1147  NA 136 136 138 134* 135  K 3.5 3.2* 3.5 3.8 3.9  CL 104 102 102 100 98  CO2 24 25 27 26 25   GLUCOSE 138* 147* 162* 164* 126*  BUN 18 10 12 10 13   CREATININE 0.94 0.63 0.91 0.83 0.75  CALCIUM 7.6* 7.7* 7.7* 7.6* 9.1  GFRNONAA >60 >60 >60 >60 >60  PROT 6.3* 6.5  --   --  8.1  ALBUMIN 2.3* 2.3*  --   --  3.3*  AST 51* 40  --   --  18  ALT 32 27  --   --  12  ALKPHOS 159* 146*  --   --  179*   BILITOT 0.8 0.5  --   --  0.5   Iron/TIBC/Ferritin/ %Sat    Component Value Date/Time   IRON 28 07/10/2021 1147   IRON 54 07/05/2014 0958   TIBC 249 (L) 07/10/2021 1147   TIBC 334 07/05/2014 0958   FERRITIN 180 07/10/2021 1147   FERRITIN 159 07/05/2014 0958   IRONPCTSAT 11 07/10/2021 1147   IRONPCTSAT 16 07/05/2014 0958      RADIOGRAPHIC STUDIES: I have personally reviewed the radiological images as listed and agreed with the findings in the report. CT CHEST ABDOMEN PELVIS WO CONTRAST  Result Date: 07/02/2021 CLINICAL DATA:  Uterine cancer staging, status post hysterectomy EXAM: CT CHEST, ABDOMEN AND PELVIS WITHOUT CONTRAST TECHNIQUE: Multidetector CT imaging of the chest, abdomen and pelvis was performed following the standard protocol without IV contrast. Oral enteric contrast was administered. COMPARISON:  CT abdomen pelvis, 06/01/2021 FINDINGS: CT CHEST FINDINGS Cardiovascular: Aortic atherosclerosis. Normal heart size. No pericardial effusion. Mediastinum/Nodes: No enlarged mediastinal, hilar, or axillary lymph nodes. Thyroid gland, trachea, and esophagus demonstrate no significant findings. Lungs/Pleura: Lungs are clear. No pleural effusion or pneumothorax. Musculoskeletal: No chest wall mass or suspicious bone lesions identified. CT ABDOMEN PELVIS FINDINGS Hepatobiliary: No solid liver abnormality is seen. No gallstones, gallbladder wall thickening, or biliary dilatation. Pancreas: Unremarkable. No pancreatic ductal dilatation or surrounding inflammatory changes. Spleen: Normal in size without significant abnormality. Adrenals/Urinary Tract: Adrenal glands are unremarkable. Exophytic cyst of the lateral midportion of the left kidney (series 2, image 59). Kidneys are otherwise normal, without renal calculi, solid lesion, or hydronephrosis. Bladder is unremarkable. Stomach/Bowel: Stomach is within normal limits. Appendix appears normal. No evidence of bowel wall thickening, distention, or  inflammatory changes. Descending and sigmoid diverticulosis. Vascular/Lymphatic: Aortic atherosclerosis. Prominent low retroperitoneal, iliac, and pelvic sidewall  lymph nodes are unchanged, largest left pelvic sidewall node measuring 1.9 x 0.9 cm (series 2, image 103). Reproductive: Status post hysterectomy. Other: No abdominal wall hernia or abnormality. No abdominopelvic ascites. Musculoskeletal: No acute or significant osseous findings. IMPRESSION: 1. Status post hysterectomy. 2. Prominent low retroperitoneal, iliac, and pelvic sidewall lymph nodes are unchanged, nonspecific and modestly suspicious for nodal metastatic disease. Attention on follow-up. 3. No noncontrast evidence of distant metastatic disease in the chest, abdomen, or pelvis. Aortic Atherosclerosis (ICD10-I70.0). Electronically Signed   By: Eddie Candle M.D.   On: 07/02/2021 16:04      ASSESSMENT & PLAN:  1. Malignant neoplasm of uterus, unspecified site ([place])   2. Goals of care, counseling/discussion   3. Dementia without behavioral disturbance, unspecified dementia type ([place])   4. Microcytic anemia    #Poorly differentiated neoplasm of uterus Pathology was reviewed and discussed with patient and her daughter. Also reviewed her CT images.  There is no signs of distant metastasis. CT showed retroperitoneal, iliac, pelvic sidewall lymph node which could be reactive secondary to necrosis/abscess versus nodal metastasis.  Regardless, given that she has poorly differentiated neoplasm uterus, I agree with Dr. Berline Lopes that she would benefit from adjuvant chemotherapy. We discussed about rationale and potential side effects of carboplatin and Taxol. Chemotherapy education was provided.  We had discussed the composition of chemotherapy regimen, length of chemo cycle, duration of treatment and the time to assess response to treatment.    I explained to the patient the risks and benefits of chemotherapy carboplatin and Taxol including all but  not limited to allergic infusion reaction, hair loss, mouth sore, nausea, vomiting, diarrhea, low blood counts, bleeding, neuropathy and risk of life threatening infection and even death, secondary malignancy etc. we also discussed that patient has multiple other comorbidities, performance status of 2 and I plan to start with dose reduced chemotherapy and plan 3 cycles. Patient's daughter Sharion Balloon of attorney voices understanding and she agrees to proceed chemotherapy.   # Chemotherapy education;  Antiemetics-Zofran and Compazine;  sent to pharmacy. Mediport option was discussed and daughter prefers to utilize patient's peripheral IV vein.  She appears to have no IV access issues so far.  Supportive care measures are necessary for patient well-being and will be provided as necessary. We spent sufficient time to discuss many aspect of care, questions were answered to patient's satisfaction.  Check CBC CMP, iron tibc ferritin, retic panel Microcytic anemia, hemoglobin has improved to 11. MCV 77.  Borderline iron saturation 11, decreased retic hemoglobin.   Follow-up lab MD carbo and Taxol in 1 to 2 weeks.  Orders Placed This Encounter  Procedures   CBC with Differential    Standing Status:   Future    Number of Occurrences:   1    Standing Expiration Date:   07/10/2022   Comprehensive metabolic panel    Standing Status:   Future    Number of Occurrences:   1    Standing Expiration Date:   07/10/2022   Retic Panel    Standing Status:   Future    Number of Occurrences:   1    Standing Expiration Date:   07/10/2022   Iron and TIBC    Standing Status:   Future    Number of Occurrences:   1    Standing Expiration Date:   07/10/2022   Ferritin    Standing Status:   Future    Number of Occurrences:   1    Standing  Expiration Date:   07/10/2022    All questions were answered. The patient knows to call the clinic with any problems questions or concerns.   Joylene John D, NP    Return of  visit:  Thank you for this kind referral and the opportunity to participate in the care of this patient. A copy of today's note is routed to referring provider    Earlie Server, MD, PhD Hematology Oncology [facility] at [facility] Pager- [phone] 07/10/2021

## 2021-07-10 NOTE — Progress Notes (Signed)
START OFF PATHWAY REGIMEN - Other   [id number]:Carboplatin AUC=5 IV D1 + Paclitaxel 135 mg/m2 IV D1 q21 Days:   A cycle is every 21 days:     Paclitaxel      Carboplatin   **Always confirm dose/schedule in your pharmacy ordering system**  Patient Characteristics: Intent of Therapy: Curative Intent, Discussed with Patient

## 2021-07-16 ENCOUNTER — Encounter: Payer: Self-pay | Admitting: Oncology

## 2021-07-17 ENCOUNTER — Other Ambulatory Visit: Payer: Self-pay

## 2021-07-17 ENCOUNTER — Inpatient Hospital Stay: Payer: Medicare (Managed Care)

## 2021-07-25 ENCOUNTER — Inpatient Hospital Stay: Payer: Medicare (Managed Care) | Admitting: *Deleted

## 2021-07-25 ENCOUNTER — Encounter: Payer: Self-pay | Admitting: Oncology

## 2021-07-25 ENCOUNTER — Inpatient Hospital Stay: Payer: Medicare (Managed Care)

## 2021-07-25 ENCOUNTER — Inpatient Hospital Stay: Payer: Medicare (Managed Care) | Attending: Oncology

## 2021-07-25 ENCOUNTER — Inpatient Hospital Stay (HOSPITAL_BASED_OUTPATIENT_CLINIC_OR_DEPARTMENT_OTHER): Payer: Medicare (Managed Care) | Admitting: Oncology

## 2021-07-25 VITALS — BP 153/87 | HR 112 | Temp 97.3°F | Resp 18 | Wt 223.0 lb

## 2021-07-25 VITALS — BP 159/93 | HR 109

## 2021-07-25 DIAGNOSIS — F0390 Unspecified dementia without behavioral disturbance: Secondary | ICD-10-CM | POA: Diagnosis not present

## 2021-07-25 DIAGNOSIS — Z5111 Encounter for antineoplastic chemotherapy: Secondary | ICD-10-CM | POA: Diagnosis not present

## 2021-07-25 DIAGNOSIS — Z7189 Other specified counseling: Secondary | ICD-10-CM

## 2021-07-25 DIAGNOSIS — C55 Malignant neoplasm of uterus, part unspecified: Secondary | ICD-10-CM

## 2021-07-25 DIAGNOSIS — F410 Panic disorder [episodic paroxysmal anxiety] without agoraphobia: Secondary | ICD-10-CM | POA: Diagnosis not present

## 2021-07-25 LAB — CBC WITH DIFFERENTIAL/PLATELET
Abs Immature Granulocytes: 0.05 10*3/uL (ref 0.00–0.07)
Basophils Absolute: 0.1 10*3/uL (ref 0.0–0.1)
Basophils Relative: 0 %
Eosinophils Absolute: 0.2 10*3/uL (ref 0.0–0.5)
Eosinophils Relative: 2 %
HCT: 38.6 % (ref 36.0–46.0)
Hemoglobin: 11.6 g/dL — ABNORMAL LOW (ref 12.0–15.0)
Immature Granulocytes: 0 %
Lymphocytes Relative: 19 %
Lymphs Abs: 2.1 10*3/uL (ref 0.7–4.0)
MCH: 23.2 pg — ABNORMAL LOW (ref 26.0–34.0)
MCHC: 30.1 g/dL (ref 30.0–36.0)
MCV: 77 fL — ABNORMAL LOW (ref 80.0–100.0)
Monocytes Absolute: 0.8 10*3/uL (ref 0.1–1.0)
Monocytes Relative: 7 %
Neutro Abs: 8.2 10*3/uL — ABNORMAL HIGH (ref 1.7–7.7)
Neutrophils Relative %: 72 %
Platelets: 481 10*3/uL — ABNORMAL HIGH (ref 150–400)
RBC: 5.01 MIL/uL (ref 3.87–5.11)
RDW: 16.9 % — ABNORMAL HIGH (ref 11.5–15.5)
WBC: 11.4 10*3/uL — ABNORMAL HIGH (ref 4.0–10.5)
nRBC: 0 % (ref 0.0–0.2)

## 2021-07-25 LAB — COMPREHENSIVE METABOLIC PANEL
ALT: 12 U/L (ref 0–44)
AST: 21 U/L (ref 15–41)
Albumin: 3.5 g/dL (ref 3.5–5.0)
Alkaline Phosphatase: 181 U/L — ABNORMAL HIGH (ref 38–126)
Anion gap: 11 (ref 5–15)
BUN: 14 mg/dL (ref 8–23)
CO2: 25 mmol/L (ref 22–32)
Calcium: 8.9 mg/dL (ref 8.9–10.3)
Chloride: 97 mmol/L — ABNORMAL LOW (ref 98–111)
Creatinine, Ser: 0.77 mg/dL (ref 0.44–1.00)
GFR, Estimated: >60 mL/min (ref >60)
Glucose, Bld: 185 mg/dL — ABNORMAL HIGH (ref 70–99)
Potassium: 3.5 mmol/L (ref 3.5–5.1)
Sodium: 133 mmol/L — ABNORMAL LOW (ref 135–145)
Total Bilirubin: 0.9 mg/dL (ref 0.3–1.2)
Total Protein: 8 g/dL (ref 6.5–8.1)

## 2021-07-25 MED ORDER — PALONOSETRON HCL INJECTION 0.25 MG/5ML
0.2500 mg | Freq: Once | INTRAVENOUS | Status: AC
  Administered 2021-07-25: 0.25 mg via INTRAVENOUS
  Filled 2021-07-25: qty 5

## 2021-07-25 MED ORDER — FERROUS SULFATE 325 (65 FE) MG PO TBEC: 325.0000 mg | DELAYED_RELEASE_TABLET | Freq: Every day | ORAL | 1 refills | Status: AC

## 2021-07-25 MED ORDER — SODIUM CHLORIDE 0.9 % IV SOLN
Freq: Once | INTRAVENOUS | Status: AC
  Filled 2021-07-25: qty 250

## 2021-07-25 MED ORDER — SODIUM CHLORIDE 0.9 % IV SOLN
150.0000 mg | Freq: Once | INTRAVENOUS | Status: AC
  Administered 2021-07-25: 150 mg via INTRAVENOUS
  Filled 2021-07-25: qty 150

## 2021-07-25 MED ORDER — DIPHENHYDRAMINE HCL 50 MG/ML IJ SOLN
50.0000 mg | Freq: Once | INTRAMUSCULAR | Status: AC
  Administered 2021-07-25: 50 mg via INTRAVENOUS
  Filled 2021-07-25: qty 1

## 2021-07-25 MED ORDER — SODIUM CHLORIDE 0.9 % IV SOLN
10.0000 mg | Freq: Once | INTRAVENOUS | Status: AC
  Administered 2021-07-25: 10 mg via INTRAVENOUS
  Filled 2021-07-25: qty 10

## 2021-07-25 MED ORDER — SODIUM CHLORIDE 0.9 % IV SOLN
406.0000 mg | Freq: Once | INTRAVENOUS | Status: DC
  Filled 2021-07-25: qty 41

## 2021-07-25 MED ORDER — SODIUM CHLORIDE 0.9 % IV SOLN: 432.4000 mg | Freq: Once | INTRAVENOUS | Status: DC

## 2021-07-25 MED ORDER — FAMOTIDINE 20 MG IN NS 100 ML IVPB
20.0000 mg | Freq: Once | INTRAVENOUS | Status: AC
  Administered 2021-07-25: 20 mg via INTRAVENOUS
  Filled 2021-07-25: qty 20

## 2021-07-25 MED ORDER — SODIUM CHLORIDE 0.9 % IV SOLN
135.0000 mg/m2 | Freq: Once | INTRAVENOUS | Status: DC
  Filled 2021-07-25: qty 48

## 2021-07-25 NOTE — Progress Notes (Signed)
At 1202, pt was assisted to the bathroom with no complications. Once pt was done using the bathroom, pt became very agitated and irritable screaming that she "was having a burning sensation in her stomach." Pt.'s last premedication Emend was currently running and was stopped while using the bathroom. Vitals were taken ** see flowsheet for vitals* & * See MAR for all premedications that were given** Dr Tasia Catchings notified and arrived to assess pt. Pt still agitated and cursing. Pt stated she "did not want to get anymore medications and take that out (while pointing at her IV)." Per Dr Tasia Catchings to not continue with chemotherapy today and discharge pt back to facility. Per Dr Tasia Catchings she will call pt.'s daughter, [name] , to update her of the current situation and to determine a plan for her future appointments. [facility] to [facility] pt back to facility. Pt remained agitated until discharge. Pt stable upon discharge.   Iasha Mccalister [employer]

## 2021-07-25 NOTE — Progress Notes (Signed)
Multidisciplinary Oncology Council Documentation  Ivonna Kinnick was presented by our [facility] on 07/25/2021, which included representatives from:  Palliative Care Dietitian  Physical/Occupational Therapist Nurse Navigator Genetics Speech Therapist Social work Survivorship RN Hotel manager Research RN [employer] currently presents with history of uterine cancer.  We reviewed previous medical and familial history, history of present illness, and recent lab results along with all available histopathologic and imaging studies. [facility] considered available treatment options and made the following recommendations/referrals:  No recommendations at this time.  The MOC is a meeting of clinicians from various specialty areas who evaluate and discuss patients for whom a multidisciplinary approach is being considered. Final determinations in the plan of care are those of the provider(s).   Today's extended care, comprehensive team conference, Mairead was not present for the discussion and was not examined.

## 2021-07-25 NOTE — Progress Notes (Signed)
Patient here for follow up and new tx.

## 2021-07-25 NOTE — Progress Notes (Signed)
Vitals reviewed with MD and treatment team. Per MD to proceed with treatment. Treatment team updated.   Alexis Mclaughlin [employer]

## 2021-07-25 NOTE — Progress Notes (Signed)
Hematology/Oncology progress note [facility] Telephone:([phone] [phone]   Patient Care Team: Dionicia Abler, NP as PCP - General Clent Jacks, RN as Oncology Nurse Navigator  REFERRING PROVIDER: Dionicia Abler, NP  CHIEF COMPLAINTS/REASON FOR VISIT:  uterus neoplasm.  HISTORY OF PRESENTING ILLNESS:   Alexis Mclaughlin is a  75 y.o.  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  She was also noted to have lactic acidosis, leukocytosis, tachycardia. 06/01/2021, CT scan showed a markedly enlarged uterus with extensive fluid and air, consistent with intrauterine abscess. Blood cultures obtained on admission grew Clostridium perfringens.  She was treated with IV antibiotics, she passed a large amount of tissue vaginally on 06/02/2021-pathology showed necrotic poorly differentiated neoplasm.  She was transferred to [facility] for further management.  Patient was seen by gynecology oncology. 06/05/2021, status post robotic assisted laparoscopic total hysterectomy with bilateral salpingo-oophorectomy, excision of posterior cervix and upper vaginal, cystoscopy, D&C. Findings:  On EUA, enlarged 18cm bulbous uterus, minimally mobile. ON speculum exam, enlarged cervix almost entirely necrotic with necrotic tissue prolapsing through dilated cervical os. Necrotic tissue on D&C, no significant return of tissue and gritty texture felt almost immediately. Cervix approximately 6cm. On intra-abdominal entry, normal upper abdominal survey including liver edge, stomach, and omentum. Normal appearing small and large bowel. Uterus 18cm, bulbous at fundus. Atrophic appearing adnexa. No breach of uterine capsule by tumor. Retroperitoneum significantly fibrotic and edematous, no obvious  lymphadenopathy. After delivery of uterine specimen, significantly necrotic tissue noted along posterior colpotomy, likely with residual cervix in situ. After rectovaginal space created, additional posterior cervix excised down to level of healthy vagina. On cystoscopy, bladder dome intact, good efflux noted from bilateral ureteral orifices.   Pathology showed poorly differentiated malignant neoplasm with extensive necrosis and associated with superior of inflammation.  Tumor invades deep myometrium but does not involve serosal surface.  Cervix with separative inflammation and necrosis-no definitive evidence of tumor.  Right fallopian tube with suppurative inflammation and necrosis.  Benign unremarkable left fallopian tube and bilateral ovaries. Postop, patient gradually improves and was discharged to SNF on 06/08/2021..  06/20/2021, patient was seen by Dr. Berline Lopes for postop follow-up.  She appears physically recovering from her surgery very well. Patient's case was presented to GYN oncology tumor conference by Dr. Berline Lopes. 07/02/2021, repeat CT chest abdomen pelvis showed prominent low retroperitoneal, iliac, pelvic sidewall lymph nodes are unchanged.,  Nonspecific and modestly suspicious for nodal metastasis.  Attention on follow-up.  No noncontrast radiographic evidence of distant metastasis disease in the chest, abdomen or pelvis.  07/03/2021.  Follow-up visit with Dr. Berline Lopes Given that patient's mobility has improved and recovered well from her surgery.  Dr. Berline Lopes recommends adjuvant chemotherapy most likely carboplatin alone or dose reduced carboplatin and paclitaxel.  Since patient lives closer to [facility], patient was referred to establish care with [facility] for further management.  Patient has baseline dementia, she [facility].  Patient was accompanied by her daughter Alexis Mclaughlin who reports to be patient's power of attorney. Patient is a poor historian.   She denies any pain.  Per daughter, patient has recovered very well since her surgery.  She walks with a walker and participates in physical exercise with assistance at the facility.  No reports of pain or vaginal discharge, fever  or chills.  Patient appears tearful, and per daughter, they have discussed with patient about her diagnosis of " tumor", and prefers no further mentioning of " cancer" during our discussion today.  INTERVAL HISTORY Alexis Mclaughlin is a 75 y.o. female who has above history reviewed by me today presents for follow up visit for evaluation prior to adjuvant chemotherapy. Problems and complaints are listed below:  Patient was accompanied by her daughter Alexis Mclaughlin today.  Patient reports no new complaints today.  She denies being nervous.  Heart rate was 112 in the clinic.  Denies any chest pain or shortness of breath. Daughter and patient have been to chemotherapy class. Patient and daughter prefer to utilize peripheral IV for chemotherapy.  Daughter reports that patient does not drink much water today.  Review of Systems  Unable to perform ROS: Dementia  Constitutional:  Negative for fever.  Genitourinary:  Negative for vaginal bleeding and vaginal discharge.    MEDICAL HISTORY:  Past Medical History:  Diagnosis Date   Arthritis    Constipation    Dementia ([place])    Diabetes mellitus without complication ([place])    GERD (gastroesophageal reflux disease)    Hypertension    Hypothyroidism     SURGICAL HISTORY: Past Surgical History:  Procedure Laterality Date   BREAST BIOPSY Right 09/02/2018   affirm stereo/path pending   CYSTOSCOPY  06/05/2021   Procedure: CYSTOSCOPY;  Surgeon: [name], MD;  Location: [facility];  Service: Gynecology;;   DILATION AND CURETTAGE OF UTERUS N/A 06/05/2021   Procedure: DILATATION AND CURETTAGE UNDER LAPAROSCOPIC GUIDANCE;  Surgeon: [name], MD;  Location: [facility];  Service: Gynecology;  Laterality: N/A;   LAPAROSCOPY  N/A 06/05/2021   Procedure: LAPAROSCOPY OPERATIVE;  Surgeon: [name], MD;  Location: [facility];  Service: Gynecology;  Laterality: N/A;   ROBOTIC ASSISTED TOTAL HYSTERECTOMY WITH BILATERAL SALPINGO OOPHERECTOMY N/A 06/05/2021   Procedure: XI ROBOTIC ASSISTED TOTAL HYSTERECTOMY WITH BILATERAL SALPINGO OOPHORECTOMY;  Surgeon: [name], MD;  Location: [facility];  Service: Gynecology;  Laterality: N/A;   TUBAL LIGATION      SOCIAL HISTORY: Social History   Socioeconomic History   Marital status: Married    Spouse name: Not on file   Number of children: Not on file   Years of education: Not on file   Highest education level: Not on file  Occupational History   Not on file  Tobacco Use   Smoking status: Former    Types: Cigarettes   Smokeless tobacco: Never   Tobacco comments:    Quit at least 30 years ago (prior to 1990)  Vaping Use   Vaping Use: Never used  Substance and Sexual Activity   Alcohol use: Not Currently   Drug use: Never   Sexual activity: Not Currently  Other Topics Concern   Not on file  Social History Narrative   Not on file   Social Determinants of Health   Financial Resource Strain: Not on file  Food Insecurity: Not on file  Transportation Needs: Not on file  Physical Activity: Not on file  Stress: Not on file  Social Connections: Not on file  Intimate Partner Violence: Not on file    FAMILY HISTORY: Family History  Problem Relation Age of Onset   CAD Mother    CAD Father    Cancer Neg Hx     ALLERGIES:  is allergic to ace inhibitors.  MEDICATIONS:  Current Outpatient Medications  Medication Sig Dispense Refill  acetaminophen (TYLENOL) 325 MG tablet Take 2 tablets (650 mg total) by mouth every 6 (six) hours as needed for headache, fever or mild pain.     acetic acid-hydrocortisone (VOSOL-HC) OTIC solution 3 drops 4 (four) times daily.     amLODipine (NORVASC) 10 MG tablet Take 10 mg by mouth daily.     ANTI-ITCH lotion Apply  topically.     cholecalciferol (VITAMIN D3) 25 MCG (1000 UNIT) tablet Take 1,000 Units by mouth daily.     diclofenac Sodium (VOLTAREN) 1 % GEL SMARTSIG:Gram(s) Topical Twice Daily     docusate sodium (COLACE) 100 MG capsule Take 100 mg by mouth daily.     DULoxetine (CYMBALTA) 30 MG capsule Take 30 mg by mouth daily. (Patient not taking: Reported on 07/10/2021)     DULoxetine (CYMBALTA) 60 MG capsule Take 60 mg by mouth daily.     fluticasone (FLONASE) 50 MCG/ACT nasal spray Place 2 sprays into both nostrils daily.     guaiFENesin-dextromethorphan (ROBITUSSIN DM) 100-10 MG/5ML syrup Take 5 mLs by mouth every 4 (four) hours as needed for cough. 118 mL 0   insulin aspart (NOVOLOG) 100 UNIT/ML injection Inject 0-15 Units into the skin every 4 (four) hours. (Patient not taking: Reported on 07/10/2021) 10 mL 11   levothyroxine (SYNTHROID) 50 MCG tablet Take 50 mcg by mouth daily before breakfast.     lidocaine (LIDODERM) 5 % Place 2 patches onto the skin daily as needed (pain). Remove & Discard patch within 12 hours or as directed by MD     LIPITOR 80 MG tablet Take 1 tablet by mouth daily.     metFORMIN (GLUCOPHAGE-XR) 500 MG 24 hr tablet Take 500 mg by mouth 2 (two) times daily.     metoprolol succinate (TOPROL-XL) 25 MG 24 hr tablet Take 1 tablet (25 mg total) by mouth daily. 30 tablet 1   miconazole (MICOTIN) 2 % cream Apply 1 application topically at bedtime.     nystatin (MYCOSTATIN/NYSTOP) powder Apply 1 application topically 2 (two) times daily.     omeprazole (PRILOSEC) 20 MG capsule Take 20 mg by mouth daily.     ondansetron (ZOFRAN) 8 MG tablet Take 1 tablet (8 mg total) by mouth 2 (two) times daily as needed. Start on the third day after chemotherapy. 30 tablet 1   oxyCODONE (OXY IR/ROXICODONE) 5 MG immediate release tablet Take 1-2 tablets (5-10 mg total) by mouth every 4 (four) hours as needed for moderate pain. 30 tablet 0   polyethylene glycol (MIRALAX / GLYCOLAX) 17 g packet Take 17 g  by mouth 2 (two) times daily. 14 each 0   polyethylene glycol powder (GLYCOLAX/MIRALAX) 17 GM/SCOOP powder Take by mouth. (Patient not taking: Reported on 07/10/2021)     prochlorperazine (COMPAZINE) 10 MG tablet Take 1 tablet (10 mg total) by mouth every 6 (six) hours as needed (Nausea or vomiting). 30 tablet 1   sodium chloride (OCEAN) 0.65 % nasal spray Place 1 spray into the nose 2 (two) times daily as needed for congestion.     traMADol (ULTRAM) 50 MG tablet Take by mouth.     traZODone (DESYREL) 50 MG tablet Take 25 mg by mouth at bedtime.     trolamine salicylate (ASPERCREME/ALOE) 10 % cream Apply 1 application topically every 6 (six) hours as needed for muscle pain.     Witch Hazel (EQ HYGIENIC CLEANSING WIPES) PADS Apply 1 application topically 2 (two) times daily.     No current facility-administered medications for this  visit.     PHYSICAL EXAMINATION: ECOG PERFORMANCE STATUS: 2 - Symptomatic, <50% confined to bed Vitals:   07/25/21 0846  BP: (!) 153/87  Pulse: (!) 112  Resp: 18  Temp: (!) 97.3 F (36.3 C)  SpO2: 97%   Filed Weights   07/25/21 0846  Weight: 223 lb (101.2 kg)    Physical Exam Constitutional:      General: She is not in acute distress.    Appearance: She is obese.     Comments: Patient sits in a wheelchair today.  HENT:     Head: Normocephalic and atraumatic.  Eyes:     General: No scleral icterus. Cardiovascular:     Rate and Rhythm: Normal rate and regular rhythm.     Heart sounds: Normal heart sounds.  Pulmonary:     Effort: Pulmonary effort is normal. No respiratory distress.     Breath sounds: No wheezing.  Abdominal:     General: Bowel sounds are normal. There is no distension.     Palpations: Abdomen is soft.  Musculoskeletal:        General: No deformity. Normal range of motion.     Cervical back: Normal range of motion and neck supple.  Skin:    General: Skin is warm and dry.     Findings: No erythema or rash.  Neurological:      Mental Status: She is alert. Mental status is at baseline.     Cranial Nerves: No cranial nerve deficit.     Coordination: Coordination normal.     Comments: Orientated x2 name and place.    LABORATORY DATA:  I have reviewed the data as listed Lab Results  Component Value Date   WBC 13.7 (H) 07/10/2021   HGB 11.0 (L) 07/10/2021   HCT 36.4 07/10/2021   MCV 77.3 (L) 07/10/2021   PLT 424 (H) 07/10/2021   Recent Labs    06/04/21 0333 06/05/21 0328 06/06/21 0334 06/07/21 0340 07/10/21 1147  NA 136 136 138 134* 135  K 3.5 3.2* 3.5 3.8 3.9  CL 104 102 102 100 98  CO2 24 25 27 26 25   GLUCOSE 138* 147* 162* 164* 126*  BUN 18 10 12 10 13   CREATININE 0.94 0.63 0.91 0.83 0.75  CALCIUM 7.6* 7.7* 7.7* 7.6* 9.1  GFRNONAA >60 >60 >60 >60 >60  PROT 6.3* 6.5  --   --  8.1  ALBUMIN 2.3* 2.3*  --   --  3.3*  AST 51* 40  --   --  18  ALT 32 27  --   --  12  ALKPHOS 159* 146*  --   --  179*  BILITOT 0.8 0.5  --   --  0.5    Iron/TIBC/Ferritin/ %Sat    Component Value Date/Time   IRON 28 07/10/2021 1147   IRON 54 07/05/2014 0958   TIBC 249 (L) 07/10/2021 1147   TIBC 334 07/05/2014 0958   FERRITIN 180 07/10/2021 1147   FERRITIN 159 07/05/2014 0958   IRONPCTSAT 11 07/10/2021 1147   IRONPCTSAT 16 07/05/2014 0958       RADIOGRAPHIC STUDIES: I have personally reviewed the radiological images as listed and agreed with the findings in the report. CT CHEST ABDOMEN PELVIS WO CONTRAST  Result Date: 07/02/2021 CLINICAL DATA:  Uterine cancer staging, status post hysterectomy EXAM: CT CHEST, ABDOMEN AND PELVIS WITHOUT CONTRAST TECHNIQUE: Multidetector CT imaging of the chest, abdomen and pelvis was performed following the standard protocol without IV contrast. Oral enteric  contrast was administered. COMPARISON:  CT abdomen pelvis, 06/01/2021 FINDINGS: CT CHEST FINDINGS Cardiovascular: Aortic atherosclerosis. Normal heart size. No pericardial effusion. Mediastinum/Nodes: No enlarged  mediastinal, hilar, or axillary lymph nodes. Thyroid gland, trachea, and esophagus demonstrate no significant findings. Lungs/Pleura: Lungs are clear. No pleural effusion or pneumothorax. Musculoskeletal: No chest wall mass or suspicious bone lesions identified. CT ABDOMEN PELVIS FINDINGS Hepatobiliary: No solid liver abnormality is seen. No gallstones, gallbladder wall thickening, or biliary dilatation. Pancreas: Unremarkable. No pancreatic ductal dilatation or surrounding inflammatory changes. Spleen: Normal in size without significant abnormality. Adrenals/Urinary Tract: Adrenal glands are unremarkable. Exophytic cyst of the lateral midportion of the left kidney (series 2, image 59). Kidneys are otherwise normal, without renal calculi, solid lesion, or hydronephrosis. Bladder is unremarkable. Stomach/Bowel: Stomach is within normal limits. Appendix appears normal. No evidence of bowel wall thickening, distention, or inflammatory changes. Descending and sigmoid diverticulosis. Vascular/Lymphatic: Aortic atherosclerosis. Prominent low retroperitoneal, iliac, and pelvic sidewall lymph nodes are unchanged, largest left pelvic sidewall node measuring 1.9 x 0.9 cm (series 2, image 103). Reproductive: Status post hysterectomy. Other: No abdominal wall hernia or abnormality. No abdominopelvic ascites. Musculoskeletal: No acute or significant osseous findings. IMPRESSION: 1. Status post hysterectomy. 2. Prominent low retroperitoneal, iliac, and pelvic sidewall lymph nodes are unchanged, nonspecific and modestly suspicious for nodal metastatic disease. Attention on follow-up. 3. No noncontrast evidence of distant metastatic disease in the chest, abdomen, or pelvis. Aortic Atherosclerosis (ICD10-I70.0). Electronically Signed   By: Eddie Candle M.D.   On: 07/02/2021 16:04      ASSESSMENT & PLAN:  1. Malignant neoplasm of uterus, unspecified site ([place])   2. Goals of care, counseling/discussion   3. Dementia without  behavioral disturbance, unspecified dementia type ([place])   4. Anxiety attack   5. Encounter for antineoplastic chemotherapy    #Poorly differentiated neoplasm of uterus Labs reviewed and discussed with patient. Proceed with cycle 1 dose reduced carboplatin and Taxol. Per patient's daughter, [facility] will administer antiemetics per instruction if needed.  I was called to evaluate patient at infusion center around 12:00 PM today. At that time, patient is extremely agitated and she screams that she has burning sensation in her stomach.  Per nurse, patient received 500 cc IV normal saline earlier and her heart rate has improved.  Patient was assisted to go to the bathroom and once she was done, she became agitated.  Emend was running when patient became agitated, medication was immediately stopped.  Patient was evaluated by me.  She remains agitated and cursing.  She screams and stated that she did not want any more medication and wanted her peripheral IV to be taken out.  She does not allow me to do physical examination for assessment.  Chemotherapy was canceled and I called patient's daughter Alexis Mclaughlin and updated her.  Daughter requested another attempt after she talked to patient over the phone.  Patient was still not able to calm down enough to cooperate with the chemotherapy care.  Chemotherapy was canceled.  [facility] was [facility].  I had a lengthy discussion with patient's primary care provider NP Dionicia Abler over the phone.  Per PCP patient has anxiety which is well controlled with Cymbalta.  Patient usually becomes agitated when she goes to clinics or has medical procedures.  [place] [facility] [place] cancer center.  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  PCP has good relationship with patient's daughter Alexis Mclaughlin and  they communicates frequently.  Per PCP, daughter strongly desires patient to have another try.  She  has talked to patient and hopefully patient will cooperate.  Patient's daughter is a CNA and hopes to be able to sit with patient during treatments and calm her down.  [facility] is also willing to send a CNA to sit with patient if permitted by cancer center policy. Will check with cancer center administrative level to see if any exception can be made. I also mentioned my concern about patient's peripheral IV which may not last long enough for patient to complete treatment.  And establish an IV may have also irritated patient's mood.  PCP will further discuss with patient and daughter to see if they would agree with Mediport placement. Once we know about the cancer center policy, we will reschedule patient next week for another attempt of chemotherapy.  Supportive care measures are necessary for patient well-being and will be provided as necessary. We spent sufficient time to discuss many aspect of care, questions were answered to patient's satisfaction.   Microcytic anemia, hemoglobin has improved to 11. MCV 77.  Borderline iron saturation 11, decreased retic hemoglobin.  Recommend ferrous sulfate 325 mg once daily.   All questions were answered. The patient knows to call the clinic with any problems questions or concerns.  cc Dionicia Abler, NP  Return of visit: To be determined.  A total of 70 minutes was spent on this visit.  With 10 minutes spent reviewing lab reports, medication list, 15 minutes counseling the patient and daughter on the diagnosis, chemotherapy treatments,  chemotherapy instructions.  Additional 25 minutes was spent on evaluation patient in the infusion center, communicating with infusion nurse and also phone conversation with patient's daughter for update.  Additional 20 was spent discussing with patient's primary care provider over the phone and coordinating patient's care.Earlie Server, MD, PhD Hematology Oncology [facility] Pager-  [phone] 07/25/2021

## 2021-07-26 ENCOUNTER — Telehealth: Payer: Self-pay

## 2021-07-26 NOTE — Telephone Encounter (Signed)
Family/ PCP asking if family member/ CNA is allowed to be with pt during tx during agitation.   Per Doristine Johns:  We will utilized our staff to provide 1:1 care for this patient; this is in line with what we do for other patients.  Our staff has specialized training in the administration of these medications and it is best that they be allowed to do so un-interrupted  Pt has been r/s for treatment next week on 8/9. Attempted to call PCP and pt's daughter to relay information but no answer. Will try again tomorrow.

## 2021-07-27 ENCOUNTER — Ambulatory Visit: Payer: Medicare (Managed Care)

## 2021-07-27 ENCOUNTER — Other Ambulatory Visit: Payer: Medicare (Managed Care)

## 2021-07-27 ENCOUNTER — Encounter: Payer: Self-pay | Admitting: Oncology

## 2021-07-27 ENCOUNTER — Ambulatory Visit: Payer: Medicare (Managed Care) | Admitting: Oncology

## 2021-07-27 NOTE — Telephone Encounter (Signed)
PCP Dionicia Abler notified of patients rescheduled appointments.

## 2021-07-27 NOTE — Telephone Encounter (Signed)
Spoke to Dionicia Abler (PCP) to give her an update about next tx date and nursing process. She was understandable and states that she will relay message to patient's daughter. [place] states that patient has UTI and will be on Cipro for the next 7 days.  She would like a call back [phone]) from Dr. Tasia Catchings to discuss UTI and a possible port placement.

## 2021-07-27 NOTE — Telephone Encounter (Signed)
[place], will you please r/s pts tx to the following wee and notify pt's PCP Dionicia Abler [phone]) please.

## 2021-07-31 ENCOUNTER — Inpatient Hospital Stay: Payer: Medicare (Managed Care)

## 2021-07-31 ENCOUNTER — Inpatient Hospital Stay: Payer: Medicare (Managed Care) | Admitting: Oncology

## 2021-08-03 ENCOUNTER — Encounter: Payer: Self-pay | Admitting: Oncology

## 2021-08-03 ENCOUNTER — Ambulatory Visit: Payer: Medicare (Managed Care)

## 2021-08-03 ENCOUNTER — Ambulatory Visit: Payer: Medicare (Managed Care) | Admitting: Oncology

## 2021-08-03 ENCOUNTER — Other Ambulatory Visit: Payer: Medicare (Managed Care)

## 2021-08-07 ENCOUNTER — Inpatient Hospital Stay: Payer: Medicare (Managed Care)

## 2021-08-07 ENCOUNTER — Encounter: Payer: Self-pay | Admitting: Oncology

## 2021-08-07 ENCOUNTER — Inpatient Hospital Stay (HOSPITAL_BASED_OUTPATIENT_CLINIC_OR_DEPARTMENT_OTHER): Payer: Medicare (Managed Care) | Admitting: Oncology

## 2021-08-07 ENCOUNTER — Other Ambulatory Visit: Payer: Self-pay

## 2021-08-07 VITALS — BP 158/95 | HR 105 | Temp 97.7°F

## 2021-08-07 VITALS — BP 154/92 | HR 101 | Temp 97.6°F | Resp 18 | Wt 225.0 lb

## 2021-08-07 DIAGNOSIS — Z5111 Encounter for antineoplastic chemotherapy: Secondary | ICD-10-CM

## 2021-08-07 DIAGNOSIS — C55 Malignant neoplasm of uterus, part unspecified: Secondary | ICD-10-CM

## 2021-08-07 DIAGNOSIS — F0390 Unspecified dementia without behavioral disturbance: Secondary | ICD-10-CM

## 2021-08-07 DIAGNOSIS — D508 Other iron deficiency anemias: Secondary | ICD-10-CM

## 2021-08-07 LAB — CBC WITH DIFFERENTIAL/PLATELET
Abs Immature Granulocytes: 0.03 K/uL (ref 0.00–0.07)
Basophils Absolute: 0.1 K/uL (ref 0.0–0.1)
Basophils Relative: 1 %
Eosinophils Absolute: 0.3 K/uL (ref 0.0–0.5)
Eosinophils Relative: 3 %
HCT: 40.7 % (ref 36.0–46.0)
Hemoglobin: 12.2 g/dL (ref 12.0–15.0)
Immature Granulocytes: 0 %
Lymphocytes Relative: 22 %
Lymphs Abs: 2.4 K/uL (ref 0.7–4.0)
MCH: 23.4 pg — ABNORMAL LOW (ref 26.0–34.0)
MCHC: 30 g/dL (ref 30.0–36.0)
MCV: 78 fL — ABNORMAL LOW (ref 80.0–100.0)
Monocytes Absolute: 0.8 K/uL (ref 0.1–1.0)
Monocytes Relative: 7 %
Neutro Abs: 7.5 K/uL (ref 1.7–7.7)
Neutrophils Relative %: 67 %
Platelets: 360 K/uL (ref 150–400)
RBC: 5.22 MIL/uL — ABNORMAL HIGH (ref 3.87–5.11)
RDW: 17 % — ABNORMAL HIGH (ref 11.5–15.5)
WBC: 11 K/uL — ABNORMAL HIGH (ref 4.0–10.5)
nRBC: 0 % (ref 0.0–0.2)

## 2021-08-07 LAB — COMPREHENSIVE METABOLIC PANEL
ALT: 13 U/L (ref 0–44)
AST: 21 U/L (ref 15–41)
Albumin: 3.6 g/dL (ref 3.5–5.0)
Alkaline Phosphatase: 175 U/L — ABNORMAL HIGH (ref 38–126)
Anion gap: 12 (ref 5–15)
BUN: 10 mg/dL (ref 8–23)
CO2: 26 mmol/L (ref 22–32)
Calcium: 8.9 mg/dL (ref 8.9–10.3)
Chloride: 98 mmol/L (ref 98–111)
Creatinine, Ser: 0.83 mg/dL (ref 0.44–1.00)
GFR, Estimated: >60 mL/min (ref >60)
Glucose, Bld: 206 mg/dL — ABNORMAL HIGH (ref 70–99)
Potassium: 3.4 mmol/L — ABNORMAL LOW (ref 3.5–5.1)
Sodium: 136 mmol/L (ref 135–145)
Total Bilirubin: 0.7 mg/dL (ref 0.3–1.2)
Total Protein: 7.4 g/dL (ref 6.5–8.1)

## 2021-08-07 MED ORDER — LORAZEPAM 2 MG/ML IJ SOLN: 0.5000 mg | Freq: Once | INTRAMUSCULAR | Status: DC | PRN

## 2021-08-07 MED ORDER — SODIUM CHLORIDE 0.9 % IV SOLN
135.0000 mg/m2 | Freq: Once | INTRAVENOUS | Status: AC
  Administered 2021-08-07: 288 mg via INTRAVENOUS
  Filled 2021-08-07: qty 48

## 2021-08-07 MED ORDER — SODIUM CHLORIDE 0.9 % IV SOLN
10.0000 mg | Freq: Once | INTRAVENOUS | Status: AC
  Administered 2021-08-07: 10 mg via INTRAVENOUS
  Filled 2021-08-07: qty 10

## 2021-08-07 MED ORDER — PALONOSETRON HCL INJECTION 0.25 MG/5ML
0.2500 mg | Freq: Once | INTRAVENOUS | Status: AC
  Administered 2021-08-07: 0.25 mg via INTRAVENOUS
  Filled 2021-08-07: qty 5

## 2021-08-07 MED ORDER — SODIUM CHLORIDE 0.9 % IV SOLN
Freq: Once | INTRAVENOUS | Status: AC
  Filled 2021-08-07: qty 250

## 2021-08-07 MED ORDER — SODIUM CHLORIDE 0.9 % IV SOLN: 432.4000 mg | Freq: Once | INTRAVENOUS | Status: DC

## 2021-08-07 MED ORDER — SODIUM CHLORIDE 0.9 % IV SOLN
410.0000 mg | Freq: Once | INTRAVENOUS | Status: AC
  Administered 2021-08-07: 410 mg via INTRAVENOUS
  Filled 2021-08-07: qty 41

## 2021-08-07 MED ORDER — FAMOTIDINE 20 MG IN NS 100 ML IVPB
20.0000 mg | Freq: Once | INTRAVENOUS | Status: AC
  Administered 2021-08-07: 20 mg via INTRAVENOUS
  Filled 2021-08-07: qty 20

## 2021-08-07 MED ORDER — DIPHENHYDRAMINE HCL 50 MG/ML IJ SOLN
50.0000 mg | Freq: Once | INTRAMUSCULAR | Status: AC
  Administered 2021-08-07: 50 mg via INTRAVENOUS
  Filled 2021-08-07: qty 1

## 2021-08-07 MED ORDER — SODIUM CHLORIDE 0.9 % IV SOLN
150.0000 mg | Freq: Once | INTRAVENOUS | Status: AC
  Administered 2021-08-07: 150 mg via INTRAVENOUS
  Filled 2021-08-07: qty 150

## 2021-08-07 NOTE — Patient Instructions (Signed)
[facility]  Discharge Instructions: Thank you for choosing [facility] to provide your oncology and hematology care.  If you have a lab appointment with the Cancer Center, please go directly to the Cancer Center and check in at the registration area.  Wear comfortable clothing and clothing appropriate for easy access to any Portacath or PICC line.   We strive to give you quality time with your provider. You may need to reschedule your appointment if you arrive late (15 or more minutes).  Arriving late affects you and other patients whose appointments are after yours.  Also, if you miss three or more appointments without notifying the office, you may be dismissed from the clinic at the provider's discretion.      For prescription refill requests, have your pharmacy contact our office and allow 72 hours for refills to be completed.    Today you received the following chemotherapy and/or immunotherapy agents Taxol and Carboplatin       To help prevent nausea and vomiting after your treatment, we encourage you to take your nausea medication as directed.  BELOW ARE SYMPTOMS THAT SHOULD BE REPORTED IMMEDIATELY: *FEVER GREATER THAN 100.4 F (38 C) OR HIGHER *CHILLS OR SWEATING *NAUSEA AND VOMITING THAT IS NOT CONTROLLED WITH YOUR NAUSEA MEDICATION *UNUSUAL SHORTNESS OF BREATH *UNUSUAL BRUISING OR BLEEDING *URINARY PROBLEMS (pain or burning when urinating, or frequent urination) *BOWEL PROBLEMS (unusual diarrhea, constipation, pain near the anus) TENDERNESS IN MOUTH AND THROAT WITH OR WITHOUT PRESENCE OF ULCERS (sore throat, sores in mouth, or a toothache) UNUSUAL RASH, SWELLING OR PAIN  UNUSUAL VAGINAL DISCHARGE OR ITCHING   Items with * indicate a potential emergency and should be followed up as soon as possible or go to the Emergency Department if any problems should occur.  Please show the CHEMOTHERAPY ALERT CARD or IMMUNOTHERAPY ALERT CARD  at check-in to the Emergency Department and triage nurse.  Should you have questions after your visit or need to cancel or reschedule your appointment, please contact [facility]  [phone] and follow the prompts.  Office hours are 8:00 a.m. to 4:30 p.m. Monday - Friday. Please note that voicemails left after 4:00 p.m. may not be returned until the following business day.  We are closed weekends and major holidays. You have access to a nurse at all times for urgent questions. Please call the main number to the clinic [phone] and follow the prompts.  For any non-urgent questions, you may also contact your provider using MyChart. We now offer e-Visits for anyone 18 and older to request care online for non-urgent symptoms. For details visit [email or web address].   Also download the MyChart app! Go to the app store, search "MyChart", open the app, select [facility], and log in with your MyChart username and password.  Due to Covid, a mask is required upon entering the hospital/clinic. If you do not have a mask, one will be given to you upon arrival. For doctor visits, patients may have 1 support person aged 18 or older with them. For treatment visits, patients cannot have anyone with them due to current Covid guidelines and our immunocompromised population.     Paclitaxel injection What is this medication? PACLITAXEL (PAK li TAX el) is a chemotherapy drug. It targets fast dividing cells, like cancer cells, and causes these cells to die. This medicine is used to treat ovarian cancer, breast cancer, lung cancer, Kaposi's sarcoma, and other cancers. This medicine may be   used for other purposes; ask your health care provider or pharmacist if you have questions. COMMON BRAND NAME(S): Onxol, Taxol What should I tell my care team before I take this medication? They need to know if you have any of these conditions: history of irregular heartbeat liver  disease low blood counts, like low white cell, platelet, or red cell counts lung or breathing disease, like asthma tingling of the fingers or toes, or other nerve disorder an unusual or allergic reaction to paclitaxel, alcohol, polyoxyethylated castor oil, other chemotherapy, other medicines, foods, dyes, or preservatives pregnant or trying to get pregnant breast-feeding How should I use this medication? This drug is given as an infusion into a vein. It is administered in a hospital or clinic by a specially trained health care professional. Talk to your pediatrician regarding the use of this medicine in children. Special care may be needed. Overdosage: If you think you have taken too much of this medicine contact a poison control center or emergency room at once. NOTE: This medicine is only for you. Do not share this medicine with others. What if I miss a dose? It is important not to miss your dose. Call your doctor or health care professional if you are unable to keep an appointment. What may interact with this medication? Do not take this medicine with any of the following medications: live virus vaccines This medicine may also interact with the following medications: antiviral medicines for hepatitis, HIV or AIDS certain antibiotics like erythromycin and clarithromycin certain medicines for fungal infections like ketoconazole and itraconazole certain medicines for seizures like carbamazepine, phenobarbital, phenytoin gemfibrozil nefazodone rifampin St. John's wort This list may not describe all possible interactions. Give your health care provider a list of all the medicines, herbs, non-prescription drugs, or dietary supplements you use. Also tell them if you smoke, drink alcohol, or use illegal drugs. Some items may interact with your medicine. What should I watch for while using this medication? Your condition will be monitored carefully while you are receiving this medicine. You  will need important blood work done while you are taking this medicine. This medicine can cause serious allergic reactions. To reduce your risk you will need to take other medicine(s) before treatment with this medicine. If you experience allergic reactions like skin rash, itching or hives, swelling of the face, lips, or tongue, tell your doctor or health care professional right away. In some cases, you may be given additional medicines to help with side effects. Follow all directions for their use. This drug may make you feel generally unwell. This is not uncommon, as chemotherapy can affect healthy cells as well as cancer cells. Report any side effects. Continue your course of treatment even though you feel ill unless your doctor tells you to stop. Call your doctor or health care professional for advice if you get a fever, chills or sore throat, or other symptoms of a cold or flu. Do not treat yourself. This drug decreases your body's ability to fight infections. Try to avoid being around people who are sick. This medicine may increase your risk to bruise or bleed. Call your doctor or health care professional if you notice any unusual bleeding. Be careful brushing and flossing your teeth or using a toothpick because you may get an infection or bleed more easily. If you have any dental work done, tell your dentist you are receiving this medicine. Avoid taking products that contain aspirin, acetaminophen, ibuprofen, naproxen, or ketoprofen unless instructed by your doctor.   These medicines may hide a fever. Do not become pregnant while taking this medicine. Women should inform their doctor if they wish to become pregnant or think they might be pregnant. There is a potential for serious side effects to an unborn child. Talk to your health care professional or pharmacist for more information. Do not breast-feed an infant while taking this medicine. Men are advised not to father a child while receiving this  medicine. This product may contain alcohol. Ask your pharmacist or healthcare provider if this medicine contains alcohol. Be sure to tell all healthcare providers you are taking this medicine. Certain medicines, like metronidazole and disulfiram, can cause an unpleasant reaction when taken with alcohol. The reaction includes flushing, headache, nausea, vomiting, sweating, and increased thirst. The reaction can last from 30 minutes to several hours. What side effects may I notice from receiving this medication? Side effects that you should report to your doctor or health care professional as soon as possible: allergic reactions like skin rash, itching or hives, swelling of the face, lips, or tongue breathing problems changes in vision fast, irregular heartbeat high or low blood pressure mouth sores pain, tingling, numbness in the hands or feet signs of decreased platelets or bleeding - bruising, pinpoint red spots on the skin, black, tarry stools, blood in the urine signs of decreased red blood cells - unusually weak or tired, feeling faint or lightheaded, falls signs of infection - fever or chills, cough, sore throat, pain or difficulty passing urine signs and symptoms of liver injury like dark yellow or brown urine; general ill feeling or flu-like symptoms; light-colored stools; loss of appetite; nausea; right upper belly pain; unusually weak or tired; yellowing of the eyes or skin swelling of the ankles, feet, hands unusually slow heartbeat Side effects that usually do not require medical attention (report to your doctor or health care professional if they continue or are bothersome): diarrhea hair loss loss of appetite muscle or joint pain nausea, vomiting pain, redness, or irritation at site where injected tiredness This list may not describe all possible side effects. Call your doctor for medical advice about side effects. You may report side effects to FDA at 1-800-FDA-1088. Where  should I keep my medication? This drug is given in a hospital or clinic and will not be stored at home. NOTE: This sheet is a summary. It may not cover all possible information. If you have questions about this medicine, talk to your doctor, pharmacist, or health care provider.  2022 Elsevier/Gold Standard (2019-11-10 13:37:23)     Carboplatin injection What is this medication? CARBOPLATIN (KAR boe pla tin) is a chemotherapy drug. It targets fast dividing cells, like cancer cells, and causes these cells to die. This medicine is used to treat ovarian cancer and many other cancers. This medicine may be used for other purposes; ask your health care provider or pharmacist if you have questions. COMMON BRAND NAME(S): Paraplatin What should I tell my care team before I take this medication? They need to know if you have any of these conditions: blood disorders hearing problems kidney disease recent or ongoing radiation therapy an unusual or allergic reaction to carboplatin, cisplatin, other chemotherapy, other medicines, foods, dyes, or preservatives pregnant or trying to get pregnant breast-feeding How should I use this medication? This drug is usually given as an infusion into a vein. It is administered in a hospital or clinic by a specially trained health care professional. Talk to your pediatrician regarding the use of this medicine   in children. Special care may be needed. Overdosage: If you think you have taken too much of this medicine contact a poison control center or emergency room at once. NOTE: This medicine is only for you. Do not share this medicine with others. What if I miss a dose? It is important not to miss a dose. Call your doctor or health care professional if you are unable to keep an appointment. What may interact with this medication? medicines for seizures medicines to increase blood counts like filgrastim, pegfilgrastim, sargramostim some antibiotics like amikacin,  gentamicin, neomycin, streptomycin, tobramycin vaccines Talk to your doctor or health care professional before taking any of these medicines: acetaminophen aspirin ibuprofen ketoprofen naproxen This list may not describe all possible interactions. Give your health care provider a list of all the medicines, herbs, non-prescription drugs, or dietary supplements you use. Also tell them if you smoke, drink alcohol, or use illegal drugs. Some items may interact with your medicine. What should I watch for while using this medication? Your condition will be monitored carefully while you are receiving this medicine. You will need important blood work done while you are taking this medicine. This drug may make you feel generally unwell. This is not uncommon, as chemotherapy can affect healthy cells as well as cancer cells. Report any side effects. Continue your course of treatment even though you feel ill unless your doctor tells you to stop. In some cases, you may be given additional medicines to help with side effects. Follow all directions for their use. Call your doctor or health care professional for advice if you get a fever, chills or sore throat, or other symptoms of a cold or flu. Do not treat yourself. This drug decreases your body's ability to fight infections. Try to avoid being around people who are sick. This medicine may increase your risk to bruise or bleed. Call your doctor or health care professional if you notice any unusual bleeding. Be careful brushing and flossing your teeth or using a toothpick because you may get an infection or bleed more easily. If you have any dental work done, tell your dentist you are receiving this medicine. Avoid taking products that contain aspirin, acetaminophen, ibuprofen, naproxen, or ketoprofen unless instructed by your doctor. These medicines may hide a fever. Do not become pregnant while taking this medicine. Women should inform their doctor if they wish  to become pregnant or think they might be pregnant. There is a potential for serious side effects to an unborn child. Talk to your health care professional or pharmacist for more information. Do not breast-feed an infant while taking this medicine. What side effects may I notice from receiving this medication? Side effects that you should report to your doctor or health care professional as soon as possible: allergic reactions like skin rash, itching or hives, swelling of the face, lips, or tongue signs of infection - fever or chills, cough, sore throat, pain or difficulty passing urine signs of decreased platelets or bleeding - bruising, pinpoint red spots on the skin, black, tarry stools, nosebleeds signs of decreased red blood cells - unusually weak or tired, fainting spells, lightheadedness breathing problems changes in hearing changes in vision chest pain high blood pressure low blood counts - This drug may decrease the number of white blood cells, red blood cells and platelets. You may be at increased risk for infections and bleeding. nausea and vomiting pain, swelling, redness or irritation at the injection site pain, tingling, numbness in the hands or   feet problems with balance, talking, walking trouble passing urine or change in the amount of urine Side effects that usually do not require medical attention (report to your doctor or health care professional if they continue or are bothersome): hair loss loss of appetite metallic taste in the mouth or changes in taste This list may not describe all possible side effects. Call your doctor for medical advice about side effects. You may report side effects to FDA at 1-800-FDA-1088. Where should I keep my medication? This drug is given in a hospital or clinic and will not be stored at home. NOTE: This sheet is a summary. It may not cover all possible information. If you have questions about this medicine, talk to your doctor, pharmacist,  or health care provider.  2022 [employer]/Gold Standard (2008-03-15 14:38:05)  

## 2021-08-07 NOTE — Progress Notes (Signed)
Hematology/Oncology progress note [facility] Telephone:([phone] [phone]   Patient Care Team: Dionicia Abler, NP as PCP - General Clent Jacks, RN as Oncology Nurse Navigator  REFERRING PROVIDER: Dionicia Abler, NP  CHIEF COMPLAINTS/REASON FOR VISIT:  uterus neoplasm.  HISTORY OF PRESENTING ILLNESS:   Alexis Mclaughlin is a  75 y.o.  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  She was also noted to have lactic acidosis, leukocytosis, tachycardia. 06/01/2021, CT scan showed a markedly enlarged uterus with extensive fluid and air, consistent with intrauterine abscess. Blood cultures obtained on admission grew Clostridium perfringens.  She was treated with IV antibiotics, she passed a large amount of tissue vaginally on 06/02/2021-pathology showed necrotic poorly differentiated neoplasm.  She was transferred to [facility] for further management.  Patient was seen by gynecology oncology. 06/05/2021, status post robotic assisted laparoscopic total hysterectomy with bilateral salpingo-oophorectomy, excision of posterior cervix and upper vaginal, cystoscopy, D&C. Findings:  On EUA, enlarged 18cm bulbous uterus, minimally mobile. ON speculum exam, enlarged cervix almost entirely necrotic with necrotic tissue prolapsing through dilated cervical os. Necrotic tissue on D&C, no significant return of tissue and gritty texture felt almost immediately. Cervix approximately 6cm. On intra-abdominal entry, normal upper abdominal survey including liver edge, stomach, and omentum. Normal appearing small and large bowel. Uterus 18cm, bulbous at fundus. Atrophic appearing adnexa. No breach of uterine capsule by tumor. Retroperitoneum significantly fibrotic and edematous, no obvious  lymphadenopathy. After delivery of uterine specimen, significantly necrotic tissue noted along posterior colpotomy, likely with residual cervix in situ. After rectovaginal space created, additional posterior cervix excised down to level of healthy vagina. On cystoscopy, bladder dome intact, good efflux noted from bilateral ureteral orifices.   Pathology showed poorly differentiated malignant neoplasm with extensive necrosis and associated with superior of inflammation.  Tumor invades deep myometrium but does not involve serosal surface.  Cervix with separative inflammation and necrosis-no definitive evidence of tumor.  Right fallopian tube with suppurative inflammation and necrosis.  Benign unremarkable left fallopian tube and bilateral ovaries. Postop, patient gradually improves and was discharged to SNF on 06/08/2021..  06/20/2021, patient was seen by Dr. Berline Lopes for postop follow-up.  She appears physically recovering from her surgery very well. Patient's case was presented to GYN oncology tumor conference by Dr. Berline Lopes. 07/02/2021, repeat CT chest abdomen pelvis showed prominent low retroperitoneal, iliac, pelvic sidewall lymph nodes are unchanged.,  Nonspecific and modestly suspicious for nodal metastasis.  Attention on follow-up.  No noncontrast radiographic evidence of distant metastasis disease in the chest, abdomen or pelvis.  07/03/2021.  Follow-up visit with Dr. Berline Lopes Given that patient's mobility has improved and recovered well from her surgery.  Dr. Berline Lopes recommends adjuvant chemotherapy most likely carboplatin alone or dose reduced carboplatin and paclitaxel.  Since patient lives closer to [facility], patient was referred to establish care with [facility] for further management.  Patient has baseline dementia, she goes [facility].  Patient was accompanied by her daughter [name] who reports to be patient's power of attorney. Patient is a poor historian.   She denies any pain.  Per daughter, patient has recovered very well since her surgery.  She walks with a walker and participates in physical exercise with assistance at the facility.  No reports of pain or vaginal discharge, fever  or chills.  Patient appears tearful, and per daughter, they have discussed with patient about her diagnosis of " tumor", and prefers no further mentioning of " cancer" during our discussion today.  INTERVAL HISTORY Alexis Mclaughlin is a 74 y.o. female who has above history reviewed by me today presents for follow up visit for evaluation prior to adjuvant chemotherapy. Patient was accompanied by staff from [facility] program today.  Per Staff, patient took a dose of Ativan this morning, prior to coming here.  Patient answers simple questions today. Chronic right shoulder pain.  Otherwise she feels well. Denies dysuria, flank pain.   Review of Systems  Unable to perform ROS: Dementia  Constitutional:  Negative for fever.  Genitourinary:  Negative for vaginal bleeding and vaginal discharge.    MEDICAL HISTORY:  Past Medical History:  Diagnosis Date   Arthritis    Constipation    Dementia ([place])    Diabetes mellitus without complication ([place])    GERD (gastroesophageal reflux disease)    Hypertension    Hypothyroidism     SURGICAL HISTORY: Past Surgical History:  Procedure Laterality Date   BREAST BIOPSY Right 09/02/2018   affirm stereo/path pending   CYSTOSCOPY  06/05/2021   Procedure: CYSTOSCOPY;  Surgeon: [name], MD;  Location: [facility];  Service: Gynecology;;   DILATION AND CURETTAGE OF UTERUS N/A 06/05/2021   Procedure: DILATATION AND CURETTAGE UNDER LAPAROSCOPIC GUIDANCE;  Surgeon: [name], MD;  Location: [facility];  Service: Gynecology;  Laterality: N/A;   LAPAROSCOPY N/A 06/05/2021   Procedure: LAPAROSCOPY OPERATIVE;  Surgeon: [name], MD;  Location: [facility];  Service: Gynecology;  Laterality: N/A;   ROBOTIC ASSISTED TOTAL  HYSTERECTOMY WITH BILATERAL SALPINGO OOPHERECTOMY N/A 06/05/2021   Procedure: XI ROBOTIC ASSISTED TOTAL HYSTERECTOMY WITH BILATERAL SALPINGO OOPHORECTOMY;  Surgeon: [name], MD;  Location: [facility];  Service: Gynecology;  Laterality: N/A;   TUBAL LIGATION      SOCIAL HISTORY: Social History   Socioeconomic History   Marital status: Married    Spouse name: Not on file   Number of children: Not on file   Years of education: Not on file   Highest education level: Not on file  Occupational History   Not on file  Tobacco Use   Smoking status: Former    Types: Cigarettes   Smokeless tobacco: Never   Tobacco comments:    Quit at least 30 years ago (prior to 1990)  Vaping Use   Vaping Use: Never used  Substance and Sexual Activity   Alcohol use: Not Currently   Drug use: Never   Sexual activity: Not Currently  Other Topics Concern   Not on file  Social History Narrative   Not on file   Social Determinants of Health   Financial Resource Strain: Not on file  Food Insecurity: Not on file  Transportation Needs: Not on file  Physical Activity: Not on file  Stress: Not on file  Social Connections: Not on file  Intimate Partner Violence: Not on file    FAMILY HISTORY: Family History  Problem Relation Age of Onset   CAD Mother    CAD Father    Cancer Neg Hx     ALLERGIES:  is allergic to ace inhibitors.  MEDICATIONS:  Current Outpatient Medications  Medication Sig Dispense Refill   acetaminophen (TYLENOL) 325 MG tablet Take 2 tablets (650 mg total) by mouth every 6 (six) hours as needed for headache, fever or mild pain.  acetic acid-hydrocortisone (VOSOL-HC) OTIC solution 3 drops 4 (four) times daily.     amLODipine (NORVASC) 10 MG tablet Take 10 mg by mouth daily.     ANTI-ITCH lotion Apply topically.     cholecalciferol (VITAMIN D3) 25 MCG (1000 UNIT) tablet Take 1,000 Units by mouth daily.     diclofenac Sodium (VOLTAREN) 1 % GEL SMARTSIG:Gram(s) Topical  Twice Daily     docusate sodium (COLACE) 100 MG capsule Take 100 mg by mouth daily.     DULoxetine (CYMBALTA) 30 MG capsule Take 30 mg by mouth daily.     DULoxetine (CYMBALTA) 60 MG capsule Take 60 mg by mouth daily. (Patient not taking: Reported on 07/25/2021)     ferrous sulfate 325 (65 FE) MG EC tablet Take 1 tablet (325 mg total) by mouth daily. 30 tablet 1   fluticasone (FLONASE) 50 MCG/ACT nasal spray Place 2 sprays into both nostrils daily.     guaiFENesin-dextromethorphan (ROBITUSSIN DM) 100-10 MG/5ML syrup Take 5 mLs by mouth every 4 (four) hours as needed for cough. 118 mL 0   insulin aspart (NOVOLOG) 100 UNIT/ML injection Inject 0-15 Units into the skin every 4 (four) hours. (Patient not taking: No sig reported) 10 mL 11   levothyroxine (SYNTHROID) 50 MCG tablet Take 50 mcg by mouth daily before breakfast.     lidocaine (LIDODERM) 5 % Place 2 patches onto the skin daily as needed (pain). Remove & Discard patch within 12 hours or as directed by MD     LIPITOR 80 MG tablet Take 1 tablet by mouth daily.     metFORMIN (GLUCOPHAGE-XR) 500 MG 24 hr tablet Take 500 mg by mouth 2 (two) times daily.     metoprolol succinate (TOPROL-XL) 25 MG 24 hr tablet Take 1 tablet (25 mg total) by mouth daily. 30 tablet 1   miconazole (MICOTIN) 2 % cream Apply 1 application topically at bedtime.     nystatin (MYCOSTATIN/NYSTOP) powder Apply 1 application topically 2 (two) times daily.     omeprazole (PRILOSEC) 20 MG capsule Take 20 mg by mouth daily.     ondansetron (ZOFRAN) 8 MG tablet Take 1 tablet (8 mg total) by mouth 2 (two) times daily as needed. Start on the third day after chemotherapy. 30 tablet 1   oxyCODONE (OXY IR/ROXICODONE) 5 MG immediate release tablet Take 1-2 tablets (5-10 mg total) by mouth every 4 (four) hours as needed for moderate pain. 30 tablet 0   polyethylene glycol (MIRALAX / GLYCOLAX) 17 g packet Take 17 g by mouth 2 (two) times daily. 14 each 0   prochlorperazine (COMPAZINE) 10 MG  tablet Take 1 tablet (10 mg total) by mouth every 6 (six) hours as needed (Nausea or vomiting). 30 tablet 1   sodium chloride (OCEAN) 0.65 % nasal spray Place 1 spray into the nose 2 (two) times daily as needed for congestion.     traMADol (ULTRAM) 50 MG tablet Take by mouth.     traZODone (DESYREL) 50 MG tablet Take 25 mg by mouth at bedtime.     trolamine salicylate (ASPERCREME/ALOE) 10 % cream Apply 1 application topically every 6 (six) hours as needed for muscle pain.     Witch Hazel (EQ HYGIENIC CLEANSING WIPES) PADS Apply 1 application topically 2 (two) times daily.     No current facility-administered medications for this visit.     PHYSICAL EXAMINATION: ECOG PERFORMANCE STATUS: 2 - Symptomatic, <50% confined to bed Vitals:   08/07/21 0842  BP: (!) 154/92  Pulse: (!) 101  Resp: 18  Temp: 97.6 F (36.4 C)  SpO2: 98%   Filed Weights   08/07/21 0842  Weight: 225 lb (102.1 kg)    Physical Exam Constitutional:      General: She is not in acute distress.    Appearance: She is obese.     Comments: Patient sits in a wheelchair today.  HENT:     Head: Normocephalic and atraumatic.  Eyes:     General: No scleral icterus. Cardiovascular:     Rate and Rhythm: Normal rate and regular rhythm.     Heart sounds: Normal heart sounds.  Pulmonary:     Effort: Pulmonary effort is normal. No respiratory distress.     Breath sounds: No wheezing.  Abdominal:     General: Bowel sounds are normal. There is no distension.     Palpations: Abdomen is soft.  Musculoskeletal:        General: No deformity. Normal range of motion.     Cervical back: Normal range of motion and neck supple.  Skin:    General: Skin is warm and dry.     Findings: No erythema or rash.  Neurological:     Mental Status: She is alert. Mental status is at baseline.     Cranial Nerves: No cranial nerve deficit.     Coordination: Coordination normal.     Comments: Orientated x 2 name and place.     LABORATORY  DATA:  I have reviewed the data as listed Lab Results  Component Value Date   WBC 11.0 (H) 08/07/2021   HGB 12.2 08/07/2021   HCT 40.7 08/07/2021   MCV 78.0 (L) 08/07/2021   PLT 360 08/07/2021   Recent Labs    07/10/21 1147 07/25/21 0802 08/07/21 0803  NA 135 133* 136  K 3.9 3.5 3.4*  CL 98 97* 98  CO2 25 25 26   GLUCOSE 126* 185* 206*  BUN 13 14 10   CREATININE 0.75 0.77 0.83  CALCIUM 9.1 8.9 8.9  GFRNONAA >60 >60 >60  PROT 8.1 8.0 7.4  ALBUMIN 3.3* 3.5 3.6  AST 18 21 21   ALT 12 12 13   ALKPHOS 179* 181* 175*  BILITOT 0.5 0.9 0.7    Iron/TIBC/Ferritin/ %Sat    Component Value Date/Time   IRON 28 07/10/2021 1147   IRON 54 07/05/2014 0958   TIBC 249 (L) 07/10/2021 1147   TIBC 334 07/05/2014 0958   FERRITIN 180 07/10/2021 1147   FERRITIN 159 07/05/2014 0958   IRONPCTSAT 11 07/10/2021 1147   IRONPCTSAT 16 07/05/2014 0958       RADIOGRAPHIC STUDIES: I have personally reviewed the radiological images as listed and agreed with the findings in the report. No results found.    ASSESSMENT & PLAN:  1. Malignant neoplasm of uterus, unspecified site ([place])   2. Dementia without behavioral disturbance, unspecified dementia type ([place])   3. Encounter for antineoplastic chemotherapy   4. Other iron deficiency anemia    #Poorly differentiated neoplasm of uterus Labs are reviewed and discussed with patient and staff from [facility].  She was not able to do cycle 1 treatment due to agitation.  I have discussed previously with daughter and primary care provider Dionicia Abler. Per [place], they have had discussions with patient and now she is aware about her cancer status.  Patient's daughter requests another attempt off chemotherapy and see how patient does.   Labs are stable. Proceed with carboplatin AUC 4 with Taxol 135 mg/m2 today.  Ativan 0.5 mg once as needed for agitation.  Iron deficiency anemia, hemoglobin has improved Borderline iron saturation 11, decreased retic  hemoglobin.  Continue ferrous sulfate 325 mg once daily.  #Dementia, hopefully patient will cope with chemotherapy.  Managed by primary care provider   All questions were answered. The patient knows to call the clinic with any problems questions or concerns.  cc Dionicia Abler, NP  Return of visit: 1 week for evaluation of tolerability.  Earlie Server, MD, PhD Hematology Oncology [place] at [facility]  08/07/2021

## 2021-08-07 NOTE — Progress Notes (Signed)
3151: B/P 155/98, HR 103 Dr. Tasia Catchings aware. 1003: B/P 165/101 HR 96, Dr. Tasia Catchings aware. Per Dr. Tasia Catchings okay to proceed with Taxol/Carboplatin treatment as scheduled.    1600: Pt tolerated infusion well. Pt and VS stable at discharge.

## 2021-08-07 NOTE — Progress Notes (Signed)
Patient here for follow up. No new concerns voiced. Med list not avai

## 2021-08-09 ENCOUNTER — Ambulatory Visit: Payer: Medicare (Managed Care)

## 2021-08-09 ENCOUNTER — Telehealth: Payer: Self-pay

## 2021-08-09 NOTE — Telephone Encounter (Signed)
Nutrition Assessment   Reason for Assessment:  Patient identified on Malnutrition Screening report for weight loss   ASSESSMENT:  75 year old female with uterus adenocarcinoma.  Past medical history of HTN, DM, GERD, dementia, s/p hysterectomy on 6/10.  Patient receiving chemotherapy following surgery.   Spoke with Daughter [name] via phone to introduce self and service at [facility].  Daughter reports that patient has a good appetite and that has not changed even after surgery.  Reports that she receives breakfast and supper at [facility] and lunch with the [facility].  Daughter brings her food per her request, typically chicken and fish couple times a week.  Patient is eating with plastic utensils.  Daughter denies any nutrition impact symptoms   Medications: aspart, ativan, metformin, miralax, vit D, colace, fe sulfate, lasix, prilosec, zofran, compazine   Labs: reviewed   Anthropometrics:   Height: 64 inches Weight: 225 lb per [facility] on 7/24 6/17 noted 250 lb in chart (hospital wt) Dtr reports stable weight, may have lost some following surgery but has gained it back BMI: 38   NUTRITION DIAGNOSIS: none at this time   INTERVENTION:  Encouraged good nutrition during cancer treatments including good sources of protein at every meal Daughter to contact RD if has questions or concerns   MONITORING, EVALUATION, GOAL:  Weight trends, intake   Next Visit: no follow-up Dtr to contact RD as needed  Ghazal Pevey B. Zenia Resides, [place], [place] Registered Dietitian [phone] (mobile)

## 2021-08-09 NOTE — Progress Notes (Signed)
Nutrition  See phone note.  Brazil Voytko B. Zenia Resides, [place], [place] Registered Dietitian [phone] (mobile)

## 2021-08-14 ENCOUNTER — Inpatient Hospital Stay: Payer: Medicare (Managed Care)

## 2021-08-14 ENCOUNTER — Inpatient Hospital Stay (HOSPITAL_BASED_OUTPATIENT_CLINIC_OR_DEPARTMENT_OTHER): Payer: Medicare (Managed Care) | Admitting: Oncology

## 2021-08-14 ENCOUNTER — Encounter: Payer: Self-pay | Admitting: Oncology

## 2021-08-14 ENCOUNTER — Telehealth: Payer: Self-pay

## 2021-08-14 VITALS — BP 134/74 | HR 106 | Temp 98.2°F | Resp 18

## 2021-08-14 DIAGNOSIS — F0390 Unspecified dementia without behavioral disturbance: Secondary | ICD-10-CM

## 2021-08-14 DIAGNOSIS — C55 Malignant neoplasm of uterus, part unspecified: Secondary | ICD-10-CM

## 2021-08-14 DIAGNOSIS — Z5111 Encounter for antineoplastic chemotherapy: Secondary | ICD-10-CM

## 2021-08-14 DIAGNOSIS — D508 Other iron deficiency anemias: Secondary | ICD-10-CM

## 2021-08-14 LAB — COMPREHENSIVE METABOLIC PANEL
ALT: 20 U/L (ref 0–44)
AST: 23 U/L (ref 15–41)
Albumin: 3.7 g/dL (ref 3.5–5.0)
Alkaline Phosphatase: 163 U/L — ABNORMAL HIGH (ref 38–126)
Anion gap: 11 (ref 5–15)
BUN: 9 mg/dL (ref 8–23)
CO2: 25 mmol/L (ref 22–32)
Calcium: 8.4 mg/dL — ABNORMAL LOW (ref 8.9–10.3)
Chloride: 98 mmol/L (ref 98–111)
Creatinine, Ser: 0.73 mg/dL (ref 0.44–1.00)
GFR, Estimated: >60 mL/min (ref >60)
Glucose, Bld: 135 mg/dL — ABNORMAL HIGH (ref 70–99)
Potassium: 3.4 mmol/L — ABNORMAL LOW (ref 3.5–5.1)
Sodium: 134 mmol/L — ABNORMAL LOW (ref 135–145)
Total Bilirubin: 0.5 mg/dL (ref 0.3–1.2)
Total Protein: 7.7 g/dL (ref 6.5–8.1)

## 2021-08-14 LAB — CBC WITH DIFFERENTIAL/PLATELET
Abs Immature Granulocytes: 0.02 10*3/uL (ref 0.00–0.07)
Basophils Absolute: 0 10*3/uL (ref 0.0–0.1)
Basophils Relative: 1 %
Eosinophils Absolute: 0.4 10*3/uL (ref 0.0–0.5)
Eosinophils Relative: 11 %
HCT: 37.5 % (ref 36.0–46.0)
Hemoglobin: 11.7 g/dL — ABNORMAL LOW (ref 12.0–15.0)
Immature Granulocytes: 1 %
Lymphocytes Relative: 31 %
Lymphs Abs: 1.2 10*3/uL (ref 0.7–4.0)
MCH: 23.3 pg — ABNORMAL LOW (ref 26.0–34.0)
MCHC: 31.2 g/dL (ref 30.0–36.0)
MCV: 74.6 fL — ABNORMAL LOW (ref 80.0–100.0)
Monocytes Absolute: 0.2 10*3/uL (ref 0.1–1.0)
Monocytes Relative: 5 %
Neutro Abs: 2 10*3/uL (ref 1.7–7.7)
Neutrophils Relative %: 51 %
Platelets: 368 10*3/uL (ref 150–400)
RBC: 5.03 MIL/uL (ref 3.87–5.11)
RDW: 16.9 % — ABNORMAL HIGH (ref 11.5–15.5)
Smear Review: NORMAL
WBC: 3.9 10*3/uL — ABNORMAL LOW (ref 4.0–10.5)
nRBC: 0 % (ref 0.0–0.2)

## 2021-08-14 NOTE — Progress Notes (Signed)
Pt here with caregiver. No new concerns voiced.

## 2021-08-14 NOTE — Telephone Encounter (Signed)
Dr. Tasia Catchings requests for CBC to be drawn at [facility] next Tuesday and for results to be faxed to [place]. Called and spoke to Dr. Daphene Calamity to request this and she agreed to obtain lab specimen and fax results.

## 2021-08-14 NOTE — Progress Notes (Signed)
Hematology/Oncology progress note [facility] [phone] [phone]   Patient Care Team: Dionicia Abler, NP as PCP - General Clent Jacks, RN as Oncology Nurse Navigator  REFERRING PROVIDER: Dionicia Abler, NP  CHIEF COMPLAINTS/REASON FOR VISIT:  uterus neoplasm.  HISTORY OF PRESENTING ILLNESS:   Alexis Mclaughlin is a  75 y.o.  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  She was also noted to have lactic acidosis, leukocytosis, tachycardia. 06/01/2021, CT scan showed a markedly enlarged uterus with extensive fluid and air, consistent with intrauterine abscess. Blood cultures obtained on admission grew Clostridium perfringens.  She was treated with IV antibiotics, she passed a large amount of tissue vaginally on 06/02/2021-pathology showed necrotic poorly differentiated neoplasm.  She was transferred to [facility] for further management.  Patient was seen by gynecology oncology. 06/05/2021, status post robotic assisted laparoscopic total hysterectomy with bilateral salpingo-oophorectomy, excision of posterior cervix and upper vaginal, cystoscopy, D&C. Findings:  On EUA, enlarged 18cm bulbous uterus, minimally mobile. ON speculum exam, enlarged cervix almost entirely necrotic with necrotic tissue prolapsing through dilated cervical os. Necrotic tissue on D&C, no significant return of tissue and gritty texture felt almost immediately. Cervix approximately 6cm. On intra-abdominal entry, normal upper abdominal survey including liver edge, stomach, and omentum. Normal appearing small and large bowel. Uterus 18cm, bulbous at fundus. Atrophic appearing adnexa. No breach of uterine capsule by tumor. Retroperitoneum significantly fibrotic and edematous, no obvious  lymphadenopathy. After delivery of uterine specimen, significantly necrotic tissue noted along posterior colpotomy, likely with residual cervix in situ. After rectovaginal space created, additional posterior cervix excised down to level of healthy vagina. On cystoscopy, bladder dome intact, good efflux noted from bilateral ureteral orifices.   Pathology showed poorly differentiated malignant neoplasm with extensive necrosis and associated with superior of inflammation.  Tumor invades deep myometrium but does not involve serosal surface.  Cervix with separative inflammation and necrosis-no definitive evidence of tumor.  Right fallopian tube with suppurative inflammation and necrosis.  Benign unremarkable left fallopian tube and bilateral ovaries. Postop, patient gradually improves and was discharged to SNF on 06/08/2021..  06/20/2021, patient was seen by Dr. Berline Lopes for postop follow-up.  She appears physically recovering from her surgery very well. Patient's case was presented to GYN oncology tumor conference by Dr. Berline Lopes. 07/02/2021, repeat CT chest abdomen pelvis showed prominent low retroperitoneal, iliac, pelvic sidewall lymph nodes are unchanged.,  Nonspecific and modestly suspicious for nodal metastasis.  Attention on follow-up.  No noncontrast radiographic evidence of distant metastasis disease in the chest, abdomen or pelvis.  07/03/2021.  Follow-up visit with Dr. Berline Lopes Given that patient's mobility has improved and recovered well from her surgery.  Dr. Berline Lopes recommends adjuvant chemotherapy most likely carboplatin alone or dose reduced carboplatin and paclitaxel.  Since patient lives closer to [facility], patient was referred to establish care with [facility] cancer center for further management.  Patient has baseline dementia, she goes to senior care facility [facility].  Patient was accompanied by her daughter [name] who reports to be patient's power of attorney. Patient is a poor historian.   She denies any pain.  Per daughter, patient has recovered very well since her surgery.  She walks with a walker and participates in physical exercise with assistance at the facility.  No reports of pain or vaginal discharge, fever  or chills.  Patient appears tearful, and per daughter, they have discussed with patient about her diagnosis of " tumor", and prefers no further mentioning of " cancer" during our discussion today.  INTERVAL HISTORY Alexis Mclaughlin is a 75 y.o. female who has above history reviewed by me today presents for follow up visit for evaluation prior to adjuvant chemotherapy. Patient was accompanied by staff from [facility] program today.   Patient denies nausea, vomiting,  No fever, chills, dysuria.    Review of Systems  Unable to perform ROS: Dementia  Constitutional:  Negative for fever.  Genitourinary:  Negative for vaginal bleeding and vaginal discharge.    MEDICAL HISTORY:  Past Medical History:  Diagnosis Date   Arthritis    Constipation    Dementia ([place])    Diabetes mellitus without complication ([place])    GERD (gastroesophageal reflux disease)    Hypertension    Hypothyroidism     SURGICAL HISTORY: Past Surgical History:  Procedure Laterality Date   BREAST BIOPSY Right 09/02/2018   affirm stereo/path pending   CYSTOSCOPY  06/05/2021   Procedure: CYSTOSCOPY;  Surgeon: [name], MD;  Location: [facility];  Service: Gynecology;;   DILATION AND CURETTAGE OF UTERUS N/A 06/05/2021   Procedure: DILATATION AND CURETTAGE UNDER LAPAROSCOPIC GUIDANCE;  Surgeon: [name], MD;  Location: [facility];  Service: Gynecology;  Laterality: N/A;   LAPAROSCOPY N/A 06/05/2021   Procedure: LAPAROSCOPY OPERATIVE;  Surgeon: [name], MD;  Location: [facility];  Service: Gynecology;  Laterality: N/A;   ROBOTIC ASSISTED TOTAL HYSTERECTOMY WITH BILATERAL SALPINGO OOPHERECTOMY N/A 06/05/2021   Procedure: XI ROBOTIC ASSISTED TOTAL HYSTERECTOMY WITH BILATERAL SALPINGO  OOPHORECTOMY;  Surgeon: [name], MD;  Location: [facility];  Service: Gynecology;  Laterality: N/A;   TUBAL LIGATION      SOCIAL HISTORY: Social History   Socioeconomic History   Marital status: Married    Spouse name: Not on file   Number of children: Not on file   Years of education: Not on file   Highest education level: Not on file  Occupational History   Not on file  Tobacco Use   Smoking status: Former    Types: Cigarettes   Smokeless tobacco: Never   Tobacco comments:    Quit at least 30 years ago (prior to 1990)  Vaping Use   Vaping Use: Never used  Substance and Sexual Activity   Alcohol use: Not Currently   Drug use: Never   Sexual activity: Not Currently  Other Topics Concern   Not on file  Social History Narrative   Not on file   Social Determinants of Health   Financial Resource Strain: Not on file  Food Insecurity: Not on file  Transportation Needs: Not on file  Physical Activity: Not on file  Stress: Not on file  Social Connections: Not on file  Intimate Partner Violence: Not on file    FAMILY HISTORY: Family History  Problem Relation Age of Onset   CAD Mother    CAD Father    Cancer Neg Hx     ALLERGIES:  is allergic to ace inhibitors.  MEDICATIONS:  Current Outpatient Medications  Medication Sig Dispense Refill   acetaminophen (TYLENOL) 325 MG tablet Take 2 tablets (650 mg total) by mouth every 6 (six) hours as needed for headache, fever or mild pain.     ANTI-ITCH lotion Apply topically.     aspirin EC 81 MG tablet Take 81 mg by mouth daily. Swallow whole.  cholecalciferol (VITAMIN D3) 25 MCG (1000 UNIT) tablet Take 1,000 Units by mouth daily.     diclofenac Sodium (VOLTAREN) 1 % GEL SMARTSIG:Gram(s) Topical Twice Daily     docusate sodium (COLACE) 100 MG capsule Take 100 mg by mouth daily.     DULoxetine (CYMBALTA) 30 MG capsule Take 30 mg by mouth daily.     DULoxetine (CYMBALTA) 60 MG capsule Take 60 mg by mouth daily.      ferrous sulfate 325 (65 FE) MG EC tablet Take 1 tablet (325 mg total) by mouth daily. 30 tablet 1   fluticasone (FLONASE) 50 MCG/ACT nasal spray Place 2 sprays into both nostrils daily.     furosemide (LASIX) 20 MG tablet Take 20 mg by mouth.     levothyroxine (SYNTHROID) 50 MCG tablet Take 50 mcg by mouth daily before breakfast.     lidocaine (LIDODERM) 5 % Place 2 patches onto the skin daily as needed (pain). Remove & Discard patch within 12 hours or as directed by MD     LIPITOR 80 MG tablet Take 1 tablet by mouth daily.     LORazepam (ATIVAN) 0.5 MG tablet SMARTSIG:0.5 Milligram(s) By Mouth Every Morning     metFORMIN (GLUCOPHAGE-XR) 500 MG 24 hr tablet Take 500 mg by mouth 2 (two) times daily.     miconazole (MICOTIN) 2 % cream Apply 1 application topically at bedtime.     nystatin (MYCOSTATIN/NYSTOP) powder Apply 1 application topically 2 (two) times daily.     omeprazole (PRILOSEC) 20 MG capsule Take 20 mg by mouth daily.     ondansetron (ZOFRAN) 8 MG tablet Take 1 tablet (8 mg total) by mouth 2 (two) times daily as needed. Start on the third day after chemotherapy. 30 tablet 1   polyethylene glycol (MIRALAX / GLYCOLAX) 17 g packet Take 17 g by mouth 2 (two) times daily. 14 each 0   prochlorperazine (COMPAZINE) 10 MG tablet Take 1 tablet (10 mg total) by mouth every 6 (six) hours as needed (Nausea or vomiting). 30 tablet 1   sodium chloride (OCEAN) 0.65 % nasal spray Place 1 spray into the nose 2 (two) times daily as needed for congestion.     traMADol (ULTRAM) 50 MG tablet Take by mouth.     traZODone (DESYREL) 50 MG tablet Take 25 mg by mouth at bedtime.     trolamine salicylate (ASPERCREME/ALOE) 10 % cream Apply 1 application topically every 6 (six) hours as needed for muscle pain.     Witch Hazel (EQ HYGIENIC CLEANSING WIPES) PADS Apply 1 application topically 2 (two) times daily.     acetic acid-hydrocortisone (VOSOL-HC) OTIC solution 3 drops 4 (four) times daily. (Patient not  taking: No sig reported)     amLODipine (NORVASC) 10 MG tablet Take 10 mg by mouth daily. (Patient not taking: No sig reported)     ciprofloxacin (CIPRO) 500 MG tablet Take 500 mg by mouth 2 (two) times daily. (Patient not taking: No sig reported)     guaiFENesin-dextromethorphan (ROBITUSSIN DM) 100-10 MG/5ML syrup Take 5 mLs by mouth every 4 (four) hours as needed for cough. (Patient not taking: No sig reported) 118 mL 0   insulin aspart (NOVOLOG) 100 UNIT/ML injection Inject 0-15 Units into the skin every 4 (four) hours. (Patient not taking: No sig reported) 10 mL 11   metoprolol succinate (TOPROL-XL) 25 MG 24 hr tablet Take 1 tablet (25 mg total) by mouth daily. (Patient not taking: No sig reported) 30 tablet 1  oxyCODONE (OXY IR/ROXICODONE) 5 MG immediate release tablet Take 1-2 tablets (5-10 mg total) by mouth every 4 (four) hours as needed for moderate pain. (Patient not taking: No sig reported) 30 tablet 0   No current facility-administered medications for this visit.     PHYSICAL EXAMINATION: ECOG PERFORMANCE STATUS: 2 - Symptomatic, <50% confined to bed Vitals:   08/14/21 1053  BP: 134/74  Pulse: (!) 106  Resp: 18  Temp: 98.2 F (36.8 C)   There were no vitals filed for this visit.   Physical Exam Constitutional:      General: She is not in acute distress.    Appearance: She is obese.     Comments: Patient sits in a wheelchair today.  HENT:     Head: Normocephalic and atraumatic.  Eyes:     General: No scleral icterus. Cardiovascular:     Rate and Rhythm: Normal rate and regular rhythm.     Heart sounds: Normal heart sounds.  Pulmonary:     Effort: Pulmonary effort is normal. No respiratory distress.     Breath sounds: No wheezing.  Abdominal:     General: Bowel sounds are normal. There is no distension.     Palpations: Abdomen is soft.  Musculoskeletal:        General: No deformity. Normal range of motion.     Cervical back: Normal range of motion and neck  supple.  Skin:    General: Skin is warm and dry.     Findings: No erythema or rash.  Neurological:     Mental Status: She is alert. Mental status is at baseline.     Cranial Nerves: No cranial nerve deficit.     Coordination: Coordination normal.     Comments: Mildly agitated, close to her baseline.  Orientated x 2 name and place.     LABORATORY DATA:  I have reviewed the data as listed Lab Results  Component Value Date   WBC 3.9 (L) 08/14/2021   HGB 11.7 (L) 08/14/2021   HCT 37.5 08/14/2021   MCV 74.6 (L) 08/14/2021   PLT 368 08/14/2021   Recent Labs    07/25/21 0802 08/07/21 0803 08/14/21 1030  NA 133* 136 134*  K 3.5 3.4* 3.4*  CL 97* 98 98  CO2 25 26 25   GLUCOSE 185* 206* 135*  BUN 14 10 9   CREATININE 0.77 0.83 0.73  CALCIUM 8.9 8.9 8.4*  GFRNONAA >60 >60 >60  PROT 8.0 7.4 7.7  ALBUMIN 3.5 3.6 3.7  AST 21 21 23   ALT 12 13 20   ALKPHOS 181* 175* 163*  BILITOT 0.9 0.7 0.5    Iron/TIBC/Ferritin/ %Sat    Component Value Date/Time   IRON 28 07/10/2021 1147   IRON 54 07/05/2014 0958   TIBC 249 (L) 07/10/2021 1147   TIBC 334 07/05/2014 0958   FERRITIN 180 07/10/2021 1147   FERRITIN 159 07/05/2014 0958   IRONPCTSAT 11 07/10/2021 1147   IRONPCTSAT 16 07/05/2014 0958       RADIOGRAPHIC STUDIES: I have personally reviewed the radiological images as listed and agreed with the findings in the report. No results found.    ASSESSMENT & PLAN:  1. Malignant neoplasm of uterus, unspecified site ([place])   2. Dementia without behavioral disturbance, unspecified dementia type ([place])   3. Encounter for antineoplastic chemotherapy   4. Other iron deficiency anemia    #Poorly differentiated neoplasm of uterus Labs are reviewed and discussed with patient and staff from [facility].  Tolerated one  cycle of carboplatin AUC 4 with Taxol 135 mg/m2   Iron deficiency anemia, hemoglobin has improved Borderline iron saturation 11, decreased retic hemoglobin.  continue  ferrous sulfate 325 mg once daily.  #Dementia, hopefully patient will continue to cope with chemotherapy.  Managed by primary care provider # Alkaline phosphate elevation chronic.   All questions were answered. The patient knows to call the clinic with any problems questions or concerns.  cc Dionicia Abler, NP  Return of visit: 1 week cbc to be done at [facility] 2 weeks  for evaluation prior to next chemo  Earlie Server, MD, PhD Hematology Oncology [place] at [facility]  08/14/2021

## 2021-08-15 ENCOUNTER — Other Ambulatory Visit: Payer: Medicare (Managed Care)

## 2021-08-15 ENCOUNTER — Ambulatory Visit: Payer: Medicare (Managed Care)

## 2021-08-15 ENCOUNTER — Ambulatory Visit: Payer: Medicare (Managed Care) | Admitting: Oncology

## 2021-08-28 ENCOUNTER — Inpatient Hospital Stay (HOSPITAL_BASED_OUTPATIENT_CLINIC_OR_DEPARTMENT_OTHER): Payer: Medicare (Managed Care) | Admitting: Oncology

## 2021-08-28 ENCOUNTER — Encounter: Payer: Self-pay | Admitting: Oncology

## 2021-08-28 ENCOUNTER — Inpatient Hospital Stay: Payer: Medicare (Managed Care) | Attending: Oncology

## 2021-08-28 ENCOUNTER — Inpatient Hospital Stay: Payer: Medicare (Managed Care)

## 2021-08-28 VITALS — BP 160/90 | HR 78 | Temp 96.6°F | Resp 18 | Wt 243.0 lb

## 2021-08-28 DIAGNOSIS — Z5111 Encounter for antineoplastic chemotherapy: Secondary | ICD-10-CM | POA: Insufficient documentation

## 2021-08-28 DIAGNOSIS — F0390 Unspecified dementia without behavioral disturbance: Secondary | ICD-10-CM

## 2021-08-28 DIAGNOSIS — D508 Other iron deficiency anemias: Secondary | ICD-10-CM | POA: Diagnosis not present

## 2021-08-28 DIAGNOSIS — C55 Malignant neoplasm of uterus, part unspecified: Secondary | ICD-10-CM

## 2021-08-28 LAB — CBC WITH DIFFERENTIAL/PLATELET
Abs Immature Granulocytes: 0.04 10*3/uL (ref 0.00–0.07)
Basophils Absolute: 0.1 10*3/uL (ref 0.0–0.1)
Basophils Relative: 1 %
Eosinophils Absolute: 0.4 10*3/uL (ref 0.0–0.5)
Eosinophils Relative: 5 %
HCT: 43.2 % (ref 36.0–46.0)
Hemoglobin: 13.1 g/dL (ref 12.0–15.0)
Immature Granulocytes: 0 %
Lymphocytes Relative: 25 %
Lymphs Abs: 2.4 10*3/uL (ref 0.7–4.0)
MCH: 22.8 pg — ABNORMAL LOW (ref 26.0–34.0)
MCHC: 30.3 g/dL (ref 30.0–36.0)
MCV: 75.3 fL — ABNORMAL LOW (ref 80.0–100.0)
Monocytes Absolute: 1 10*3/uL (ref 0.1–1.0)
Monocytes Relative: 10 %
Neutro Abs: 5.7 10*3/uL (ref 1.7–7.7)
Neutrophils Relative %: 59 %
Platelets: 402 10*3/uL — ABNORMAL HIGH (ref 150–400)
RBC: 5.74 MIL/uL — ABNORMAL HIGH (ref 3.87–5.11)
RDW: 18.1 % — ABNORMAL HIGH (ref 11.5–15.5)
WBC: 9.7 10*3/uL (ref 4.0–10.5)
nRBC: 0 % (ref 0.0–0.2)

## 2021-08-28 LAB — COMPREHENSIVE METABOLIC PANEL
ALT: 17 U/L (ref 0–44)
AST: 28 U/L (ref 15–41)
Albumin: 3.7 g/dL (ref 3.5–5.0)
Alkaline Phosphatase: 179 U/L — ABNORMAL HIGH (ref 38–126)
Anion gap: 13 (ref 5–15)
BUN: 8 mg/dL (ref 8–23)
CO2: 25 mmol/L (ref 22–32)
Calcium: 8.2 mg/dL — ABNORMAL LOW (ref 8.9–10.3)
Chloride: 97 mmol/L — ABNORMAL LOW (ref 98–111)
Creatinine, Ser: 0.73 mg/dL (ref 0.44–1.00)
GFR, Estimated: >60 mL/min (ref >60)
Glucose, Bld: 146 mg/dL — ABNORMAL HIGH (ref 70–99)
Potassium: 3.3 mmol/L — ABNORMAL LOW (ref 3.5–5.1)
Sodium: 135 mmol/L (ref 135–145)
Total Bilirubin: 0.4 mg/dL (ref 0.3–1.2)
Total Protein: 7.7 g/dL (ref 6.5–8.1)

## 2021-08-28 MED ORDER — DIPHENHYDRAMINE HCL 50 MG/ML IJ SOLN
50.0000 mg | Freq: Once | INTRAMUSCULAR | Status: AC
  Administered 2021-08-28: 50 mg via INTRAVENOUS
  Filled 2021-08-28: qty 1

## 2021-08-28 MED ORDER — SODIUM CHLORIDE 0.9 % IV SOLN
150.0000 mg | Freq: Once | INTRAVENOUS | Status: AC
  Administered 2021-08-28: 150 mg via INTRAVENOUS
  Filled 2021-08-28: qty 150

## 2021-08-28 MED ORDER — SODIUM CHLORIDE 0.9 % IV SOLN
Freq: Once | INTRAVENOUS | Status: AC
  Filled 2021-08-28: qty 250

## 2021-08-28 MED ORDER — SODIUM CHLORIDE 0.9 % IV SOLN
486.4500 mg | Freq: Once | INTRAVENOUS | Status: AC
  Administered 2021-08-28: 490 mg via INTRAVENOUS
  Filled 2021-08-28: qty 49

## 2021-08-28 MED ORDER — SODIUM CHLORIDE 0.9 % IV SOLN
10.0000 mg | Freq: Once | INTRAVENOUS | Status: AC
  Administered 2021-08-28: 10 mg via INTRAVENOUS
  Filled 2021-08-28: qty 10

## 2021-08-28 MED ORDER — SODIUM CHLORIDE 0.9 % IV SOLN
135.0000 mg/m2 | Freq: Once | INTRAVENOUS | Status: AC
  Administered 2021-08-28: 288 mg via INTRAVENOUS
  Filled 2021-08-28: qty 48

## 2021-08-28 MED ORDER — PALONOSETRON HCL INJECTION 0.25 MG/5ML
0.2500 mg | Freq: Once | INTRAVENOUS | Status: AC
  Administered 2021-08-28: 0.25 mg via INTRAVENOUS
  Filled 2021-08-28: qty 5

## 2021-08-28 MED ORDER — LORAZEPAM 2 MG/ML IJ SOLN
0.5000 mg | Freq: Once | INTRAMUSCULAR | Status: AC
  Administered 2021-08-28: 0.5 mg via INTRAVENOUS
  Filled 2021-08-28: qty 1

## 2021-08-28 MED ORDER — FAMOTIDINE 20 MG IN NS 100 ML IVPB
20.0000 mg | Freq: Once | INTRAVENOUS | Status: AC
  Administered 2021-08-28: 20 mg via INTRAVENOUS
  Filled 2021-08-28: qty 20

## 2021-08-28 NOTE — Patient Instructions (Signed)
CANCER CENTER [place] [facility]  Discharge Instructions: Thank you for choosing [facility] to provide your oncology and hematology care.  If you have a lab appointment with the Cancer Center, please go directly to the Cancer Center and check in at the registration area.  Wear comfortable clothing and clothing appropriate for easy access to any Portacath or PICC line.   We strive to give you quality time with your provider. You may need to reschedule your appointment if you arrive late (15 or more minutes).  Arriving late affects you and other patients whose appointments are after yours.  Also, if you miss three or more appointments without notifying the office, you may be dismissed from the clinic at the provider's discretion.      For prescription refill requests, have your pharmacy contact our office and allow 72 hours for refills to be completed.    Today you received the following chemotherapy and/or immunotherapy agents Taxol & Carboplatin      To help prevent nausea and vomiting after your treatment, we encourage you to take your nausea medication as directed.  BELOW ARE SYMPTOMS THAT SHOULD BE REPORTED IMMEDIATELY: *FEVER GREATER THAN 100.4 F (38 C) OR HIGHER *CHILLS OR SWEATING *NAUSEA AND VOMITING THAT IS NOT CONTROLLED WITH YOUR NAUSEA MEDICATION *UNUSUAL SHORTNESS OF BREATH *UNUSUAL BRUISING OR BLEEDING *URINARY PROBLEMS (pain or burning when urinating, or frequent urination) *BOWEL PROBLEMS (unusual diarrhea, constipation, pain near the anus) TENDERNESS IN MOUTH AND THROAT WITH OR WITHOUT PRESENCE OF ULCERS (sore throat, sores in mouth, or a toothache) UNUSUAL RASH, SWELLING OR PAIN  UNUSUAL VAGINAL DISCHARGE OR ITCHING   Items with * indicate a potential emergency and should be followed up as soon as possible or go to the Emergency Department if any problems should occur.  Please show the CHEMOTHERAPY ALERT CARD or IMMUNOTHERAPY ALERT CARD at  check-in to the Emergency Department and triage nurse.  Should you have questions after your visit or need to cancel or reschedule your appointment, please contact [facility] [place] [facility]  [phone] and follow the prompts.  Office hours are 8:00 a.m. to 4:30 p.m. Monday - Friday. Please note that voicemails left after 4:00 p.m. may not be returned until the following business day.  We are closed weekends and major holidays. You have access to a nurse at all times for urgent questions. Please call the main number to the clinic [phone] and follow the prompts.  For any non-urgent questions, you may also contact your provider using MyChart. We now offer e-Visits for anyone 18 and older to request care online for non-urgent symptoms. For details visit [email or web address].   Also download the MyChart app! Go to the app store, search "MyChart", open the app, select [facility], and log in with your MyChart username and password.  Due to Covid, a mask is required upon entering the hospital/clinic. If you do not have a mask, one will be given to you upon arrival. For doctor visits, patients may have 1 support person aged 18 or older with them. For treatment visits, patients cannot have anyone with them due to current Covid guidelines and our immunocompromised population.  

## 2021-08-28 NOTE — Progress Notes (Signed)
Hematology/Oncology progress note [facility] Telephone:([phone] [phone]   Patient Care Team: Dionicia Abler, NP as PCP - General Clent Jacks, RN as Oncology Nurse Navigator Earlie Server, MD as Consulting Physician (Hematology and Oncology)  REFERRING PROVIDER: Dionicia Abler, NP  CHIEF COMPLAINTS/REASON FOR VISIT:  Follow-up for treatment of uterus neoplasm.  HISTORY OF PRESENTING ILLNESS:   Alexis Mclaughlin is a  75 y.o.  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  She was also noted to have lactic acidosis, leukocytosis, tachycardia. 06/01/2021, CT scan showed a markedly enlarged uterus with extensive fluid and air, consistent with intrauterine abscess. Blood cultures obtained on admission grew Clostridium perfringens.  She was treated with IV antibiotics, she passed a large amount of tissue vaginally on 06/02/2021-pathology showed necrotic poorly differentiated neoplasm.  She was transferred to [facility] for further management.  Patient was seen by gynecology oncology. 06/05/2021, status post robotic assisted laparoscopic total hysterectomy with bilateral salpingo-oophorectomy, excision of posterior cervix and upper vaginal, cystoscopy, D&C. Findings:  On EUA, enlarged 18cm bulbous uterus, minimally mobile. ON speculum exam, enlarged cervix almost entirely necrotic with necrotic tissue prolapsing through dilated cervical os. Necrotic tissue on D&C, no significant return of tissue and gritty texture felt almost immediately. Cervix approximately 6cm. On intra-abdominal entry, normal upper abdominal survey including liver edge, stomach, and omentum. Normal appearing small and large bowel. Uterus 18cm, bulbous at fundus. Atrophic appearing adnexa. No breach of uterine  capsule by tumor. Retroperitoneum significantly fibrotic and edematous, no obvious lymphadenopathy. After delivery of uterine specimen, significantly necrotic tissue noted along posterior colpotomy, likely with residual cervix in situ. After rectovaginal space created, additional posterior cervix excised down to level of healthy vagina. On cystoscopy, bladder dome intact, good efflux noted from bilateral ureteral orifices.   Pathology showed poorly differentiated malignant neoplasm with extensive necrosis and associated with superior of inflammation.  Tumor invades deep myometrium but does not involve serosal surface.  Cervix with separative inflammation and necrosis-no definitive evidence of tumor.  Right fallopian tube with suppurative inflammation and necrosis.  Benign unremarkable left fallopian tube and bilateral ovaries. Postop, patient gradually improves and was discharged to SNF on 06/08/2021..  06/20/2021, patient was seen by Dr. Berline Lopes for postop follow-up.  She appears physically recovering from her surgery very well. Patient's case was presented to GYN oncology tumor conference by Dr. Berline Lopes. 07/02/2021, repeat CT chest abdomen pelvis showed prominent low retroperitoneal, iliac, pelvic sidewall lymph nodes are unchanged.,  Nonspecific and modestly suspicious for nodal metastasis.  Attention on follow-up.  No noncontrast radiographic evidence of distant metastasis disease in the chest, abdomen or pelvis.  07/03/2021.  Follow-up visit with Dr. Berline Lopes Given that patient's mobility has improved and recovered well from her surgery.  Dr. Berline Lopes recommends adjuvant chemotherapy most likely carboplatin alone or dose reduced carboplatin and paclitaxel.  Since patient lives closer to [facility], patient was referred to establish care with [facility] cancer center for further management.  Patient has baseline dementia, she goes to senior care facility via [facility].  Patient was accompanied by her daughter  [name] who reports to be patient's power of attorney. Patient is a poor historian.  She denies any pain.  Per daughter, patient has recovered very well since her surgery.  She walks with a walker and participates in physical exercise with  assistance at the facility.  No reports of pain or vaginal discharge, fever or chills.  Patient appears tearful, and per daughter, they have discussed with patient about her diagnosis of " tumor", and prefers no further mentioning of " cancer" during our discussion today.  INTERVAL HISTORY Alexis Mclaughlin is a 75 y.o. female who has above history reviewed by me today presents for follow up visit for evaluation prior to adjuvant chemotherapy. Patient was here by herself.  She complains right shoulder pain. Otherwise no new complaints.  Denies nausea vomiting diarrhea.  Review of Systems  Unable to perform ROS: Dementia  Constitutional:  Negative for fever.  Genitourinary:  Negative for vaginal bleeding and vaginal discharge.    MEDICAL HISTORY:  Past Medical History:  Diagnosis Date   Arthritis    Constipation    Dementia ([place])    Diabetes mellitus without complication ([place])    GERD (gastroesophageal reflux disease)    Hypertension    Hypothyroidism     SURGICAL HISTORY: Past Surgical History:  Procedure Laterality Date   BREAST BIOPSY Right 09/02/2018   affirm stereo/path pending   CYSTOSCOPY  06/05/2021   Procedure: CYSTOSCOPY;  Surgeon: [name], MD;  Location: [facility];  Service: Gynecology;;   DILATION AND CURETTAGE OF UTERUS N/A 06/05/2021   Procedure: DILATATION AND CURETTAGE UNDER LAPAROSCOPIC GUIDANCE;  Surgeon: [name], MD;  Location: [facility];  Service: Gynecology;  Laterality: N/A;   LAPAROSCOPY N/A 06/05/2021   Procedure: LAPAROSCOPY OPERATIVE;  Surgeon: [name], MD;  Location: [facility];  Service: Gynecology;  Laterality: N/A;   ROBOTIC ASSISTED TOTAL HYSTERECTOMY WITH BILATERAL SALPINGO OOPHERECTOMY N/A  06/05/2021   Procedure: XI ROBOTIC ASSISTED TOTAL HYSTERECTOMY WITH BILATERAL SALPINGO OOPHORECTOMY;  Surgeon: [name], MD;  Location: [facility];  Service: Gynecology;  Laterality: N/A;   TUBAL LIGATION      SOCIAL HISTORY: Social History   Socioeconomic History   Marital status: Married    Spouse name: Not on file   Number of children: Not on file   Years of education: Not on file   Highest education level: Not on file  Occupational History   Not on file  Tobacco Use   Smoking status: Former    Types: Cigarettes   Smokeless tobacco: Never   Tobacco comments:    Quit at least 30 years ago (prior to 1990)  Vaping Use   Vaping Use: Never used  Substance and Sexual Activity   Alcohol use: Not Currently   Drug use: Never   Sexual activity: Not Currently  Other Topics Concern   Not on file  Social History Narrative   Not on file   Social Determinants of Health   Financial Resource Strain: Not on file  Food Insecurity: Not on file  Transportation Needs: Not on file  Physical Activity: Not on file  Stress: Not on file  Social Connections: Not on file  Intimate Partner Violence: Not on file    FAMILY HISTORY: Family History  Problem Relation Age of Onset   CAD Mother    CAD Father    Cancer Neg Hx     ALLERGIES:  is allergic to ace inhibitors.  MEDICATIONS:  Current Outpatient Medications  Medication Sig Dispense Refill   acetaminophen (TYLENOL) 325 MG tablet Take 2 tablets (650 mg total) by mouth every 6 (six) hours as needed for headache, fever or mild pain. (Patient not taking: Reported on 08/28/2021)     acetic acid-hydrocortisone (VOSOL-HC) OTIC  solution 3 drops 4 (four) times daily. (Patient not taking: No sig reported)     amLODipine (NORVASC) 10 MG tablet Take 10 mg by mouth daily. (Patient not taking: No sig reported)     ANTI-ITCH lotion Apply topically.     aspirin EC 81 MG tablet Take 81 mg by mouth daily. Swallow whole.     cholecalciferol  (VITAMIN D3) 25 MCG (1000 UNIT) tablet Take 1,000 Units by mouth daily.     diclofenac Sodium (VOLTAREN) 1 % GEL SMARTSIG:Gram(s) Topical Twice Daily     docusate sodium (COLACE) 100 MG capsule Take 100 mg by mouth daily.     DULoxetine (CYMBALTA) 30 MG capsule Take 30 mg by mouth daily.     DULoxetine (CYMBALTA) 60 MG capsule Take 60 mg by mouth daily.     ferrous sulfate 325 (65 FE) MG EC tablet Take 1 tablet (325 mg total) by mouth daily. 30 tablet 1   fluticasone (FLONASE) 50 MCG/ACT nasal spray Place 2 sprays into both nostrils daily.     furosemide (LASIX) 20 MG tablet Take 20 mg by mouth.     guaiFENesin-dextromethorphan (ROBITUSSIN DM) 100-10 MG/5ML syrup Take 5 mLs by mouth every 4 (four) hours as needed for cough. (Patient not taking: No sig reported) 118 mL 0   insulin aspart (NOVOLOG) 100 UNIT/ML injection Inject 0-15 Units into the skin every 4 (four) hours. (Patient not taking: No sig reported) 10 mL 11   levothyroxine (SYNTHROID) 50 MCG tablet Take 50 mcg by mouth daily before breakfast.     lidocaine (LIDODERM) 5 % Place 2 patches onto the skin daily as needed (pain). Remove & Discard patch within 12 hours or as directed by MD     LIPITOR 80 MG tablet Take 1 tablet by mouth daily.     LORazepam (ATIVAN) 0.5 MG tablet SMARTSIG:0.5 Milligram(s) By Mouth Every Morning     metFORMIN (GLUCOPHAGE-XR) 500 MG 24 hr tablet Take 500 mg by mouth 2 (two) times daily.     metoprolol succinate (TOPROL-XL) 25 MG 24 hr tablet Take 1 tablet (25 mg total) by mouth daily. (Patient not taking: No sig reported) 30 tablet 1   miconazole (MICOTIN) 2 % cream Apply 1 application topically at bedtime.     nystatin (MYCOSTATIN/NYSTOP) powder Apply 1 application topically 2 (two) times daily.     omeprazole (PRILOSEC) 20 MG capsule Take 20 mg by mouth daily.     ondansetron (ZOFRAN) 8 MG tablet Take 1 tablet (8 mg total) by mouth 2 (two) times daily as needed. Start on the third day after chemotherapy. 30  tablet 1   oxyCODONE (OXY IR/ROXICODONE) 5 MG immediate release tablet Take 1-2 tablets (5-10 mg total) by mouth every 4 (four) hours as needed for moderate pain. (Patient not taking: No sig reported) 30 tablet 0   polyethylene glycol (MIRALAX / GLYCOLAX) 17 g packet Take 17 g by mouth 2 (two) times daily. 14 each 0   prochlorperazine (COMPAZINE) 10 MG tablet Take 1 tablet (10 mg total) by mouth every 6 (six) hours as needed (Nausea or vomiting). 30 tablet 1   sodium chloride (OCEAN) 0.65 % nasal spray Place 1 spray into the nose 2 (two) times daily as needed for congestion.     traMADol (ULTRAM) 50 MG tablet Take by mouth.     traZODone (DESYREL) 50 MG tablet Take 25 mg by mouth at bedtime.     trolamine salicylate (ASPERCREME/ALOE) 10 % cream Apply 1 application  topically every 6 (six) hours as needed for muscle pain.     Witch Hazel (EQ HYGIENIC CLEANSING WIPES) PADS Apply 1 application topically 2 (two) times daily.     No current facility-administered medications for this visit.   Facility-Administered Medications Ordered in Other Visits  Medication Dose Route Frequency Provider Last Rate Last Admin   CARBOplatin (PARAPLATIN) 490 mg in sodium chloride 0.9 % 250 mL chemo infusion  490 mg Intravenous Once Earlie Server, MD       PACLitaxel (TAXOL) 288 mg in sodium chloride 0.9 % 250 mL chemo infusion (> 80mg /m2)  135 mg/m2 (Order-Specific) Intravenous Once Earlie Server, MD 99 mL/hr at 08/28/21 1113 288 mg at 08/28/21 1113     PHYSICAL EXAMINATION: ECOG PERFORMANCE STATUS: 2 - Symptomatic, <50% confined to bed Vitals:   08/28/21 0909  BP: (!) 160/90  Pulse: 78  Resp: 18  Temp: (!) 96.6 F (35.9 C)  SpO2: 98%   Filed Weights   08/28/21 0909  Weight: 243 lb (110.2 kg)     Physical Exam Constitutional:      General: She is not in acute distress.    Appearance: She is obese.     Comments: Patient sits in a wheelchair today.  HENT:     Head: Normocephalic and atraumatic.  Eyes:      General: No scleral icterus. Cardiovascular:     Rate and Rhythm: Normal rate and regular rhythm.     Heart sounds: Normal heart sounds.  Pulmonary:     Effort: Pulmonary effort is normal. No respiratory distress.     Breath sounds: No wheezing.  Abdominal:     General: Bowel sounds are normal. There is no distension.     Palpations: Abdomen is soft.  Musculoskeletal:        General: No deformity. Normal range of motion.     Cervical back: Normal range of motion and neck supple.  Skin:    General: Skin is warm and dry.     Findings: No erythema or rash.  Neurological:     Mental Status: She is alert. Mental status is at baseline.     Cranial Nerves: No cranial nerve deficit.     Coordination: Coordination normal.     Comments: Mildly agitated, close to her baseline.  Orientated x 2 name and place.     LABORATORY DATA:  I have reviewed the data as listed Lab Results  Component Value Date   WBC 9.7 08/28/2021   HGB 13.1 08/28/2021   HCT 43.2 08/28/2021   MCV 75.3 (L) 08/28/2021   PLT 402 (H) 08/28/2021   Recent Labs    08/07/21 0803 08/14/21 1030 08/28/21 0845  NA 136 134* 135  K 3.4* 3.4* 3.3*  CL 98 98 97*  CO2 26 25 25   GLUCOSE 206* 135* 146*  BUN 10 9 8   CREATININE 0.83 0.73 0.73  CALCIUM 8.9 8.4* 8.2*  GFRNONAA >60 >60 >60  PROT 7.4 7.7 7.7  ALBUMIN 3.6 3.7 3.7  AST 21 23 28   ALT 13 20 17   ALKPHOS 175* 163* 179*  BILITOT 0.7 0.5 0.4    Iron/TIBC/Ferritin/ %Sat    Component Value Date/Time   IRON 28 07/10/2021 1147   IRON 54 07/05/2014 0958   TIBC 249 (L) 07/10/2021 1147   TIBC 334 07/05/2014 0958   FERRITIN 180 07/10/2021 1147   FERRITIN 159 07/05/2014 0958   IRONPCTSAT 11 07/10/2021 1147   IRONPCTSAT 16 07/05/2014 4128  RADIOGRAPHIC STUDIES: I have personally reviewed the radiological images as listed and agreed with the findings in the report. No results found.    ASSESSMENT & PLAN:  1. Malignant neoplasm of uterus, unspecified  site ([place])   2. Dementia without behavioral disturbance, unspecified dementia type ([place])   3. Encounter for antineoplastic chemotherapy   4. Other iron deficiency anemia    #Poorly differentiated neoplasm of uterus Labs are reviewed and discussed with patient. She tolerated 1 cycle of carboplatin AUC 4 with Taxol 135 mg/m2 Counts are stable. Proceed with cycle 2 carboplatin, increase AUC to 4.5, Taxol 135 mg/m2 I was not able to reach her daughter.  I called patient's primary care provider Dionicia Abler and updated her about the plan.   Iron deficiency anemia, hemoglobin has improved Continue ferrous sulfate 325 mg once daily.  #Dementia, hopefully patient will continue to cope with chemotherapy.  Managed by primary care provider Agitation, patient received Ativan 0.5 mg x 1 prior to the chemo today. # Alkaline phosphate elevation chronic.   All questions were answered. The patient knows to call the clinic with any problems questions or concerns.  cc Dionicia Abler, NP  Return of visit:  3 weeks  for evaluation prior to next chemo  Earlie Server, MD, PhD Hematology Oncology [place] at [facility]  08/28/2021

## 2021-08-28 NOTE — Progress Notes (Signed)
Pt in for follow up, states she resides at [place].  Pt had no medication list with her and states she is unsure of what medication she is taking.  Pt also unable to stand to weigh and unable to find weight of wheelchair.

## 2021-08-29 ENCOUNTER — Ambulatory Visit: Payer: Medicare (Managed Care) | Admitting: Oncology

## 2021-08-29 ENCOUNTER — Ambulatory Visit: Payer: Medicare (Managed Care)

## 2021-08-29 ENCOUNTER — Other Ambulatory Visit: Payer: Medicare (Managed Care)

## 2021-09-04 ENCOUNTER — Ambulatory Visit
Payer: Medicare (Managed Care) | Attending: Student in an Organized Health Care Education/Training Program | Admitting: Student in an Organized Health Care Education/Training Program

## 2021-09-04 ENCOUNTER — Encounter: Payer: Self-pay | Admitting: Student in an Organized Health Care Education/Training Program

## 2021-09-04 ENCOUNTER — Other Ambulatory Visit: Payer: Self-pay

## 2021-09-04 VITALS — BP 144/75 | HR 121 | Temp 96.5°F | Resp 18 | Ht 62.0 in | Wt 243.0 lb

## 2021-09-04 DIAGNOSIS — G894 Chronic pain syndrome: Secondary | ICD-10-CM | POA: Diagnosis not present

## 2021-09-04 DIAGNOSIS — F0390 Unspecified dementia without behavioral disturbance: Secondary | ICD-10-CM | POA: Insufficient documentation

## 2021-09-04 DIAGNOSIS — M25511 Pain in right shoulder: Secondary | ICD-10-CM | POA: Diagnosis present

## 2021-09-04 DIAGNOSIS — M47816 Spondylosis without myelopathy or radiculopathy, lumbar region: Secondary | ICD-10-CM | POA: Diagnosis present

## 2021-09-04 DIAGNOSIS — G8929 Other chronic pain: Secondary | ICD-10-CM | POA: Diagnosis present

## 2021-09-04 DIAGNOSIS — G6289 Other specified polyneuropathies: Secondary | ICD-10-CM | POA: Diagnosis present

## 2021-09-04 DIAGNOSIS — C541 Malignant neoplasm of endometrium: Secondary | ICD-10-CM | POA: Insufficient documentation

## 2021-09-04 DIAGNOSIS — C55 Malignant neoplasm of uterus, part unspecified: Secondary | ICD-10-CM | POA: Insufficient documentation

## 2021-09-04 DIAGNOSIS — G629 Polyneuropathy, unspecified: Secondary | ICD-10-CM | POA: Insufficient documentation

## 2021-09-04 MED ORDER — GABAPENTIN 100 MG PO CAPS: ORAL_CAPSULE | ORAL | 0 refills | Status: AC

## 2021-09-04 NOTE — Progress Notes (Signed)
Patient: Alexis Mclaughlin  Service Category: E/M  Provider: Gillis Santa, MD  DOB: April 05, 1946  DOS: 09/04/2021  Referring Provider: Dionicia Abler, NP  MRN: [id number]  Setting: Ambulatory outpatient  PCP: Dionicia Abler, NP  Type: New Patient  Specialty: Interventional Pain Management    Location: Office  Delivery: Face-to-face     Primary Reason(s) for Visit: Encounter for initial evaluation of one or more chronic problems (new to examiner) potentially causing chronic pain, and posing a threat to normal musculoskeletal function. (Level of risk: High) CC: Back Pain (lower)  HPI  Alexis Mclaughlin is a 75 y.o. year old, female patient, who comes for the first time to our practice referred by Dionicia Abler, NP for our initial evaluation of her chronic pain. She has AKI (acute kidney injury) ([place]); Hypotension; Acute cystitis without hematuria; Chronic right shoulder pain; Hypothyroidism; Weakness; Type 2 diabetes mellitus with chronic kidney disease on chronic dialysis, without long-term current use of insulin ([place]); Acute kidney injury ([place]); Diarrhea; Hyponatremia; Hypokalemia; Chronic kidney disease; Elevated AST (SGOT); Hypocalcemia; Uterine abscess; Sepsis ([place]); Rhabdomyolysis; Frequent falls; Clostridium perfringens infection; Bacteremia; Generalized weakness; Hypertension; Hypomagnesemia; Chronic anemia; Uterine mass; Malignant neoplasm of endometrium ([place]); Malignant neoplasm of uterus ([place]); Goals of care, counseling/discussion; Dementia without behavioral disturbance ([place]); Chronic pain syndrome; Lumbar spondylosis; and Peripheral neuropathy on their problem list. Today she comes in for evaluation of her Back Pain (lower)  Pain Assessment: Location: Lower Back Radiating: n/a Onset: More than a month ago Duration:   Quality: Nagging Severity: 8 /10 (subjective, self-reported pain score)  Effect on ADL: difficulty performing daily activities Timing: Constant Modifying factors: nothing BP: (!) 144/75   HR: (!) 121  Onset and Duration: Gradual Cause of pain: Arthritis Severity: Getting worse Timing: Not influenced by the time of the day Aggravating Factors: Motion Alleviating Factors:  no alleviating factors Associated Problems: Depression, Pain that wakes patient up, and Pain that does not allow patient to sleep Quality of Pain: Throbbing Previous Examinations or Tests: The patient denies any previous exams or tests Previous Treatments: Narcotic medications and Physical Therapy  Alexis Mclaughlin is a 75 year old female, poor historian who presents with chronic pain syndrome.  She has a history of uterine cancer status post surgery and chemoradiation.  She is complaining of low back pain.  She states that this is been going on for many years.  Has gotten worse over the last couple of months.  [facility]  Patient was very tearful today and would spontaneously start crying in between statements.  When I asked her what was wrong, she kept saying "please help me".  Patient is on many medications.  She has a psychiatric history significant for dementia as well as depression.  She takes tramadol intermittently as needed.  In regards to her low back and hip pain, I recommended work-up via x-rays however the patient states that she did not want to do that.  When I attempted to ask additional questions about medications trialed, the patient became very short and asked if she could leave.  We did have a discussion about gabapentin and she was willing to trial that to help out with her lower extremity paresthesias and difficulty sleeping at night.  Other than that, I do not recommend chronic opioid therapy for her condition in the context of significant dementia.  Consider palliative care consult which may be better suited for her care.   Historic Controlled Substance Pharmacotherapy Review  Tramadol 50 mg daily prn  Historical Monitoring: The patient   reports no history of drug use. List of all UDS Test(s): No results found for: MDMA, COCAINSCRNUR, [place], [place], CANNABQUANT, [place], [place] List of other Serum/Urine Drug Screening Test(s):  No results found for: AMPHSCRSER, BARBSCRSER, BENZOSCRSER, COCAINSCRSER, COCAINSCRNUR, PCPSCRSER, PCPQUANT, THCSCRSER, THCU, CANNABQUANT, OPIATESCRSER, OXYSCRSER, PROPOXSCRSER, ETH Historical Background Evaluation: [place] PMP: PDMP not reviewed this encounter. Online review of the past 1-monthperiod conducted.              [place] Department of public safety, offender search: (Editor, commissioningInformation) Non-contributory Risk Assessment Profile: Aberrant behavior: None observed or detected today Risk factors for fatal opioid overdose:  dementia Fatal overdose hazard ratio (HR): Calculation deferred Non-fatal overdose hazard ratio (HR): Calculation deferred Risk of opioid abuse or dependence: 0.7-3.0% with doses ? 36 MME/day and 6.1-26% with doses ? 120 MME/day. Substance use disorder (SUD) risk level: See below Personal History of Substance Abuse (SUD-Substance use disorder):  Alcohol: Negative  Illegal Drugs: Negative  Rx Drugs: Negative  ORT Risk Level calculation: Low Risk  Opioid Risk Tool - 09/04/21 1003       Family History of Substance Abuse   Alcohol Negative    Illegal Drugs Negative    Rx Drugs Negative      Personal History of Substance Abuse   Alcohol Negative    Illegal Drugs Negative    Rx Drugs Negative      Age   Age between 11-45years  No      History of Preadolescent Sexual Abuse   History of Preadolescent Sexual Abuse Negative or Female      Psychological Disease   Psychological Disease Negative    Depression Positive      Total Score   Opioid Risk Tool Scoring 1    Opioid Risk Interpretation Low Risk            ORT Scoring interpretation table:  Score <3 = Low Risk for SUD  Score between 4-7 = Moderate Risk for SUD  Score >8 = High Risk for Opioid Abuse   PHQ-2  Depression Scale:  Total score: 0  PHQ-2 Scoring interpretation table: (Score and probability of major depressive disorder)  Score 0 = No depression  Score 1 = 15.4% Probability  Score 2 = 21.1% Probability  Score 3 = 38.4% Probability  Score 4 = 45.5% Probability  Score 5 = 56.4% Probability  Score 6 = 78.6% Probability   PHQ-9 Depression Scale:  Total score: 0  PHQ-9 Scoring interpretation table:  Score 0-4 = No depression  Score 5-9 = Mild depression  Score 10-14 = Moderate depression  Score 15-19 = Moderately severe depression  Score 20-27 = Severe depression (2.4 times higher risk of SUD and 2.89 times higher risk of overuse)   Pharmacologic Plan: Non-opioid analgesic therapy offered.            Initial impression: Poor candidate for opioid analgesics. Significant dementia, consider palliative care  Meds   Current Outpatient Medications:    amLODipine (NORVASC) 10 MG tablet, Take 10 mg by mouth daily., Disp: , Rfl:    gabapentin (NEURONTIN) 100 MG capsule, Take 1 capsule (100 mg total) by mouth at bedtime for 30 days, THEN 2 capsules (200 mg total) at bedtime for 30 days, THEN 3 capsules (300 mg total) at bedtime. Follow written titration schedule.., Disp: 180 capsule, Rfl: 0   Insulin Glargine (BASAGLAR KWIKPEN) 100 UNIT/ML, Inject 14 Units into the skin  daily., Disp: , Rfl:    metFORMIN (GLUCOPHAGE-XR) 500 MG 24 hr tablet, Take 500 mg by mouth 2 (two) times daily., Disp: , Rfl:    metoprolol succinate (TOPROL-XL) 25 MG 24 hr tablet, Take 1 tablet (25 mg total) by mouth daily., Disp: 30 tablet, Rfl: 1   ANTI-ITCH lotion, Apply topically., Disp: , Rfl:    aspirin EC 81 MG tablet, Take 81 mg by mouth daily. Swallow whole., Disp: , Rfl:    cholecalciferol (VITAMIN D3) 25 MCG (1000 UNIT) tablet, Take 1,000 Units by mouth daily., Disp: , Rfl:    diclofenac Sodium (VOLTAREN) 1 % GEL, SMARTSIG:Gram(s) Topical Twice Daily, Disp: , Rfl:    docusate sodium (COLACE) 100 MG capsule, Take  100 mg by mouth daily., Disp: , Rfl:    DULoxetine (CYMBALTA) 30 MG capsule, Take 30 mg by mouth daily., Disp: , Rfl:    DULoxetine (CYMBALTA) 60 MG capsule, Take 60 mg by mouth daily., Disp: , Rfl:    ferrous sulfate 325 (65 FE) MG EC tablet, Take 1 tablet (325 mg total) by mouth daily., Disp: 30 tablet, Rfl: 1   fluticasone (FLONASE) 50 MCG/ACT nasal spray, Place 2 sprays into both nostrils daily., Disp: , Rfl:    furosemide (LASIX) 20 MG tablet, Take 20 mg by mouth., Disp: , Rfl:    levothyroxine (SYNTHROID) 50 MCG tablet, Take 50 mcg by mouth daily before breakfast., Disp: , Rfl:    lidocaine (LIDODERM) 5 %, Place 2 patches onto the skin daily as needed (pain). Remove & Discard patch within 12 hours or as directed by MD, Disp: , Rfl:    LIPITOR 80 MG tablet, Take 1 tablet by mouth daily., Disp: , Rfl:    LORazepam (ATIVAN) 0.5 MG tablet, SMARTSIG:0.5 Milligram(s) By Mouth Every Morning, Disp: , Rfl:    miconazole (MICOTIN) 2 % cream, Apply 1 application topically at bedtime., Disp: , Rfl:    nystatin (MYCOSTATIN/NYSTOP) powder, Apply 1 application topically 2 (two) times daily., Disp: , Rfl:    omeprazole (PRILOSEC) 20 MG capsule, Take 20 mg by mouth daily., Disp: , Rfl:    ondansetron (ZOFRAN) 8 MG tablet, Take 1 tablet (8 mg total) by mouth 2 (two) times daily as needed. Start on the third day after chemotherapy., Disp: 30 tablet, Rfl: 1   polyethylene glycol (MIRALAX / GLYCOLAX) 17 g packet, Take 17 g by mouth 2 (two) times daily., Disp: 14 each, Rfl: 0   prochlorperazine (COMPAZINE) 10 MG tablet, Take 1 tablet (10 mg total) by mouth every 6 (six) hours as needed (Nausea or vomiting)., Disp: 30 tablet, Rfl: 1   sodium chloride (OCEAN) 0.65 % nasal spray, Place 1 spray into the nose 2 (two) times daily as needed for congestion., Disp: , Rfl:    traMADol (ULTRAM) 50 MG tablet, Take by mouth., Disp: , Rfl:    traZODone (DESYREL) 50 MG tablet, Take 25 mg by mouth at bedtime., Disp: , Rfl:     trolamine salicylate (ASPERCREME/ALOE) 10 % cream, Apply 1 application topically every 6 (six) hours as needed for muscle pain., Disp: , Rfl:    Witch Hazel (EQ HYGIENIC CLEANSING WIPES) PADS, Apply 1 application topically 2 (two) times daily., Disp: , Rfl:   Imaging Review    Narrative CLINICAL DATA:  Trauma.  Head trauma  EXAM: CT HEAD WITHOUT CONTRAST  CT CERVICAL SPINE WITHOUT CONTRAST  TECHNIQUE: Multidetector CT imaging of the head and cervical spine was performed following the standard protocol without intravenous  contrast. Multiplanar CT image reconstructions of the cervical spine were also generated.  COMPARISON:  None.  FINDINGS: CT HEAD FINDINGS  Brain: No evidence of acute infarction, hemorrhage, hydrocephalus,or mass lesion/mass effect.  Vascular: Negative  Skull: Negative for fracture  Sinuses/Orbits: No acute finding.  CT CERVICAL SPINE FINDINGS  Alignment: Reversal of cervical lordosis.  No traumatic malalignment  Skull base and vertebrae: No evidence of acute fracture. There is generalized motion artifact.  Soft tissues and spinal canal: No prevertebral fluid or swelling. No visible canal hematoma.  Disc levels:  Ordinary degenerative changes for age  Upper chest: No visible injury  IMPRESSION: 1. Negative for intracranial hemorrhage. 2. Motion degraded cervical spine CT without fracture.   Electronically Signed By: Monte Fantasia M.D. On: 06/01/2021 08:52  ROS  Cardiovascular: High blood pressure Pulmonary or Respiratory: No reported pulmonary signs or symptoms such as wheezing and difficulty taking a deep full breath (Asthma), difficulty blowing air out (Emphysema), coughing up mucus (Bronchitis), persistent dry cough, or temporary stoppage of breathing during sleep Neurological: No reported neurological signs or symptoms such as seizures, abnormal skin sensations, urinary and/or fecal incontinence, being born with an abnormal open spine  and/or a tethered spinal cord Psychological-Psychiatric: Depressed Gastrointestinal: Reflux or heatburn Genitourinary: No reported renal or genitourinary signs or symptoms such as difficulty voiding or producing urine, peeing blood, non-functioning kidney, kidney stones, difficulty emptying the bladder, difficulty controlling the flow of urine, or chronic kidney disease Hematological: No reported hematological signs or symptoms such as prolonged bleeding, low or poor functioning platelets, bruising or bleeding easily, hereditary bleeding problems, low energy levels due to low hemoglobin or being anemic Endocrine: High blood sugar controlled without the use of insulin (NIDDM) and Slow thyroid Rheumatologic: No reported rheumatological signs and symptoms such as fatigue, joint pain, tenderness, swelling, redness, heat, stiffness, decreased range of motion, with or without associated rash Musculoskeletal: Negative for myasthenia gravis, muscular dystrophy, multiple sclerosis or malignant hyperthermia Work History: Retired  Allergies  Alexis Mclaughlin is allergic to ace inhibitors.  Laboratory Chemistry Profile   Renal Lab Results  Component Value Date   BUN 8 08/28/2021   CREATININE 0.73 08/28/2021   GFRAA 49 (L) 02/14/2020   GFRNONAA >60 08/28/2021   PROTEINUR 30 (A) 06/01/2021     Electrolytes Lab Results  Component Value Date   NA 135 08/28/2021   K 3.3 (L) 08/28/2021   CL 97 (L) 08/28/2021   CALCIUM 8.2 (L) 08/28/2021   MG 1.7 06/05/2021   PHOS 3.3 06/05/2021     Hepatic Lab Results  Component Value Date   AST 28 08/28/2021   ALT 17 08/28/2021   ALBUMIN 3.7 08/28/2021   ALKPHOS 179 (H) 08/28/2021     ID Lab Results  Component Value Date   SARSCOV2NAA NEGATIVE 06/07/2021     Bone No results found for: VD25OH, VD125OH2TOT, IR5188CZ6, SA6301SW1, 25OHVITD1, 25OHVITD2, 25OHVITD3, TESTOFREE, TESTOSTERONE   Endocrine Lab Results  Component Value Date   GLUCOSE 146 (H)  08/28/2021   GLUCOSEU NEGATIVE 06/01/2021   HGBA1C 7.5 (H) 06/01/2021   TSH 6.305 (H) 02/13/2020     Neuropathy Lab Results  Component Value Date   HGBA1C 7.5 (H) 06/01/2021     CNS No results found for: COLORCSF, APPEARCSF, RBCCOUNTCSF, WBCCSF, POLYSCSF, LYMPHSCSF, EOSCSF, PROTEINCSF, GLUCCSF, JCVIRUS, CSFOLI, IGGCSF, LABACHR, ACETBL, LABACHR, ACETBL   Inflammation (CRP: Acute  ESR: Chronic) Lab Results  Component Value Date   LATICACIDVEN 2.3 ([place]) 06/01/2021     Rheumatology No  results found for: RF, ANA, LABURIC, URICUR, LYMEIGGIGMAB, LYMEABIGMQN, HLAB27   Coagulation Lab Results  Component Value Date   INR 1.2 06/04/2021   LABPROT 15.1 06/04/2021   PLT 402 (H) 08/28/2021     Cardiovascular Lab Results  Component Value Date   CKTOTAL 935 (H) 06/01/2021   HGB 13.1 08/28/2021   HCT 43.2 08/28/2021     Screening Lab Results  Component Value Date   SARSCOV2NAA NEGATIVE 06/07/2021     Cancer No results found for: CEA, CA125, LABCA2   Allergens No results found for: ALMOND, APPLE, ASPARAGUS, AVOCADO, BANANA, BARLEY, BASIL, BAYLE[place], GREENBEAN, LIMABEAN, WHITEBEAN, BEEFIGE, REDBEET, BLUEBERRY, BROCCOLI, CABBAGE, MELON, CARROT, CASEIN, CASHEWNUT, CAULIFLOWER, CELERY     Note: Lab results reviewed.  PFSH  Drug: Alexis Mclaughlin  reports no history of drug use. Alcohol:  reports that she does not currently use alcohol. Tobacco:  reports that she has quit smoking. Her smoking use included cigarettes. She has never used smokeless tobacco. Medical:  has a past medical history of Arthritis, Constipation, Dementia ([place]), Diabetes mellitus without complication ([place]), GERD (gastroesophageal reflux disease), Hypertension, and Hypothyroidism. Family: family history includes CAD in her father and mother.  Past Surgical History:  Procedure Laterality Date   BREAST BIOPSY Right 09/02/2018   affirm stereo/path pending   CYSTOSCOPY  06/05/2021   Procedure: CYSTOSCOPY;  Surgeon:  [name], MD;  Location: [facility];  Service: Gynecology;;   DILATION AND CURETTAGE OF UTERUS N/A 06/05/2021   Procedure: DILATATION AND CURETTAGE UNDER LAPAROSCOPIC GUIDANCE;  Surgeon: [name], MD;  Location: [facility];  Service: Gynecology;  Laterality: N/A;   LAPAROSCOPY N/A 06/05/2021   Procedure: LAPAROSCOPY OPERATIVE;  Surgeon: [name], MD;  Location: [facility];  Service: Gynecology;  Laterality: N/A;   ROBOTIC ASSISTED TOTAL HYSTERECTOMY WITH BILATERAL SALPINGO OOPHERECTOMY N/A 06/05/2021   Procedure: XI ROBOTIC ASSISTED TOTAL HYSTERECTOMY WITH BILATERAL SALPINGO OOPHORECTOMY;  Surgeon: [name], MD;  Location: [facility];  Service: Gynecology;  Laterality: N/A;   TUBAL LIGATION     Active Ambulatory Problems    Diagnosis Date Noted   AKI (acute kidney injury) ([place]) 02/09/2020   Hypotension    Acute cystitis without hematuria    Chronic right shoulder pain    Hypothyroidism    Weakness    Type 2 diabetes mellitus with chronic kidney disease on chronic dialysis, without long-term current use of insulin ([facility])    Acute kidney injury ([place]) 02/10/2020   Diarrhea    Hyponatremia 06/01/2021   Hypokalemia 06/01/2021   Chronic kidney disease 06/01/2021   Elevated AST (SGOT) 06/01/2021   Hypocalcemia 06/01/2021   Uterine abscess 06/01/2021   Sepsis ([place]) 06/01/2021   Rhabdomyolysis 06/01/2021   Frequent falls 06/01/2021   Clostridium perfringens infection 06/04/2021   Bacteremia 06/04/2021   Generalized weakness 06/04/2021   Hypertension    Hypomagnesemia    Chronic anemia    Uterine mass 06/05/2021   Malignant neoplasm of endometrium ([place]) 07/03/2021   Malignant neoplasm of uterus ([place]) 07/10/2021   Goals of care, counseling/discussion 07/10/2021   Dementia without behavioral disturbance ([place]) 07/10/2021   Chronic pain syndrome 09/04/2021   Lumbar spondylosis 09/04/2021   Peripheral neuropathy 09/04/2021   Resolved Ambulatory Problems     Diagnosis Date Noted   No Resolved Ambulatory Problems   Past Medical History:  Diagnosis Date   Arthritis    Constipation    Dementia ([place])    Diabetes mellitus without complication ([place])  GERD (gastroesophageal reflux disease)    Constitutional Exam  General appearance: alert, slowed mentation, and pale Vitals:   09/04/21 0952  BP: (!) 144/75  Pulse: (!) 121  Resp: 18  Temp: (!) 96.5 F (35.8 C)  TempSrc: Temporal  SpO2: 100%  Weight: 243 lb (110.2 kg)  Height: 5' 2"  (1.575 m)   BMI Assessment: Estimated body mass index is 44.45 kg/m as calculated from the following:   Height as of this encounter: 5' 2"  (1.575 m).   Weight as of this encounter: 243 lb (110.2 kg).  BMI interpretation table: BMI level Category Range association with higher incidence of chronic pain  <18 kg/m2 Underweight   18.5-24.9 kg/m2 Ideal body weight   25-29.9 kg/m2 Overweight Increased incidence by 20%  30-34.9 kg/m2 Obese (Class I) Increased incidence by 68%  35-39.9 kg/m2 Severe obesity (Class II) Increased incidence by 136%  >40 kg/m2 Extreme obesity (Class III) Increased incidence by 254%   Patient's current BMI Ideal Body weight  Body mass index is 44.45 kg/m. Ideal body weight: 50.1 kg (110 lb 7.2 oz) Adjusted ideal body weight: 74.1 kg (163 lb 7.5 oz)   BMI Readings from Last 4 Encounters:  09/04/21 44.45 kg/m  08/28/21 41.71 kg/m  08/07/21 38.62 kg/m  07/25/21 38.28 kg/m   Wt Readings from Last 4 Encounters:  09/04/21 243 lb (110.2 kg)  08/28/21 243 lb (110.2 kg)  08/07/21 225 lb (102.1 kg)  07/25/21 223 lb (101.2 kg)    Psych/Mental status:  dementia        Eyes: PERLA Respiratory: No evidence of acute respiratory distress  Upper Extremity (UE) Exam    Side: Right upper extremity  Side: Left upper extremity  Skin & Extremity Inspection: Skin color, temperature, and hair growth are WNL. No peripheral edema or cyanosis. No masses, redness, swelling, asymmetry, or  associated skin lesions. No contractures.  Skin & Extremity Inspection: Skin color, temperature, and hair growth are WNL. No peripheral edema or cyanosis. No masses, redness, swelling, asymmetry, or associated skin lesions. No contractures.  Functional ROM: Pain restricted ROM          Functional ROM: Pain restricted ROM          Muscle Tone/Strength: Functionally intact. No obvious neuro-muscular anomalies detected.  Muscle Tone/Strength: Functionally intact. No obvious neuro-muscular anomalies detected.  Sensory (Neurological): Arthropathic arthralgia          Sensory (Neurological): Arthropathic arthralgia          Palpation: No palpable anomalies              Palpation: No palpable anomalies              Provocative Test(s):  Phalen's test: deferred Tinel's test: deferred Apley's scratch test (touch opposite shoulder):  Action 1 (Across chest): Decreased ROM Action 2 (Overhead): Decreased ROM Action 3 (LB reach): Decreased ROM   Provocative Test(s):  Phalen's test: deferred Tinel's test: deferred Apley's scratch test (touch opposite shoulder):  Action 1 (Across chest): Decreased ROM Action 2 (Overhead): Decreased ROM Action 3 (LB reach): Decreased ROM    Thoracic Spine Area Exam  Skin & Axial Inspection: Paravertebral muscle atrophy Alignment: Symmetrical Functional ROM: Decreased ROM Stability: No instability detected Muscle Tone/Strength: Functionally intact. No obvious neuro-muscular anomalies detected. Sensory (Neurological): Dermatomal pain pattern Muscle strength & Tone: No palpable anomalies Lumbar Spine Area Exam  Skin & Axial Inspection: No masses, redness, or swelling Alignment: Symmetrical Functional ROM: Decreased ROM affecting both sides Stability: No  instability detected Muscle Tone/Strength: Functionally intact. No obvious neuro-muscular anomalies detected. Sensory (Neurological): Neuropathic pain pattern & MSK  Gait & Posture Assessment  Ambulation: Patient  came in today in a wheel chair Gait: Limited. Using assistive device to ambulate Posture: Difficulty standing up straight, due to pain   Lower Extremity Exam    Side: Right lower extremity  Side: Left lower extremity  Stability: No instability observed          Stability: No instability observed          Skin & Extremity Inspection: Venous stasis edema  Skin & Extremity Inspection: Venous stasis edema  Functional ROM: Decreased ROM                  Functional ROM: Decreased ROM                  Muscle Tone/Strength: Functionally intact. No obvious neuro-muscular anomalies detected.  Muscle Tone/Strength: Functionally intact. No obvious neuro-muscular anomalies detected.  Sensory (Neurological): Musculoskeletal pain pattern        Sensory (Neurological): Musculoskeletal pain pattern          Assessment  Primary Diagnosis & Pertinent Problem List: The primary encounter diagnosis was Chronic pain syndrome. Diagnoses of Lumbar facet arthropathy, Lumbar spondylosis, Other polyneuropathy, Dementia without behavioral disturbance, unspecified dementia type ([place]), Chronic right shoulder pain, Malignant neoplasm of endometrium ([place]), and Malignant neoplasm of uterus, unspecified site [facility]) were also pertinent to this visit.  Visit Diagnosis (New problems to examiner): 1. Chronic pain syndrome   2. Lumbar facet arthropathy   3. Lumbar spondylosis   4. Other polyneuropathy   5. Dementia without behavioral disturbance, unspecified dementia type ([place])   6. Chronic right shoulder pain   7. Malignant neoplasm of endometrium ([place])   8. Malignant neoplasm of uterus, unspecified site [facility])    Plan of Care (Initial workup plan)   Lab Orders         Compliance Drug Analysis, Ur      Patient declined to complete urine screen as well as x-rays.  She was not interested in a treatment plan for chronic pain syndrome that includes physical therapy, interventional options.  She was very tearful and wanted to  leave during the course of her visit.  I recommend she consult with palliative care for her condition. If she would like to consider spinal injections for her low back pain, she can follow-up with [place] at that time.  Pharmacotherapy (current): Medications ordered:  Meds ordered this encounter  Medications   gabapentin (NEURONTIN) 100 MG capsule    Sig: Take 1 capsule (100 mg total) by mouth at bedtime for 30 days, THEN 2 capsules (200 mg total) at bedtime for 30 days, THEN 3 capsules (300 mg total) at bedtime. Follow written titration schedule.Marland Kitchen    Dispense:  180 capsule    Refill:  0    Fill one day early if pharmacy is closed on scheduled refill date. May substitute for generic if available.   Medications administered during this visit: Alexis Mclaughlin had no medications administered during this visit.    Provider-requested follow-up: Return if symptoms worsen or fail to improve.  Future Appointments  Date Time Provider [place]  09/18/2021  8:15 AM [facility] None  09/18/2021  8:30 AM Earlie Server, MD [facility] None  09/18/2021  9:00 AM [facility] INFUSION CHAIR 8 [facility] None   I spent a total of 60 minutes reviewing chart data, face-to-face evaluation with  the patient, counseling and coordination of care as detailed above.   Note by: Gillis Santa, MD Date: 09/04/2021; Time: 10:29 AM

## 2021-09-04 NOTE — Progress Notes (Signed)
Safety precautions to be maintained throughout the outpatient stay will include: orient to surroundings, keep bed in low position, maintain call bell within reach at all times, provide assistance with transfer out of bed and ambulation.  

## 2021-09-18 ENCOUNTER — Inpatient Hospital Stay (HOSPITAL_BASED_OUTPATIENT_CLINIC_OR_DEPARTMENT_OTHER): Payer: Medicare (Managed Care) | Admitting: Oncology

## 2021-09-18 ENCOUNTER — Inpatient Hospital Stay: Payer: Medicare (Managed Care)

## 2021-09-18 ENCOUNTER — Encounter: Payer: Self-pay | Admitting: Oncology

## 2021-09-18 VITALS — BP 116/74 | HR 108 | Temp 97.8°F | Resp 20

## 2021-09-18 DIAGNOSIS — C55 Malignant neoplasm of uterus, part unspecified: Secondary | ICD-10-CM

## 2021-09-18 DIAGNOSIS — F0390 Unspecified dementia without behavioral disturbance: Secondary | ICD-10-CM | POA: Diagnosis not present

## 2021-09-18 DIAGNOSIS — Z5111 Encounter for antineoplastic chemotherapy: Secondary | ICD-10-CM | POA: Diagnosis not present

## 2021-09-18 DIAGNOSIS — D508 Other iron deficiency anemias: Secondary | ICD-10-CM | POA: Diagnosis not present

## 2021-09-18 DIAGNOSIS — R7989 Other specified abnormal findings of blood chemistry: Secondary | ICD-10-CM

## 2021-09-18 LAB — COMPREHENSIVE METABOLIC PANEL
ALT: 19 U/L (ref 0–44)
AST: 30 U/L (ref 15–41)
Albumin: 3.7 g/dL (ref 3.5–5.0)
Alkaline Phosphatase: 169 U/L — ABNORMAL HIGH (ref 38–126)
Anion gap: 13 (ref 5–15)
BUN: 14 mg/dL (ref 8–23)
CO2: 22 mmol/L (ref 22–32)
Calcium: 8.7 mg/dL — ABNORMAL LOW (ref 8.9–10.3)
Chloride: 100 mmol/L (ref 98–111)
Creatinine, Ser: 1.06 mg/dL — ABNORMAL HIGH (ref 0.44–1.00)
GFR, Estimated: 54 mL/min — ABNORMAL LOW (ref >60)
Glucose, Bld: 144 mg/dL — ABNORMAL HIGH (ref 70–99)
Potassium: 4.2 mmol/L (ref 3.5–5.1)
Sodium: 135 mmol/L (ref 135–145)
Total Bilirubin: 0.4 mg/dL (ref 0.3–1.2)
Total Protein: 7.2 g/dL (ref 6.5–8.1)

## 2021-09-18 LAB — CBC WITH DIFFERENTIAL/PLATELET
Abs Immature Granulocytes: 0.04 10*3/uL (ref 0.00–0.07)
Basophils Absolute: 0.1 10*3/uL (ref 0.0–0.1)
Basophils Relative: 1 %
Eosinophils Absolute: 0.1 10*3/uL (ref 0.0–0.5)
Eosinophils Relative: 1 %
HCT: 43.5 % (ref 36.0–46.0)
Hemoglobin: 13 g/dL (ref 12.0–15.0)
Immature Granulocytes: 1 %
Lymphocytes Relative: 27 %
Lymphs Abs: 2.3 10*3/uL (ref 0.7–4.0)
MCH: 23.4 pg — ABNORMAL LOW (ref 26.0–34.0)
MCHC: 29.9 g/dL — ABNORMAL LOW (ref 30.0–36.0)
MCV: 78.4 fL — ABNORMAL LOW (ref 80.0–100.0)
Monocytes Absolute: 0.9 10*3/uL (ref 0.1–1.0)
Monocytes Relative: 10 %
Neutro Abs: 5.4 10*3/uL (ref 1.7–7.7)
Neutrophils Relative %: 60 %
Platelets: 348 10*3/uL (ref 150–400)
RBC: 5.55 MIL/uL — ABNORMAL HIGH (ref 3.87–5.11)
RDW: 19.5 % — ABNORMAL HIGH (ref 11.5–15.5)
WBC: 8.8 10*3/uL (ref 4.0–10.5)
nRBC: 0 % (ref 0.0–0.2)

## 2021-09-18 MED ORDER — PALONOSETRON HCL INJECTION 0.25 MG/5ML
0.2500 mg | Freq: Once | INTRAVENOUS | Status: AC
  Administered 2021-09-18: 0.25 mg via INTRAVENOUS
  Filled 2021-09-18: qty 5

## 2021-09-18 MED ORDER — SODIUM CHLORIDE 0.9 % IV SOLN
490.0000 mg | Freq: Once | INTRAVENOUS | Status: AC
  Administered 2021-09-18: 490 mg via INTRAVENOUS
  Filled 2021-09-18: qty 49

## 2021-09-18 MED ORDER — SODIUM CHLORIDE 0.9 % IV SOLN
Freq: Once | INTRAVENOUS | Status: AC
  Filled 2021-09-18: qty 250

## 2021-09-18 MED ORDER — SODIUM CHLORIDE 0.9 % IV SOLN
150.0000 mg | Freq: Once | INTRAVENOUS | Status: AC
  Administered 2021-09-18: 150 mg via INTRAVENOUS
  Filled 2021-09-18: qty 5

## 2021-09-18 MED ORDER — DIPHENHYDRAMINE HCL 50 MG/ML IJ SOLN
50.0000 mg | Freq: Once | INTRAMUSCULAR | Status: AC
  Administered 2021-09-18: 50 mg via INTRAVENOUS
  Filled 2021-09-18: qty 1

## 2021-09-18 MED ORDER — SODIUM CHLORIDE 0.9 % IV SOLN
10.0000 mg | Freq: Once | INTRAVENOUS | Status: AC
  Administered 2021-09-18: 10 mg via INTRAVENOUS
  Filled 2021-09-18: qty 1

## 2021-09-18 MED ORDER — LORAZEPAM 2 MG/ML IJ SOLN
0.5000 mg | Freq: Once | INTRAMUSCULAR | Status: AC
  Administered 2021-09-18: 0.5 mg via INTRAVENOUS
  Filled 2021-09-18: qty 1

## 2021-09-18 MED ORDER — SODIUM CHLORIDE 0.9 % IV SOLN
135.0000 mg/m2 | Freq: Once | INTRAVENOUS | Status: AC
  Administered 2021-09-18: 288 mg via INTRAVENOUS
  Filled 2021-09-18: qty 48

## 2021-09-18 MED ORDER — FAMOTIDINE 20 MG IN NS 100 ML IVPB
20.0000 mg | Freq: Once | INTRAVENOUS | Status: AC
  Administered 2021-09-18: 20 mg via INTRAVENOUS
  Filled 2021-09-18: qty 20

## 2021-09-18 NOTE — Progress Notes (Signed)
Per md ok to treat for HR

## 2021-09-18 NOTE — Progress Notes (Signed)
Hematology/Oncology progress note [facility] [phone] [phone]   Patient Care Team: Dionicia Abler, NP as PCP - General Clent Jacks, RN as Oncology Nurse Navigator Earlie Server, MD as Consulting Physician (Hematology and Oncology)  REFERRING PROVIDER: Dionicia Abler, NP  CHIEF COMPLAINTS/REASON FOR VISIT:  Follow-up for treatment of uterus neoplasm.  HISTORY OF PRESENTING ILLNESS:   Alexis Mclaughlin is a  75 y.o.  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  She was also noted to have lactic acidosis, leukocytosis, tachycardia. 06/01/2021, CT scan showed a markedly enlarged uterus with extensive fluid and air, consistent with intrauterine abscess. Blood cultures obtained on admission grew Clostridium perfringens.  She was treated with IV antibiotics, she passed a large amount of tissue vaginally on 06/02/2021-pathology showed necrotic poorly differentiated neoplasm.  She was transferred to [facility] for further management.  Patient was seen by gynecology oncology. 06/05/2021, status post robotic assisted laparoscopic total hysterectomy with bilateral salpingo-oophorectomy, excision of posterior cervix and upper vaginal, cystoscopy, D&C. Findings:  On EUA, enlarged 18cm bulbous uterus, minimally mobile. ON speculum exam, enlarged cervix almost entirely necrotic with necrotic tissue prolapsing through dilated cervical os. Necrotic tissue on D&C, no significant return of tissue and gritty texture felt almost immediately. Cervix approximately 6cm. On intra-abdominal entry, normal upper abdominal survey including liver edge, stomach, and omentum. Normal appearing small and large bowel. Uterus 18cm, bulbous at fundus. Atrophic appearing adnexa. No breach of uterine  capsule by tumor. Retroperitoneum significantly fibrotic and edematous, no obvious lymphadenopathy. After delivery of uterine specimen, significantly necrotic tissue noted along posterior colpotomy, likely with residual cervix in situ. After rectovaginal space created, additional posterior cervix excised down to level of healthy vagina. On cystoscopy, bladder dome intact, good efflux noted from bilateral ureteral orifices.   Pathology showed poorly differentiated malignant neoplasm with extensive necrosis and associated with superior of inflammation.  Tumor invades deep myometrium but does not involve serosal surface.  Cervix with separative inflammation and necrosis-no definitive evidence of tumor.  Right fallopian tube with suppurative inflammation and necrosis.  Benign unremarkable left fallopian tube and bilateral ovaries. Postop, patient gradually improves and was discharged to SNF on 06/08/2021..  06/20/2021, patient was seen by Dr. Berline Lopes for postop follow-up.  She appears physically recovering from her surgery very well. Patient's case was presented to GYN oncology tumor conference by Dr. Berline Lopes. 07/02/2021, repeat CT chest abdomen pelvis showed prominent low retroperitoneal, iliac, pelvic sidewall lymph nodes are unchanged.,  Nonspecific and modestly suspicious for nodal metastasis.  Attention on follow-up.  No noncontrast radiographic evidence of distant metastasis disease in the chest, abdomen or pelvis.  07/03/2021.  Follow-up visit with Dr. Berline Lopes Given that patient's mobility has improved and recovered well from her surgery.  Dr. Berline Lopes recommends adjuvant chemotherapy most likely carboplatin alone or dose reduced carboplatin and paclitaxel.  Since patient lives closer to [facility], patient was referred to establish care with [facility] for further management.  Patient has baseline dementia, she goes to senior care facility via [facility].  Patient was accompanied by her daughter  [name] who reports to be patient's power of attorney. Patient is a poor historian.  She denies any pain.  Per daughter, patient has recovered very well since her surgery.  She walks with a walker and participates in physical exercise with  assistance at the facility.  No reports of pain or vaginal discharge, fever or chills.  Patient appears tearful, and per daughter, they have discussed with patient about her diagnosis of " tumor", and prefers no further mentioning of " cancer" during our discussion today.  INTERVAL HISTORY Alexis Mclaughlin is a 75 y.o. female who has above history reviewed by me today presents for follow up visit for evaluation prior to adjuvant chemotherapy. Patient was here by herself. She denies nausea vomiting, diarrhea. She says " I want to get out of here". She voices no other complaints.   Review of Systems  Unable to perform ROS: Dementia  Constitutional:  Negative for fever.  Genitourinary:  Negative for vaginal bleeding and vaginal discharge.    MEDICAL HISTORY:  Past Medical History:  Diagnosis Date   Arthritis    Constipation    Dementia ([place])    Diabetes mellitus without complication ([place])    GERD (gastroesophageal reflux disease)    Hypertension    Hypothyroidism     SURGICAL HISTORY: Past Surgical History:  Procedure Laterality Date   BREAST BIOPSY Right 09/02/2018   affirm stereo/path pending   CYSTOSCOPY  06/05/2021   Procedure: CYSTOSCOPY;  Surgeon: [name], MD;  Location: [facility];  Service: Gynecology;;   DILATION AND CURETTAGE OF UTERUS N/A 06/05/2021   Procedure: DILATATION AND CURETTAGE UNDER LAPAROSCOPIC GUIDANCE;  Surgeon: [name], MD;  Location: [facility];  Service: Gynecology;  Laterality: N/A;   LAPAROSCOPY N/A 06/05/2021   Procedure: LAPAROSCOPY OPERATIVE;  Surgeon: [name], MD;  Location: [facility];  Service: Gynecology;  Laterality: N/A;   ROBOTIC ASSISTED TOTAL HYSTERECTOMY WITH BILATERAL SALPINGO  OOPHERECTOMY N/A 06/05/2021   Procedure: XI ROBOTIC ASSISTED TOTAL HYSTERECTOMY WITH BILATERAL SALPINGO OOPHORECTOMY;  Surgeon: [name], MD;  Location: [facility];  Service: Gynecology;  Laterality: N/A;   TUBAL LIGATION      SOCIAL HISTORY: Social History   Socioeconomic History   Marital status: Married    Spouse name: Not on file   Number of children: Not on file   Years of education: Not on file   Highest education level: Not on file  Occupational History   Not on file  Tobacco Use   Smoking status: Former    Types: Cigarettes   Smokeless tobacco: Never   Tobacco comments:    Quit at least 30 years ago (prior to 1990)  Vaping Use   Vaping Use: Never used  Substance and Sexual Activity   Alcohol use: Not Currently   Drug use: Never   Sexual activity: Not Currently  Other Topics Concern   Not on file  Social History Narrative   Not on file   Social Determinants of Health   Financial Resource Strain: Not on file  Food Insecurity: Not on file  Transportation Needs: Not on file  Physical Activity: Not on file  Stress: Not on file  Social Connections: Not on file  Intimate Partner Violence: Not on file    FAMILY HISTORY: Family History  Problem Relation Age of Onset   CAD Mother    CAD Father    Cancer Neg Hx     ALLERGIES:  is allergic to ace inhibitors.  MEDICATIONS:  Current Outpatient Medications  Medication Sig Dispense Refill   amLODipine (NORVASC) 10 MG tablet Take 10 mg by mouth daily.     ANTI-ITCH lotion Apply topically.     aspirin EC 81 MG tablet Take 81 mg by mouth  daily. Swallow whole.     cholecalciferol (VITAMIN D3) 25 MCG (1000 UNIT) tablet Take 1,000 Units by mouth daily.     diclofenac Sodium (VOLTAREN) 1 % GEL SMARTSIG:Gram(s) Topical Twice Daily     docusate sodium (COLACE) 100 MG capsule Take 100 mg by mouth daily.     DULoxetine (CYMBALTA) 30 MG capsule Take 30 mg by mouth daily.     DULoxetine (CYMBALTA) 60 MG capsule Take  60 mg by mouth daily.     ferrous sulfate 325 (65 FE) MG EC tablet Take 1 tablet (325 mg total) by mouth daily. 30 tablet 1   fluticasone (FLONASE) 50 MCG/ACT nasal spray Place 2 sprays into both nostrils daily.     furosemide (LASIX) 20 MG tablet Take 20 mg by mouth.     gabapentin (NEURONTIN) 100 MG capsule Take 1 capsule (100 mg total) by mouth at bedtime for 30 days, THEN 2 capsules (200 mg total) at bedtime for 30 days, THEN 3 capsules (300 mg total) at bedtime. Follow written titration schedule.. 180 capsule 0   Insulin Glargine (BASAGLAR KWIKPEN) 100 UNIT/ML Inject 14 Units into the skin daily.     levothyroxine (SYNTHROID) 50 MCG tablet Take 50 mcg by mouth daily before breakfast.     lidocaine (LIDODERM) 5 % Place 2 patches onto the skin daily as needed (pain). Remove & Discard patch within 12 hours or as directed by MD     LIPITOR 80 MG tablet Take 1 tablet by mouth daily.     LORazepam (ATIVAN) 0.5 MG tablet SMARTSIG:0.5 Milligram(s) By Mouth Every Morning     metFORMIN (GLUCOPHAGE-XR) 500 MG 24 hr tablet Take 500 mg by mouth 2 (two) times daily.     metoprolol succinate (TOPROL-XL) 25 MG 24 hr tablet Take 1 tablet (25 mg total) by mouth daily. 30 tablet 1   miconazole (MICOTIN) 2 % cream Apply 1 application topically at bedtime.     nystatin (MYCOSTATIN/NYSTOP) powder Apply 1 application topically 2 (two) times daily.     omeprazole (PRILOSEC) 20 MG capsule Take 20 mg by mouth daily.     ondansetron (ZOFRAN) 8 MG tablet Take 1 tablet (8 mg total) by mouth 2 (two) times daily as needed. Start on the third day after chemotherapy. 30 tablet 1   polyethylene glycol (MIRALAX / GLYCOLAX) 17 g packet Take 17 g by mouth 2 (two) times daily. 14 each 0   prochlorperazine (COMPAZINE) 10 MG tablet Take 1 tablet (10 mg total) by mouth every 6 (six) hours as needed (Nausea or vomiting). 30 tablet 1   sodium chloride (OCEAN) 0.65 % nasal spray Place 1 spray into the nose 2 (two) times daily as needed  for congestion.     traMADol (ULTRAM) 50 MG tablet Take by mouth.     traZODone (DESYREL) 50 MG tablet Take 25 mg by mouth at bedtime.     trolamine salicylate (ASPERCREME/ALOE) 10 % cream Apply 1 application topically every 6 (six) hours as needed for muscle pain.     Witch Hazel (EQ HYGIENIC CLEANSING WIPES) PADS Apply 1 application topically 2 (two) times daily.     No current facility-administered medications for this visit.   Facility-Administered Medications Ordered in Other Visits  Medication Dose Route Frequency Provider Last Rate Last Admin   CARBOplatin (PARAPLATIN) 490 mg in sodium chloride 0.9 % 250 mL chemo infusion  490 mg Intravenous Once Earlie Server, MD       PACLitaxel (TAXOL) 288 mg in  sodium chloride 0.9 % 250 mL chemo infusion (> 80mg /m2)  135 mg/m2 (Order-Specific) Intravenous Once Earlie Server, MD 99 mL/hr at 09/18/21 1158 288 mg at 09/18/21 1158     PHYSICAL EXAMINATION: ECOG PERFORMANCE STATUS: 2 - Symptomatic, <50% confined to bed Vitals:   09/18/21 0844  BP: 116/74  Pulse: (!) 108  Resp: 20  Temp: 97.8 F (36.6 C)  SpO2: 100%   There were no vitals filed for this visit.    Physical Exam Constitutional:      General: She is not in acute distress.    Appearance: She is obese.     Comments: Patient sits in a wheelchair today.  HENT:     Head: Normocephalic and atraumatic.  Eyes:     General: No scleral icterus. Cardiovascular:     Rate and Rhythm: Normal rate and regular rhythm.     Heart sounds: Normal heart sounds.  Pulmonary:     Effort: Pulmonary effort is normal. No respiratory distress.     Breath sounds: No wheezing.  Abdominal:     General: Bowel sounds are normal. There is no distension.     Palpations: Abdomen is soft.  Musculoskeletal:        General: No deformity. Normal range of motion.     Cervical back: Normal range of motion and neck supple.  Skin:    General: Skin is warm and dry.     Findings: No erythema or rash.  Neurological:      Mental Status: She is alert. Mental status is at baseline.     Cranial Nerves: No cranial nerve deficit.     Coordination: Coordination normal.     Comments: Mildly agitated, close to her baseline.  Orientated x 2 name and place.     LABORATORY DATA:  I have reviewed the data as listed Lab Results  Component Value Date   WBC 8.8 09/18/2021   HGB 13.0 09/18/2021   HCT 43.5 09/18/2021   MCV 78.4 (L) 09/18/2021   PLT 348 09/18/2021   Recent Labs    08/14/21 1030 08/28/21 0845 09/18/21 0814  NA 134* 135 135  K 3.4* 3.3* 4.2  CL 98 97* 100  CO2 25 25 22   GLUCOSE 135* 146* 144*  BUN 9 8 14   CREATININE 0.73 0.73 1.06*  CALCIUM 8.4* 8.2* 8.7*  GFRNONAA >60 >60 54*  PROT 7.7 7.7 7.2  ALBUMIN 3.7 3.7 3.7  AST 23 28 30   ALT 20 17 19   ALKPHOS 163* 179* 169*  BILITOT 0.5 0.4 0.4    Iron/TIBC/Ferritin/ %Sat    Component Value Date/Time   IRON 28 07/10/2021 1147   IRON 54 07/05/2014 0958   TIBC 249 (L) 07/10/2021 1147   TIBC 334 07/05/2014 0958   FERRITIN 180 07/10/2021 1147   FERRITIN 159 07/05/2014 0958   IRONPCTSAT 11 07/10/2021 1147   IRONPCTSAT 16 07/05/2014 0958       RADIOGRAPHIC STUDIES: I have personally reviewed the radiological images as listed and agreed with the findings in the report. No results found.    ASSESSMENT & PLAN:  1. Encounter for antineoplastic chemotherapy   2. Malignant neoplasm of uterus, unspecified site ([place])   3. Dementia without behavioral disturbance, unspecified dementia type ([facility])   4. Other iron deficiency anemia   5. Elevated serum creatinine    #Poorly differentiated neoplasm of uterus Labs are reviewed and discussed with patient. She tolerated 2 cycle of carboplatin with Taxol 135 mg/m2 Labs are reviewed and  discussed with patient. Proceed with cycle 3 carboplatin AUC 5, Taxol 135mg /m2  Plan to obtain CT chest abdomen pelvis for evaluation of treatment response.  I discussed with her Gynonc Dr.Tucker who  recommends total of 6 cycles of chemo if she tolerates. After her visit today, I called patient's primary care provider Dionicia Abler and updated her about the plan. Patient declined being weighed today. PCP mentioned that  she has decreased appetite and also lost weight.   # Elevated Cr, IVF 562ml NS today Iron deficiency anemia, hemoglobin  is stable.  Continue ferrous sulfate 325 mg once daily.  #Dementia, hopefully patient will continue to cope with chemotherapy.  Managed by primary care provider, Ativan 0.5 mg PRN prior to the chemo if needed for agitation.   # Alkaline phosphate elevation chronic.   All questions were answered. The patient knows to call the clinic with any problems questions or concerns.  cc Dionicia Abler, NP  Return of visit:  3 weeks  for evaluation prior to next chemo  Earlie Server, MD, PhD 09/18/2021

## 2021-09-18 NOTE — Patient Instructions (Signed)
[facility]  Discharge Instructions: Thank you for choosing [facility] to provide your oncology and hematology care.  If you have a lab appointment with the Cancer Center, please go directly to the Cancer Center and check in at the registration area.  Wear comfortable clothing and clothing appropriate for easy access to any Portacath or PICC line.   We strive to give you quality time with your provider. You may need to reschedule your appointment if you arrive late (15 or more minutes).  Arriving late affects you and other patients whose appointments are after yours.  Also, if you miss three or more appointments without notifying the office, you may be dismissed from the clinic at the provider's discretion.      For prescription refill requests, have your pharmacy contact our office and allow 72 hours for refills to be completed.    Today you received the following chemotherapy and/or immunotherapy agents: Taxol, Carboplatin      To help prevent nausea and vomiting after your treatment, we encourage you to take your nausea medication as directed.  BELOW ARE SYMPTOMS THAT SHOULD BE REPORTED IMMEDIATELY: *FEVER GREATER THAN 100.4 F (38 C) OR HIGHER *CHILLS OR SWEATING *NAUSEA AND VOMITING THAT IS NOT CONTROLLED WITH YOUR NAUSEA MEDICATION *UNUSUAL SHORTNESS OF BREATH *UNUSUAL BRUISING OR BLEEDING *URINARY PROBLEMS (pain or burning when urinating, or frequent urination) *BOWEL PROBLEMS (unusual diarrhea, constipation, pain near the anus) TENDERNESS IN MOUTH AND THROAT WITH OR WITHOUT PRESENCE OF ULCERS (sore throat, sores in mouth, or a toothache) UNUSUAL RASH, SWELLING OR PAIN  UNUSUAL VAGINAL DISCHARGE OR ITCHING   Items with * indicate a potential emergency and should be followed up as soon as possible or go to the Emergency Department if any problems should occur.  Please show the CHEMOTHERAPY ALERT CARD or IMMUNOTHERAPY ALERT CARD at  check-in to the Emergency Department and triage nurse.  Should you have questions after your visit or need to cancel or reschedule your appointment, please contact [facility]  [phone] and follow the prompts.  Office hours are 8:00 a.m. to 4:30 p.m. Monday - Friday. Please note that voicemails left after 4:00 p.m. may not be returned until the following business day.  We are closed weekends and major holidays. You have access to a nurse at all times for urgent questions. Please call the main number to the clinic [phone] and follow the prompts.  For any non-urgent questions, you may also contact your provider using MyChart. We now offer e-Visits for anyone 18 and older to request care online for non-urgent symptoms. For details visit [email or web address].   Also download the MyChart app! Go to the app store, search "MyChart", open the app, select , and log in with your MyChart username and password.  Due to Covid, a mask is required upon entering the hospital/clinic. If you do not have a mask, one will be given to you upon arrival. For doctor visits, patients may have 1 support person aged 18 or older with them. For treatment visits, patients cannot have anyone with them due to current Covid guidelines and our immunocompromised population. Carboplatin injection What is this medication? CARBOPLATIN (KAR boe pla tin) is a chemotherapy drug. It targets fast dividing cells, like cancer cells, and causes these cells to die. This medicine is used to treat ovarian cancer and many other cancers. This medicine may be used for other purposes; ask your health care provider or pharmacist   if you have questions. COMMON BRAND NAME(S): Paraplatin What should I tell my care team before I take this medication? They need to know if you have any of these conditions: blood disorders hearing problems kidney disease recent or ongoing radiation therapy an unusual  or allergic reaction to carboplatin, cisplatin, other chemotherapy, other medicines, foods, dyes, or preservatives pregnant or trying to get pregnant breast-feeding How should I use this medication? This drug is usually given as an infusion into a vein. It is administered in a hospital or clinic by a specially trained health care professional. Talk to your pediatrician regarding the use of this medicine in children. Special care may be needed. Overdosage: If you think you have taken too much of this medicine contact a poison control center or emergency room at once. NOTE: This medicine is only for you. Do not share this medicine with others. What if I miss a dose? It is important not to miss a dose. Call your doctor or health care professional if you are unable to keep an appointment. What may interact with this medication? medicines for seizures medicines to increase blood counts like filgrastim, pegfilgrastim, sargramostim some antibiotics like amikacin, gentamicin, neomycin, streptomycin, tobramycin vaccines Talk to your doctor or health care professional before taking any of these medicines: acetaminophen aspirin ibuprofen ketoprofen naproxen This list may not describe all possible interactions. Give your health care provider a list of all the medicines, herbs, non-prescription drugs, or dietary supplements you use. Also tell them if you smoke, drink alcohol, or use illegal drugs. Some items may interact with your medicine. What should I watch for while using this medication? Your condition will be monitored carefully while you are receiving this medicine. You will need important blood work done while you are taking this medicine. This drug may make you feel generally unwell. This is not uncommon, as chemotherapy can affect healthy cells as well as cancer cells. Report any side effects. Continue your course of treatment even though you feel ill unless your doctor tells you to stop. In  some cases, you may be given additional medicines to help with side effects. Follow all directions for their use. Call your doctor or health care professional for advice if you get a fever, chills or sore throat, or other symptoms of a cold or flu. Do not treat yourself. This drug decreases your body's ability to fight infections. Try to avoid being around people who are sick. This medicine may increase your risk to bruise or bleed. Call your doctor or health care professional if you notice any unusual bleeding. Be careful brushing and flossing your teeth or using a toothpick because you may get an infection or bleed more easily. If you have any dental work done, tell your dentist you are receiving this medicine. Avoid taking products that contain aspirin, acetaminophen, ibuprofen, naproxen, or ketoprofen unless instructed by your doctor. These medicines may hide a fever. Do not become pregnant while taking this medicine. Women should inform their doctor if they wish to become pregnant or think they might be pregnant. There is a potential for serious side effects to an unborn child. Talk to your health care professional or pharmacist for more information. Do not breast-feed an infant while taking this medicine. What side effects may I notice from receiving this medication? Side effects that you should report to your doctor or health care professional as soon as possible: allergic reactions like skin rash, itching or hives, swelling of the face, lips, or tongue signs   of infection - fever or chills, cough, sore throat, pain or difficulty passing urine signs of decreased platelets or bleeding - bruising, pinpoint red spots on the skin, black, tarry stools, nosebleeds signs of decreased red blood cells - unusually weak or tired, fainting spells, lightheadedness breathing problems changes in hearing changes in vision chest pain high blood pressure low blood counts - This drug may decrease the number of  white blood cells, red blood cells and platelets. You may be at increased risk for infections and bleeding. nausea and vomiting pain, swelling, redness or irritation at the injection site pain, tingling, numbness in the hands or feet problems with balance, talking, walking trouble passing urine or change in the amount of urine Side effects that usually do not require medical attention (report to your doctor or health care professional if they continue or are bothersome): hair loss loss of appetite metallic taste in the mouth or changes in taste This list may not describe all possible side effects. Call your doctor for medical advice about side effects. You may report side effects to FDA at 1-800-FDA-1088. Where should I keep my medication? This drug is given in a hospital or clinic and will not be stored at home. NOTE: This sheet is a summary. It may not cover all possible information. If you have questions about this medicine, talk to your doctor, pharmacist, or health care provider.  2022 Elsevier/Gold Standard (2008-03-15 14:38:05) Paclitaxel injection What is this medication? PACLITAXEL (PAK li TAX el) is a chemotherapy drug. It targets fast dividing cells, like cancer cells, and causes these cells to die. This medicine is used to treat ovarian cancer, breast cancer, lung cancer, Kaposi's sarcoma, and other cancers. This medicine may be used for other purposes; ask your health care provider or pharmacist if you have questions. COMMON BRAND NAME(S): Onxol, Taxol What should I tell my care team before I take this medication? They need to know if you have any of these conditions: history of irregular heartbeat liver disease low blood counts, like low white cell, platelet, or red cell counts lung or breathing disease, like asthma tingling of the fingers or toes, or other nerve disorder an unusual or allergic reaction to paclitaxel, alcohol, polyoxyethylated castor oil, other chemotherapy,  other medicines, foods, dyes, or preservatives pregnant or trying to get pregnant breast-feeding How should I use this medication? This drug is given as an infusion into a vein. It is administered in a hospital or clinic by a specially trained health care professional. Talk to your pediatrician regarding the use of this medicine in children. Special care may be needed. Overdosage: If you think you have taken too much of this medicine contact a poison control center or emergency room at once. NOTE: This medicine is only for you. Do not share this medicine with others. What if I miss a dose? It is important not to miss your dose. Call your doctor or health care professional if you are unable to keep an appointment. What may interact with this medication? Do not take this medicine with any of the following medications: live virus vaccines This medicine may also interact with the following medications: antiviral medicines for hepatitis, HIV or AIDS certain antibiotics like erythromycin and clarithromycin certain medicines for fungal infections like ketoconazole and itraconazole certain medicines for seizures like carbamazepine, phenobarbital, phenytoin gemfibrozil nefazodone rifampin St. John's wort This list may not describe all possible interactions. Give your health care provider a list of all the medicines, herbs, non-prescription drugs, or   dietary supplements you use. Also tell them if you smoke, drink alcohol, or use illegal drugs. Some items may interact with your medicine. What should I watch for while using this medication? Your condition will be monitored carefully while you are receiving this medicine. You will need important blood work done while you are taking this medicine. This medicine can cause serious allergic reactions. To reduce your risk you will need to take other medicine(s) before treatment with this medicine. If you experience allergic reactions like skin rash, itching  or hives, swelling of the face, lips, or tongue, tell your doctor or health care professional right away. In some cases, you may be given additional medicines to help with side effects. Follow all directions for their use. This drug may make you feel generally unwell. This is not uncommon, as chemotherapy can affect healthy cells as well as cancer cells. Report any side effects. Continue your course of treatment even though you feel ill unless your doctor tells you to stop. Call your doctor or health care professional for advice if you get a fever, chills or sore throat, or other symptoms of a cold or flu. Do not treat yourself. This drug decreases your body's ability to fight infections. Try to avoid being around people who are sick. This medicine may increase your risk to bruise or bleed. Call your doctor or health care professional if you notice any unusual bleeding. Be careful brushing and flossing your teeth or using a toothpick because you may get an infection or bleed more easily. If you have any dental work done, tell your dentist you are receiving this medicine. Avoid taking products that contain aspirin, acetaminophen, ibuprofen, naproxen, or ketoprofen unless instructed by your doctor. These medicines may hide a fever. Do not become pregnant while taking this medicine. Women should inform their doctor if they wish to become pregnant or think they might be pregnant. There is a potential for serious side effects to an unborn child. Talk to your health care professional or pharmacist for more information. Do not breast-feed an infant while taking this medicine. Men are advised not to father a child while receiving this medicine. This product may contain alcohol. Ask your pharmacist or healthcare provider if this medicine contains alcohol. Be sure to tell all healthcare providers you are taking this medicine. Certain medicines, like metronidazole and disulfiram, can cause an unpleasant reaction when  taken with alcohol. The reaction includes flushing, headache, nausea, vomiting, sweating, and increased thirst. The reaction can last from 30 minutes to several hours. What side effects may I notice from receiving this medication? Side effects that you should report to your doctor or health care professional as soon as possible: allergic reactions like skin rash, itching or hives, swelling of the face, lips, or tongue breathing problems changes in vision fast, irregular heartbeat high or low blood pressure mouth sores pain, tingling, numbness in the hands or feet signs of decreased platelets or bleeding - bruising, pinpoint red spots on the skin, black, tarry stools, blood in the urine signs of decreased red blood cells - unusually weak or tired, feeling faint or lightheaded, falls signs of infection - fever or chills, cough, sore throat, pain or difficulty passing urine signs and symptoms of liver injury like dark yellow or brown urine; general ill feeling or flu-like symptoms; light-colored stools; loss of appetite; nausea; right upper belly pain; unusually weak or tired; yellowing of the eyes or skin swelling of the ankles, feet, hands unusually slow heartbeat Side   effects that usually do not require medical attention (report to your doctor or health care professional if they continue or are bothersome): diarrhea hair loss loss of appetite muscle or joint pain nausea, vomiting pain, redness, or irritation at site where injected tiredness This list may not describe all possible side effects. Call your doctor for medical advice about side effects. You may report side effects to FDA at 1-800-FDA-1088. Where should I keep my medication? This drug is given in a hospital or clinic and will not be stored at home. NOTE: This sheet is a summary. It may not cover all possible information. If you have questions about this medicine, talk to your doctor, pharmacist, or health care provider.  2022  Elsevier/Gold Standard (2019-11-10 13:37:23)  

## 2021-10-05 ENCOUNTER — Ambulatory Visit
Admission: RE | Admit: 2021-10-05 | Discharge: 2021-10-05 | Disposition: A | Discharge status: Home / Self Care | Payer: Medicare (Managed Care) | Source: Ambulatory Visit | Attending: Oncology | Admitting: Oncology

## 2021-10-05 ENCOUNTER — Other Ambulatory Visit: Payer: Self-pay

## 2021-10-05 DIAGNOSIS — C55 Malignant neoplasm of uterus, part unspecified: Secondary | ICD-10-CM | POA: Diagnosis present

## 2021-10-05 MED ORDER — IOHEXOL 350 MG/ML SOLN
100.0000 mL | Freq: Once | INTRAVENOUS | Status: AC | PRN
  Administered 2021-10-05: 100 mL via INTRAVENOUS

## 2021-10-08 ENCOUNTER — Other Ambulatory Visit: Payer: Self-pay

## 2021-10-09 ENCOUNTER — Inpatient Hospital Stay: Payer: Medicare (Managed Care) | Attending: Oncology

## 2021-10-09 ENCOUNTER — Encounter: Payer: Self-pay | Admitting: Oncology

## 2021-10-09 ENCOUNTER — Inpatient Hospital Stay (HOSPITAL_BASED_OUTPATIENT_CLINIC_OR_DEPARTMENT_OTHER): Payer: Medicare (Managed Care) | Admitting: Oncology

## 2021-10-09 ENCOUNTER — Other Ambulatory Visit: Payer: Self-pay

## 2021-10-09 ENCOUNTER — Inpatient Hospital Stay: Payer: Medicare (Managed Care)

## 2021-10-09 VITALS — BP 123/86 | HR 118 | Temp 96.9°F | Resp 18 | Wt 193.4 lb

## 2021-10-09 VITALS — HR 97

## 2021-10-09 DIAGNOSIS — F03911 Unspecified dementia, unspecified severity, with agitation: Secondary | ICD-10-CM

## 2021-10-09 DIAGNOSIS — D508 Other iron deficiency anemias: Secondary | ICD-10-CM | POA: Diagnosis not present

## 2021-10-09 DIAGNOSIS — R7989 Other specified abnormal findings of blood chemistry: Secondary | ICD-10-CM | POA: Insufficient documentation

## 2021-10-09 DIAGNOSIS — C55 Malignant neoplasm of uterus, part unspecified: Secondary | ICD-10-CM | POA: Diagnosis not present

## 2021-10-09 DIAGNOSIS — R748 Abnormal levels of other serum enzymes: Secondary | ICD-10-CM | POA: Diagnosis not present

## 2021-10-09 DIAGNOSIS — Z5111 Encounter for antineoplastic chemotherapy: Secondary | ICD-10-CM

## 2021-10-09 DIAGNOSIS — Z87891 Personal history of nicotine dependence: Secondary | ICD-10-CM | POA: Diagnosis not present

## 2021-10-09 LAB — CBC WITH DIFFERENTIAL/PLATELET
Abs Immature Granulocytes: 0.04 10*3/uL (ref 0.00–0.07)
Basophils Absolute: 0 10*3/uL (ref 0.0–0.1)
Basophils Relative: 1 %
Eosinophils Absolute: 0.1 10*3/uL (ref 0.0–0.5)
Eosinophils Relative: 1 %
HCT: 39.8 % (ref 36.0–46.0)
Hemoglobin: 12.3 g/dL (ref 12.0–15.0)
Immature Granulocytes: 1 %
Lymphocytes Relative: 21 %
Lymphs Abs: 1.7 10*3/uL (ref 0.7–4.0)
MCH: 23.5 pg — ABNORMAL LOW (ref 26.0–34.0)
MCHC: 30.9 g/dL (ref 30.0–36.0)
MCV: 76.1 fL — ABNORMAL LOW (ref 80.0–100.0)
Monocytes Absolute: 1 10*3/uL (ref 0.1–1.0)
Monocytes Relative: 12 %
Neutro Abs: 5.5 10*3/uL (ref 1.7–7.7)
Neutrophils Relative %: 64 %
Platelets: 330 10*3/uL (ref 150–400)
RBC: 5.23 MIL/uL — ABNORMAL HIGH (ref 3.87–5.11)
RDW: 20.3 % — ABNORMAL HIGH (ref 11.5–15.5)
WBC: 8.3 10*3/uL (ref 4.0–10.5)
nRBC: 0 % (ref 0.0–0.2)

## 2021-10-09 LAB — COMPREHENSIVE METABOLIC PANEL
ALT: 18 U/L (ref 0–44)
AST: 34 U/L (ref 15–41)
Albumin: 3.7 g/dL (ref 3.5–5.0)
Alkaline Phosphatase: 174 U/L — ABNORMAL HIGH (ref 38–126)
Anion gap: 10 (ref 5–15)
BUN: 21 mg/dL (ref 8–23)
CO2: 22 mmol/L (ref 22–32)
Calcium: 8.8 mg/dL — ABNORMAL LOW (ref 8.9–10.3)
Chloride: 101 mmol/L (ref 98–111)
Creatinine, Ser: 1.21 mg/dL — ABNORMAL HIGH (ref 0.44–1.00)
GFR, Estimated: 46 mL/min — ABNORMAL LOW (ref >60)
Glucose, Bld: 170 mg/dL — ABNORMAL HIGH (ref 70–99)
Potassium: 4.6 mmol/L (ref 3.5–5.1)
Sodium: 133 mmol/L — ABNORMAL LOW (ref 135–145)
Total Bilirubin: 0.6 mg/dL (ref 0.3–1.2)
Total Protein: 7.5 g/dL (ref 6.5–8.1)

## 2021-10-09 MED ORDER — SODIUM CHLORIDE 0.9 % IV SOLN
Freq: Once | INTRAVENOUS | Status: AC
  Administered 2021-10-09: 1000 mL via INTRAVENOUS
  Filled 2021-10-09: qty 250

## 2021-10-09 NOTE — Patient Instructions (Signed)
[facility]   Discharge Instructions: Thank you for choosing [facility] to provide your oncology and hematology care.  If you have a lab appointment with the [facility], please go directly to the [facility] and check in at the registration area.  We strive to give you quality time with your provider. You may need to reschedule your appointment if you arrive late (15 or more minutes).  Arriving late affects you and other patients whose appointments are after yours.  Also, if you miss three or more appointments without notifying the office, you may be dismissed from the clinic at the provider's discretion.      For prescription refill requests, have your pharmacy contact our office and allow 72 hours for refills to be completed.    Today you received the following: 0.9% Sodium Chloride infusion.      To help prevent nausea and vomiting after your treatment, we encourage you to take your nausea medication as directed.  BELOW ARE SYMPTOMS THAT SHOULD BE REPORTED IMMEDIATELY: *FEVER GREATER THAN 100.4 F (38 C) OR HIGHER *CHILLS OR SWEATING *NAUSEA AND VOMITING THAT IS NOT CONTROLLED WITH YOUR NAUSEA MEDICATION *UNUSUAL SHORTNESS OF BREATH *UNUSUAL BRUISING OR BLEEDING *URINARY PROBLEMS (pain or burning when urinating, or frequent urination) *BOWEL PROBLEMS (unusual diarrhea, constipation, pain near the anus) TENDERNESS IN MOUTH AND THROAT WITH OR WITHOUT PRESENCE OF ULCERS (sore throat, sores in mouth, or a toothache) UNUSUAL RASH, SWELLING OR PAIN  UNUSUAL VAGINAL DISCHARGE OR ITCHING   Items with * indicate a potential emergency and should be followed up as soon as possible or go to the Emergency Department if any problems should occur.  Please show the CHEMOTHERAPY ALERT CARD or IMMUNOTHERAPY ALERT CARD at check-in to the Emergency Department and triage nurse.  Should you have questions after your visit or need to cancel or  reschedule your appointment, please contact [facility]  [phone] and follow the prompts.  Office hours are 8:00 a.m. to 4:30 p.m. Monday - Friday. Please note that voicemails left after 4:00 p.m. may not be returned until the following business day.  We are closed weekends and major holidays. You have access to a nurse at all times for urgent questions. Please call the main number to the clinic [phone] and follow the prompts.  For any non-urgent questions, you may also contact your provider using MyChart. We now offer e-Visits for anyone 69 and older to request care online for non-urgent symptoms. For details visit [email or web address].   Also download the MyChart app! Go to the app store, search "MyChart", open the app, select [facility], and log in with your MyChart username and password.  Due to Covid, a mask is required upon entering the hospital/clinic. If you do not have a mask, one will be given to you upon arrival. For doctor visits, patients may have 1 support person aged 90 or older with them. For treatment visits, patients cannot have anyone with them due to current Covid guidelines and our immunocompromised population.

## 2021-10-09 NOTE — Progress Notes (Signed)
Hematology/Oncology progress note [facility] Telephone:([phone] [phone]   Patient Care Team: Dionicia Abler, NP as PCP - General Clent Jacks, RN as Oncology Nurse Navigator Earlie Server, MD as Consulting Physician (Hematology and Oncology)  REFERRING PROVIDER: Dionicia Abler, NP  CHIEF COMPLAINTS/REASON FOR VISIT:  Follow-up for treatment of uterus neoplasm.  HISTORY OF PRESENTING ILLNESS:   Alexis Mclaughlin is a  75 y.o.  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  She was also noted to have lactic acidosis, leukocytosis, tachycardia. 06/01/2021, CT scan showed a markedly enlarged uterus with extensive fluid and air, consistent with intrauterine abscess. Blood cultures obtained on admission grew Clostridium perfringens.  She was treated with IV antibiotics, she passed a large amount of tissue vaginally on 06/02/2021-pathology showed necrotic poorly differentiated neoplasm.  She was transferred to [facility] for further management.  Patient was seen by gynecology oncology. 06/05/2021, status post robotic assisted laparoscopic total hysterectomy with bilateral salpingo-oophorectomy, excision of posterior cervix and upper vaginal, cystoscopy, D&C. Findings:  On EUA, enlarged 18cm bulbous uterus, minimally mobile. ON speculum exam, enlarged cervix almost entirely necrotic with necrotic tissue prolapsing through dilated cervical os. Necrotic tissue on D&C, no significant return of tissue and gritty texture felt almost immediately. Cervix approximately 6cm. On intra-abdominal entry, normal upper abdominal survey including liver edge, stomach, and omentum. Normal appearing small and large bowel. Uterus 18cm, bulbous at fundus. Atrophic appearing adnexa. No breach of uterine  capsule by tumor. Retroperitoneum significantly fibrotic and edematous, no obvious lymphadenopathy. After delivery of uterine specimen, significantly necrotic tissue noted along posterior colpotomy, likely with residual cervix in situ. After rectovaginal space created, additional posterior cervix excised down to level of healthy vagina. On cystoscopy, bladder dome intact, good efflux noted from bilateral ureteral orifices.   Pathology showed poorly differentiated malignant neoplasm with extensive necrosis and associated with superior of inflammation.  Tumor invades deep myometrium but does not involve serosal surface.  Cervix with separative inflammation and necrosis-no definitive evidence of tumor.  Right fallopian tube with suppurative inflammation and necrosis.  Benign unremarkable left fallopian tube and bilateral ovaries. Postop, patient gradually improves and was discharged to SNF on 06/08/2021..  06/20/2021, patient was seen by Dr. Berline Lopes for postop follow-up.  She appears physically recovering from her surgery very well. Patient's case was presented to GYN oncology tumor conference by Dr. Berline Lopes. 07/02/2021, repeat CT chest abdomen pelvis showed prominent low retroperitoneal, iliac, pelvic sidewall lymph nodes are unchanged.,  Nonspecific and modestly suspicious for nodal metastasis.  Attention on follow-up.  No noncontrast radiographic evidence of distant metastasis disease in the chest, abdomen or pelvis.  07/03/2021.  Follow-up visit with Dr. Berline Lopes Given that patient's mobility has improved and recovered well from her surgery.  Dr. Berline Lopes recommends adjuvant chemotherapy most likely carboplatin alone or dose reduced carboplatin and paclitaxel.  Since patient lives closer to [facility], patient was referred to establish care with [facility] for further management.  Patient has baseline dementia, she goes to senior care facility via [facility].  Patient was accompanied by her daughter  Alexis Mclaughlin who reports to be patient's power of attorney. Patient is a poor historian.  She denies any pain.  Per daughter, patient has recovered very well since her surgery.  She walks with a walker and participates in physical exercise with  assistance at the facility.  No reports of pain or vaginal discharge, fever or chills.  Patient appears tearful, and per daughter, they have discussed with patient about her diagnosis of " tumor", and prefers no further mentioning of " cancer" during our discussion today.  INTERVAL HISTORY Alexis Mclaughlin is a 75 y.o. female who has above history reviewed by me today presents for follow up visit for evaluation prior to adjuvant chemotherapy. Patient was agitated in the clinic.  She complains about back pain and wants to "get out of here".  She reports that the facility did not give her pain medication this morning and she has ba theck pain. Her heart rate was 118 in the clinic. Patient denies nausea vomiting and diarrhea.  Review of Systems  Unable to perform ROS: Dementia  Constitutional:  Negative for fever.  Genitourinary:  Negative for vaginal bleeding and vaginal discharge.    MEDICAL HISTORY:  Past Medical History:  Diagnosis Date   Arthritis    Constipation    Dementia ([place])    Diabetes mellitus without complication ([place])    GERD (gastroesophageal reflux disease)    Hypertension    Hypothyroidism     SURGICAL HISTORY: Past Surgical History:  Procedure Laterality Date   BREAST BIOPSY Right 09/02/2018   affirm stereo/path pending   CYSTOSCOPY  06/05/2021   Procedure: CYSTOSCOPY;  Surgeon: [name], MD;  Location: [facility];  Service: Gynecology;;   DILATION AND CURETTAGE OF UTERUS N/A 06/05/2021   Procedure: DILATATION AND CURETTAGE UNDER LAPAROSCOPIC GUIDANCE;  Surgeon: [name], MD;  Location: [facility];  Service: Gynecology;  Laterality: N/A;   LAPAROSCOPY N/A 06/05/2021   Procedure: LAPAROSCOPY OPERATIVE;  Surgeon: [name], MD;  Location: [facility];  Service: Gynecology;  Laterality: N/A;   ROBOTIC ASSISTED TOTAL HYSTERECTOMY WITH BILATERAL SALPINGO OOPHERECTOMY N/A 06/05/2021   Procedure: XI ROBOTIC ASSISTED TOTAL HYSTERECTOMY WITH BILATERAL SALPINGO OOPHORECTOMY;  Surgeon: [name], MD;  Location: [facility];  Service: Gynecology;  Laterality: N/A;   TUBAL LIGATION      SOCIAL HISTORY: Social History   Socioeconomic History   Marital status: Married    Spouse name: Not on file   Number of children: Not on file   Years of education: Not on file   Highest education level: Not on file  Occupational History   Not on file  Tobacco Use   Smoking status: Former    Types: Cigarettes   Smokeless tobacco: Never   Tobacco comments:    Quit at least 30 years ago (prior to 1990)  Vaping Use   Vaping Use: Never used  Substance and Sexual Activity   Alcohol use: Not Currently   Drug use: Never   Sexual activity: Not Currently  Other Topics Concern   Not on file  Social History Narrative   Not on file   Social Determinants of Health   Financial Resource Strain: Not on file  Food Insecurity: Not on file  Transportation Needs: Not on file  Physical Activity: Not on file  Stress: Not on file  Social Connections: Not on file  Intimate Partner Violence: Not on file    FAMILY HISTORY: Family History  Problem Relation Age of Onset   CAD Mother    CAD Father    Cancer Neg Hx     ALLERGIES:  is allergic to ace inhibitors.  MEDICATIONS:  Current Outpatient Medications  Medication Sig Dispense Refill   amLODipine (NORVASC) 10 MG tablet Take  10 mg by mouth daily.     ANTI-ITCH lotion Apply topically.     aspirin EC 81 MG tablet Take 81 mg by mouth daily. Swallow whole.     cholecalciferol (VITAMIN D3) 25 MCG (1000 UNIT) tablet Take 1,000 Units by mouth daily.     diclofenac Sodium (VOLTAREN) 1 % GEL SMARTSIG:Gram(s) Topical Twice Daily     docusate sodium (COLACE) 100 MG capsule Take  100 mg by mouth daily.     DULoxetine (CYMBALTA) 30 MG capsule Take 30 mg by mouth daily.     DULoxetine (CYMBALTA) 60 MG capsule Take 60 mg by mouth daily.     ferrous sulfate 325 (65 FE) MG EC tablet Take 1 tablet (325 mg total) by mouth daily. 30 tablet 1   fluticasone (FLONASE) 50 MCG/ACT nasal spray Place 2 sprays into both nostrils daily.     furosemide (LASIX) 20 MG tablet Take 20 mg by mouth.     gabapentin (NEURONTIN) 100 MG capsule Take 1 capsule (100 mg total) by mouth at bedtime for 30 days, THEN 2 capsules (200 mg total) at bedtime for 30 days, THEN 3 capsules (300 mg total) at bedtime. Follow written titration schedule.. 180 capsule 0   Insulin Glargine (BASAGLAR KWIKPEN) 100 UNIT/ML Inject 14 Units into the skin daily.     levothyroxine (SYNTHROID) 50 MCG tablet Take 50 mcg by mouth daily before breakfast.     lidocaine (LIDODERM) 5 % Place 2 patches onto the skin daily as needed (pain). Remove & Discard patch within 12 hours or as directed by MD     LIPITOR 80 MG tablet Take 1 tablet by mouth daily.     LORazepam (ATIVAN) 0.5 MG tablet SMARTSIG:0.5 Milligram(s) By Mouth Every Morning     metFORMIN (GLUCOPHAGE-XR) 500 MG 24 hr tablet Take 500 mg by mouth 2 (two) times daily.     metoprolol succinate (TOPROL-XL) 25 MG 24 hr tablet Take 1 tablet (25 mg total) by mouth daily. 30 tablet 1   miconazole (MICOTIN) 2 % cream Apply 1 application topically at bedtime.     nystatin (MYCOSTATIN/NYSTOP) powder Apply 1 application topically 2 (two) times daily.     omeprazole (PRILOSEC) 20 MG capsule Take 20 mg by mouth daily.     ondansetron (ZOFRAN) 8 MG tablet Take 1 tablet (8 mg total) by mouth 2 (two) times daily as needed. Start on the third day after chemotherapy. 30 tablet 1   polyethylene glycol (MIRALAX / GLYCOLAX) 17 g packet Take 17 g by mouth 2 (two) times daily. 14 each 0   prochlorperazine (COMPAZINE) 10 MG tablet Take 1 tablet (10 mg total) by mouth every 6 (six) hours as needed  (Nausea or vomiting). 30 tablet 1   sodium chloride (OCEAN) 0.65 % nasal spray Place 1 spray into the nose 2 (two) times daily as needed for congestion.     traMADol (ULTRAM) 50 MG tablet Take by mouth.     traZODone (DESYREL) 50 MG tablet Take 25 mg by mouth at bedtime.     trolamine salicylate (ASPERCREME/ALOE) 10 % cream Apply 1 application topically every 6 (six) hours as needed for muscle pain.     Witch Hazel (EQ HYGIENIC CLEANSING WIPES) PADS Apply 1 application topically 2 (two) times daily.     No current facility-administered medications for this visit.     PHYSICAL EXAMINATION: ECOG PERFORMANCE STATUS: 2 - Symptomatic, <50% confined to bed Vitals:   10/09/21 0835  BP: 123/86  Pulse: (!) 118  Resp: 18  Temp: (!) 96.9 F (36.1 C)  SpO2: 96%   Filed Weights   10/09/21 0835  Weight: 193 lb 6.4 oz (87.7 kg)      Physical Exam Constitutional:      General: She is not in acute distress.    Appearance: She is obese.     Comments: Patient sits in a wheelchair today.  HENT:     Head: Normocephalic and atraumatic.  Eyes:     General: No scleral icterus. Cardiovascular:     Rate and Rhythm: Normal rate and regular rhythm.     Heart sounds: Normal heart sounds.  Pulmonary:     Effort: Pulmonary effort is normal. No respiratory distress.     Breath sounds: No wheezing.  Abdominal:     General: Bowel sounds are normal. There is no distension.     Palpations: Abdomen is soft.  Musculoskeletal:        General: No deformity. Normal range of motion.     Cervical back: Normal range of motion and neck supple.  Skin:    General: Skin is warm and dry.     Findings: No erythema or rash.  Neurological:     Mental Status: She is alert. Mental status is at baseline.     Cranial Nerves: No cranial nerve deficit.     Coordination: Coordination normal.     Comments: Agitated. Orientated x 2 name and place.     LABORATORY DATA:  I have reviewed the data as listed Lab  Results  Component Value Date   WBC 8.3 10/09/2021   HGB 12.3 10/09/2021   HCT 39.8 10/09/2021   MCV 76.1 (L) 10/09/2021   PLT 330 10/09/2021   Recent Labs    08/28/21 0845 09/18/21 0814 10/09/21 0756  NA 135 135 133*  K 3.3* 4.2 4.6  CL 97* 100 101  CO2 25 22 22   GLUCOSE 146* 144* 170*  BUN 8 14 21   CREATININE 0.73 1.06* 1.21*  CALCIUM 8.2* 8.7* 8.8*  GFRNONAA >60 54* 46*  PROT 7.7 7.2 7.5  ALBUMIN 3.7 3.7 3.7  AST 28 30 34  ALT 17 19 18   ALKPHOS 179* 169* 174*  BILITOT 0.4 0.4 0.6    Iron/TIBC/Ferritin/ %Sat    Component Value Date/Time   IRON 28 07/10/2021 1147   IRON 54 07/05/2014 0958   TIBC 249 (L) 07/10/2021 1147   TIBC 334 07/05/2014 0958   FERRITIN 180 07/10/2021 1147   FERRITIN 159 07/05/2014 0958   IRONPCTSAT 11 07/10/2021 1147   IRONPCTSAT 16 07/05/2014 0958       RADIOGRAPHIC STUDIES: I have personally reviewed the radiological images as listed and agreed with the findings in the report. CT CHEST ABDOMEN PELVIS W CONTRAST  Result Date: 10/08/2021 CLINICAL DATA:  Uterine carcinoma staging. Patient status post hysterectomy EXAM: CT CHEST, ABDOMEN, AND PELVIS WITH CONTRAST TECHNIQUE: Multidetector CT imaging of the chest, abdomen and pelvis was performed following the standard protocol during bolus administration of intravenous contrast. CONTRAST:  152mL OMNIPAQUE IOHEXOL 350 MG/ML SOLN COMPARISON:  None. FINDINGS: CT CHEST FINDINGS Cardiovascular: No significant vascular findings. Normal heart size. No pericardial effusion. Mediastinum/Nodes: No axillary or supraclavicular adenopathy. No mediastinal or hilar adenopathy. No pericardial fluid. Esophagus normal. Lungs/Pleura: No suspicious pulmonary nodules. Normal pleural. Airways normal. Musculoskeletal: No aggressive osseous lesion. CT ABDOMEN AND PELVIS FINDINGS Hepatobiliary: No focal hepatic lesion. Several gallstones present. No gallbladder inflammation. Pancreas: Pancreas is normal. No ductal  dilatation. No pancreatic inflammation. Spleen: Normal spleen Adrenals/urinary tract: Adrenal glands and kidneys are normal. Simple fluid attenuation cyst exophytic from the LEFT kidney. The ureters and bladder normal. Stomach/Bowel: Stomach, small bowel, appendix, and cecum are normal. The colon and rectosigmoid colon are normal. Vascular/Lymphatic: Abdominal aorta is normal caliber. There is no retroperitoneal or periportal lymphadenopathy. No pelvic lymphadenopathy. Reproductive: Post hysterectomy. No pelvic sidewall nodularity. No pelvic lymphadenopathy. Other: No peritoneal disease Musculoskeletal: No aggressive osseous lesion. IMPRESSION: 1. No evidence of metastatic uterine cancer. 2. Post hysterectomy. No evidence of pelvic sidewall nodularity or pelvic lymphadenopathy. 3. Incidental finding of cholelithiasis and benign LEFT renal cyst. Electronically Signed   By: Suzy Bouchard M.D.   On: 10/08/2021 12:29      ASSESSMENT & PLAN:  1. Encounter for antineoplastic chemotherapy   2. Malignant neoplasm of uterus, unspecified site ([place])   3. Other iron deficiency anemia   4. Elevated serum creatinine   5. Agitation due to dementia    #Poorly differentiated neoplasm of uterus S/p 3 cycles of adjuvant carboplatin Taxol.  10/05/2021 CT chest abdomen pelvis shows no recurrence or metastatic disease.  No evidence of pelvic sidewall nodularity or lymphadenopathy.  Incidental findings of cholelithiasis and benign left renal cyst. I discussed CT findings with patient's gynecology oncologist Dr. Berline Lopes and she recommends to continue another 3 cycles of adjuvant chemotherapy if the patient is able to tolerate. Will hold treatment today due to decrease of kidney function and agitation.  #Elevated creatinine, patient will be given 1 L of IV fluid normal saline.  I also spoke with her primary care provider at [place].  Encourage oral hydration.  # Iron deficiency anemia, hemoglobin  is stable.  Continue  ferrous sulfate 325 mg once daily.  #Dementia, hopefully patient will continue to cope with chemotherapy.  Managed by primary care provider, Ativan 0.5 mg PRN prior to the chemo if needed for agitation.  Today she appears to be agitated.  Probably aggravated due to not receiving her pain medication prior to coming to the visit.  Spoke with primary care provider Dekar  # Alkaline phosphate elevation chronic.   All questions were answered. The patient knows to call the clinic with any problems questions or concerns.  cc Dionicia Abler, NP  Return of visit:  1 week  for evaluation prior to next chemo  Earlie Server, MD, PhD 10/09/2021

## 2021-10-09 NOTE — Progress Notes (Signed)
Patient here for follow up. No new concerns. Pt did not bring MAR from facility and she is unfamiliar with medications.

## 2021-10-17 ENCOUNTER — Inpatient Hospital Stay: Payer: Medicare (Managed Care)

## 2021-10-17 ENCOUNTER — Inpatient Hospital Stay (HOSPITAL_BASED_OUTPATIENT_CLINIC_OR_DEPARTMENT_OTHER): Payer: Medicare (Managed Care) | Admitting: Oncology

## 2021-10-17 ENCOUNTER — Encounter: Payer: Self-pay | Admitting: Oncology

## 2021-10-17 ENCOUNTER — Other Ambulatory Visit: Payer: Self-pay

## 2021-10-17 VITALS — BP 119/85 | HR 89 | Temp 96.9°F | Resp 18 | Wt 192.0 lb

## 2021-10-17 VITALS — BP 129/81 | HR 78

## 2021-10-17 DIAGNOSIS — R7989 Other specified abnormal findings of blood chemistry: Secondary | ICD-10-CM

## 2021-10-17 DIAGNOSIS — C55 Malignant neoplasm of uterus, part unspecified: Secondary | ICD-10-CM

## 2021-10-17 DIAGNOSIS — Z5111 Encounter for antineoplastic chemotherapy: Secondary | ICD-10-CM | POA: Diagnosis not present

## 2021-10-17 DIAGNOSIS — D508 Other iron deficiency anemias: Secondary | ICD-10-CM | POA: Diagnosis not present

## 2021-10-17 LAB — CBC WITH DIFFERENTIAL/PLATELET
Abs Immature Granulocytes: 0.03 10*3/uL (ref 0.00–0.07)
Basophils Absolute: 0 10*3/uL (ref 0.0–0.1)
Basophils Relative: 1 %
Eosinophils Absolute: 0.1 10*3/uL (ref 0.0–0.5)
Eosinophils Relative: 1 %
HCT: 38.8 % (ref 36.0–46.0)
Hemoglobin: 12.1 g/dL (ref 12.0–15.0)
Immature Granulocytes: 0 %
Lymphocytes Relative: 27 %
Lymphs Abs: 2.1 10*3/uL (ref 0.7–4.0)
MCH: 24.2 pg — ABNORMAL LOW (ref 26.0–34.0)
MCHC: 31.2 g/dL (ref 30.0–36.0)
MCV: 77.8 fL — ABNORMAL LOW (ref 80.0–100.0)
Monocytes Absolute: 0.8 10*3/uL (ref 0.1–1.0)
Monocytes Relative: 10 %
Neutro Abs: 4.9 10*3/uL (ref 1.7–7.7)
Neutrophils Relative %: 61 %
Platelets: 346 10*3/uL (ref 150–400)
RBC: 4.99 MIL/uL (ref 3.87–5.11)
RDW: 22 % — ABNORMAL HIGH (ref 11.5–15.5)
WBC: 8 10*3/uL (ref 4.0–10.5)
nRBC: 0 % (ref 0.0–0.2)

## 2021-10-17 LAB — COMPREHENSIVE METABOLIC PANEL
ALT: 14 U/L (ref 0–44)
AST: 24 U/L (ref 15–41)
Albumin: 3.6 g/dL (ref 3.5–5.0)
Alkaline Phosphatase: 148 U/L — ABNORMAL HIGH (ref 38–126)
Anion gap: 12 (ref 5–15)
BUN: 23 mg/dL (ref 8–23)
CO2: 23 mmol/L (ref 22–32)
Calcium: 8.9 mg/dL (ref 8.9–10.3)
Chloride: 99 mmol/L (ref 98–111)
Creatinine, Ser: 1.33 mg/dL — ABNORMAL HIGH (ref 0.44–1.00)
GFR, Estimated: 41 mL/min — ABNORMAL LOW (ref >60)
Glucose, Bld: 112 mg/dL — ABNORMAL HIGH (ref 70–99)
Potassium: 4.8 mmol/L (ref 3.5–5.1)
Sodium: 134 mmol/L — ABNORMAL LOW (ref 135–145)
Total Bilirubin: 0.6 mg/dL (ref 0.3–1.2)
Total Protein: 7.3 g/dL (ref 6.5–8.1)

## 2021-10-17 MED ORDER — LORAZEPAM 2 MG/ML IJ SOLN
0.5000 mg | Freq: Once | INTRAMUSCULAR | Status: AC
  Administered 2021-10-17: 0.5 mg via INTRAVENOUS
  Filled 2021-10-17: qty 1

## 2021-10-17 MED ORDER — PALONOSETRON HCL INJECTION 0.25 MG/5ML
0.2500 mg | Freq: Once | INTRAVENOUS | Status: AC
  Administered 2021-10-17: 0.25 mg via INTRAVENOUS
  Filled 2021-10-17: qty 5

## 2021-10-17 MED ORDER — SODIUM CHLORIDE 0.9 % IV SOLN
Freq: Once | INTRAVENOUS | Status: AC
  Filled 2021-10-17: qty 250

## 2021-10-17 MED ORDER — SODIUM CHLORIDE 0.9 % IV SOLN
150.0000 mg | Freq: Once | INTRAVENOUS | Status: AC
  Administered 2021-10-17: 150 mg via INTRAVENOUS
  Filled 2021-10-17: qty 150

## 2021-10-17 MED ORDER — SODIUM CHLORIDE 0.9 % IV SOLN
10.0000 mg | Freq: Once | INTRAVENOUS | Status: AC
  Administered 2021-10-17: 10 mg via INTRAVENOUS
  Filled 2021-10-17: qty 10

## 2021-10-17 MED ORDER — DIPHENHYDRAMINE HCL 50 MG/ML IJ SOLN
50.0000 mg | Freq: Once | INTRAMUSCULAR | Status: AC
  Administered 2021-10-17: 50 mg via INTRAVENOUS
  Filled 2021-10-17: qty 1

## 2021-10-17 MED ORDER — SODIUM CHLORIDE 0.9 % IV SOLN
100.0000 mg/m2 | Freq: Once | INTRAVENOUS | Status: AC
  Administered 2021-10-17: 198 mg via INTRAVENOUS
  Filled 2021-10-17: qty 33

## 2021-10-17 MED ORDER — FAMOTIDINE 20 MG IN NS 100 ML IVPB
20.0000 mg | Freq: Once | INTRAVENOUS | Status: AC
  Administered 2021-10-17: 20 mg via INTRAVENOUS
  Filled 2021-10-17: qty 20

## 2021-10-17 MED ORDER — SODIUM CHLORIDE 0.9 % IV SOLN
INTRAVENOUS | Status: DC
  Filled 2021-10-17 (×2): qty 250

## 2021-10-17 MED ORDER — SODIUM CHLORIDE 0.9 % IV SOLN
370.0000 mg | Freq: Once | INTRAVENOUS | Status: AC
  Administered 2021-10-17: 370 mg via INTRAVENOUS
  Filled 2021-10-17: qty 37

## 2021-10-17 NOTE — Progress Notes (Signed)
Hematology/Oncology progress note [facility] Telephone:([phone] [phone]   Patient Care Team: Dionicia Abler, NP as PCP - General Clent Jacks, RN as Oncology Nurse Navigator Earlie Server, MD as Consulting Physician (Hematology and Oncology)  REFERRING PROVIDER: Dionicia Abler, NP  CHIEF COMPLAINTS/REASON FOR VISIT:  Follow-up for treatment of uterus neoplasm.  HISTORY OF PRESENTING ILLNESS:   Alexis Mclaughlin is a  75 y.o.  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  She was also noted to have lactic acidosis, leukocytosis, tachycardia. 06/01/2021, CT scan showed a markedly enlarged uterus with extensive fluid and air, consistent with intrauterine abscess. Blood cultures obtained on admission grew Clostridium perfringens.  She was treated with IV antibiotics, she passed a large amount of tissue vaginally on 06/02/2021-pathology showed necrotic poorly differentiated neoplasm.  She was transferred to [facility] for further management.  Patient was seen by gynecology oncology. 06/05/2021, status post robotic assisted laparoscopic total hysterectomy with bilateral salpingo-oophorectomy, excision of posterior cervix and upper vaginal, cystoscopy, D&C. Findings:  On EUA, enlarged 18cm bulbous uterus, minimally mobile. ON speculum exam, enlarged cervix almost entirely necrotic with necrotic tissue prolapsing through dilated cervical os. Necrotic tissue on D&C, no significant return of tissue and gritty texture felt almost immediately. Cervix approximately 6cm. On intra-abdominal entry, normal upper abdominal survey including liver edge, stomach, and omentum. Normal appearing small and large bowel. Uterus 18cm, bulbous at fundus. Atrophic appearing adnexa. No breach of uterine  capsule by tumor. Retroperitoneum significantly fibrotic and edematous, no obvious lymphadenopathy. After delivery of uterine specimen, significantly necrotic tissue noted along posterior colpotomy, likely with residual cervix in situ. After rectovaginal space created, additional posterior cervix excised down to level of healthy vagina. On cystoscopy, bladder dome intact, good efflux noted from bilateral ureteral orifices.   Pathology showed poorly differentiated malignant neoplasm with extensive necrosis and associated with superior of inflammation.  Tumor invades deep myometrium but does not involve serosal surface.  Cervix with separative inflammation and necrosis-no definitive evidence of tumor.  Right fallopian tube with suppurative inflammation and necrosis.  Benign unremarkable left fallopian tube and bilateral ovaries. Postop, patient gradually improves and was discharged to SNF on 06/08/2021..  06/20/2021, patient was seen by Dr. Berline Lopes for postop follow-up.  She appears physically recovering from her surgery very well. Patient's case was presented to GYN oncology tumor conference by Dr. Berline Lopes. 07/02/2021, repeat CT chest abdomen pelvis showed prominent low retroperitoneal, iliac, pelvic sidewall lymph nodes are unchanged.,  Nonspecific and modestly suspicious for nodal metastasis.  Attention on follow-up.  No noncontrast radiographic evidence of distant metastasis disease in the chest, abdomen or pelvis.  07/03/2021.  Follow-up visit with Dr. Berline Lopes Given that patient's mobility has improved and recovered well from her surgery.  Dr. Berline Lopes recommends adjuvant chemotherapy most likely carboplatin alone or dose reduced carboplatin and paclitaxel.  Since patient lives closer to [facility], patient was referred to establish care with [facility] cancer center for further management.  Patient has baseline dementia, she goes to senior care facility via [facility].   10/05/2021 CT chest abdomen pelvis shows  no recurrence or metastatic disease.  No evidence of pelvic sidewall nodularity or lymphadenopathy.  Incidental findings of cholelithiasis and benign left renal cyst. I discussed CT findings with patient's gynecology oncologist Dr. Berline Lopes and she recommends to continue another 3  cycles of adjuvant chemotherapy if the patient is able to tolerate.  INTERVAL HISTORY Alexis Mclaughlin is a 75 y.o. female who has above history reviewed by me today presents for follow up visit for evaluation prior to adjuvant chemotherapy. Patient was accompanied by staff from [facility] program.  Patient is a poor historian.  Most history obtained from staff. Patient has had some nausea since last visit and antiemetics helped relieving the symptoms. Intermittent loose stool episodes, improved after taking Imodium.  She did not eat much.  Patient denies any nausea vomiting diarrhea today.  No fever or chills.  Review of Systems  Unable to perform ROS: Dementia  Constitutional:  Negative for fever.  Genitourinary:  Negative for vaginal bleeding and vaginal discharge.    MEDICAL HISTORY:  Past Medical History:  Diagnosis Date   Arthritis    Constipation    Dementia ([place])    Diabetes mellitus without complication ([place])    GERD (gastroesophageal reflux disease)    Hypertension    Hypothyroidism     SURGICAL HISTORY: Past Surgical History:  Procedure Laterality Date   BREAST BIOPSY Right 09/02/2018   affirm stereo/path pending   CYSTOSCOPY  06/05/2021   Procedure: CYSTOSCOPY;  Surgeon: [name], MD;  Location: [facility];  Service: Gynecology;;   DILATION AND CURETTAGE OF UTERUS N/A 06/05/2021   Procedure: DILATATION AND CURETTAGE UNDER LAPAROSCOPIC GUIDANCE;  Surgeon: [name], MD;  Location: [facility];  Service: Gynecology;  Laterality: N/A;   LAPAROSCOPY N/A 06/05/2021   Procedure: LAPAROSCOPY OPERATIVE;  Surgeon: [name], MD;  Location: [facility];  Service: Gynecology;  Laterality: N/A;    ROBOTIC ASSISTED TOTAL HYSTERECTOMY WITH BILATERAL SALPINGO OOPHERECTOMY N/A 06/05/2021   Procedure: XI ROBOTIC ASSISTED TOTAL HYSTERECTOMY WITH BILATERAL SALPINGO OOPHORECTOMY;  Surgeon: [name], MD;  Location: [facility];  Service: Gynecology;  Laterality: N/A;   TUBAL LIGATION      SOCIAL HISTORY: Social History   Socioeconomic History   Marital status: Married    Spouse name: Not on file   Number of children: Not on file   Years of education: Not on file   Highest education level: Not on file  Occupational History   Not on file  Tobacco Use   Smoking status: Former    Types: Cigarettes   Smokeless tobacco: Never   Tobacco comments:    Quit at least 30 years ago (prior to 1990)  Vaping Use   Vaping Use: Never used  Substance and Sexual Activity   Alcohol use: Not Currently   Drug use: Never   Sexual activity: Not Currently  Other Topics Concern   Not on file  Social History Narrative   Not on file   Social Determinants of Health   Financial Resource Strain: Not on file  Food Insecurity: Not on file  Transportation Needs: Not on file  Physical Activity: Not on file  Stress: Not on file  Social Connections: Not on file  Intimate Partner Violence: Not on file    FAMILY HISTORY: Family History  Problem Relation Age of Onset   CAD Mother    CAD Father    Cancer Neg Hx     ALLERGIES:  is allergic to ace inhibitors.  MEDICATIONS:  Current Outpatient Medications  Medication Sig Dispense Refill   amLODipine (NORVASC) 10 MG tablet Take 10 mg by mouth daily.     ANTI-ITCH lotion Apply topically.     aspirin EC 81 MG tablet Take 81 mg by  mouth daily. Swallow whole.     cholecalciferol (VITAMIN D3) 25 MCG (1000 UNIT) tablet Take 1,000 Units by mouth daily.     diclofenac Sodium (VOLTAREN) 1 % GEL SMARTSIG:Gram(s) Topical Twice Daily     docusate sodium (COLACE) 100 MG capsule Take 100 mg by mouth daily.     DULoxetine (CYMBALTA) 30 MG capsule Take 30 mg by  mouth daily.     DULoxetine (CYMBALTA) 60 MG capsule Take 60 mg by mouth daily.     ferrous sulfate 325 (65 FE) MG EC tablet Take 1 tablet (325 mg total) by mouth daily. 30 tablet 1   fluticasone (FLONASE) 50 MCG/ACT nasal spray Place 2 sprays into both nostrils daily.     furosemide (LASIX) 20 MG tablet Take 20 mg by mouth.     gabapentin (NEURONTIN) 100 MG capsule Take 1 capsule (100 mg total) by mouth at bedtime for 30 days, THEN 2 capsules (200 mg total) at bedtime for 30 days, THEN 3 capsules (300 mg total) at bedtime. Follow written titration schedule.. 180 capsule 0   Insulin Glargine (BASAGLAR KWIKPEN) 100 UNIT/ML Inject 14 Units into the skin daily.     levothyroxine (SYNTHROID) 50 MCG tablet Take 50 mcg by mouth daily before breakfast.     lidocaine (LIDODERM) 5 % Place 2 patches onto the skin daily as needed (pain). Remove & Discard patch within 12 hours or as directed by MD     LIPITOR 80 MG tablet Take 1 tablet by mouth daily.     LORazepam (ATIVAN) 0.5 MG tablet SMARTSIG:0.5 Milligram(s) By Mouth Every Morning     metFORMIN (GLUCOPHAGE-XR) 500 MG 24 hr tablet Take 500 mg by mouth 2 (two) times daily.     metoprolol succinate (TOPROL-XL) 25 MG 24 hr tablet Take 1 tablet (25 mg total) by mouth daily. 30 tablet 1   miconazole (MICOTIN) 2 % cream Apply 1 application topically at bedtime.     nystatin (MYCOSTATIN/NYSTOP) powder Apply 1 application topically 2 (two) times daily.     omeprazole (PRILOSEC) 20 MG capsule Take 20 mg by mouth daily.     ondansetron (ZOFRAN) 8 MG tablet Take 1 tablet (8 mg total) by mouth 2 (two) times daily as needed. Start on the third day after chemotherapy. 30 tablet 1   polyethylene glycol (MIRALAX / GLYCOLAX) 17 g packet Take 17 g by mouth 2 (two) times daily. 14 each 0   prochlorperazine (COMPAZINE) 10 MG tablet Take 1 tablet (10 mg total) by mouth every 6 (six) hours as needed (Nausea or vomiting). 30 tablet 1   sodium chloride (OCEAN) 0.65 % nasal spray  Place 1 spray into the nose 2 (two) times daily as needed for congestion.     traMADol (ULTRAM) 50 MG tablet Take by mouth.     traZODone (DESYREL) 50 MG tablet Take 25 mg by mouth at bedtime.     trolamine salicylate (ASPERCREME/ALOE) 10 % cream Apply 1 application topically every 6 (six) hours as needed for muscle pain.     Witch Hazel (EQ HYGIENIC CLEANSING WIPES) PADS Apply 1 application topically 2 (two) times daily.     No current facility-administered medications for this visit.   Facility-Administered Medications Ordered in Other Visits  Medication Dose Route Frequency Provider Last Rate Last Admin   0.9 %  sodium chloride infusion   Intravenous Continuous Earlie Server, MD 999 mL/hr at 10/17/21 1044 New Bag at 10/17/21 1044   CARBOplatin (PARAPLATIN) 370 mg in sodium  chloride 0.9 % 250 mL chemo infusion  370 mg Intravenous Once Earlie Server, MD       PACLitaxel (TAXOL) 198 mg in sodium chloride 0.9 % 250 mL chemo infusion (> 80mg /m2)  100 mg/m2 (Order-Specific) Intravenous Once Earlie Server, MD 94 mL/hr at 10/17/21 1216 198 mg at 10/17/21 1216     PHYSICAL EXAMINATION: ECOG PERFORMANCE STATUS: 2 - Symptomatic, <50% confined to bed Vitals:   10/17/21 0936  BP: 119/85  Pulse: 89  Resp: 18  Temp: (!) 96.9 F (36.1 C)   Filed Weights   10/17/21 0936  Weight: 192 lb (87.1 kg)      Physical Exam Constitutional:      General: She is not in acute distress.    Appearance: She is obese.     Comments: Patient sits in a wheelchair today.  HENT:     Head: Normocephalic and atraumatic.  Eyes:     General: No scleral icterus. Cardiovascular:     Rate and Rhythm: Normal rate and regular rhythm.     Heart sounds: Normal heart sounds.  Pulmonary:     Effort: Pulmonary effort is normal. No respiratory distress.     Breath sounds: No wheezing.  Abdominal:     General: Bowel sounds are normal. There is no distension.     Palpations: Abdomen is soft.  Musculoskeletal:        General: No  deformity. Normal range of motion.     Cervical back: Normal range of motion and neck supple.  Skin:    General: Skin is warm and dry.     Findings: No erythema or rash.  Neurological:     Mental Status: She is alert. Mental status is at baseline.     Cranial Nerves: No cranial nerve deficit.     Coordination: Coordination normal.     Comments: Agitated. Orientated x 2 name and place.     LABORATORY DATA:  I have reviewed the data as listed Lab Results  Component Value Date   WBC 8.0 10/17/2021   HGB 12.1 10/17/2021   HCT 38.8 10/17/2021   MCV 77.8 (L) 10/17/2021   PLT 346 10/17/2021   Recent Labs    09/18/21 0814 10/09/21 0756 10/17/21 0801  NA 135 133* 134*  K 4.2 4.6 4.8  CL 100 101 99  CO2 22 22 23   GLUCOSE 144* 170* 112*  BUN 14 21 23   CREATININE 1.06* 1.21* 1.33*  CALCIUM 8.7* 8.8* 8.9  GFRNONAA 54* 46* 41*  PROT 7.2 7.5 7.3  ALBUMIN 3.7 3.7 3.6  AST 30 34 24  ALT 19 18 14   ALKPHOS 169* 174* 148*  BILITOT 0.4 0.6 0.6    Iron/TIBC/Ferritin/ %Sat    Component Value Date/Time   IRON 28 07/10/2021 1147   IRON 54 07/05/2014 0958   TIBC 249 (L) 07/10/2021 1147   TIBC 334 07/05/2014 0958   FERRITIN 180 07/10/2021 1147   FERRITIN 159 07/05/2014 0958   IRONPCTSAT 11 07/10/2021 1147   IRONPCTSAT 16 07/05/2014 0958       RADIOGRAPHIC STUDIES: I have personally reviewed the radiological images as listed and agreed with the findings in the report. CT CHEST ABDOMEN PELVIS W CONTRAST  Result Date: 10/08/2021 CLINICAL DATA:  Uterine carcinoma staging. Patient status post hysterectomy EXAM: CT CHEST, ABDOMEN, AND PELVIS WITH CONTRAST TECHNIQUE: Multidetector CT imaging of the chest, abdomen and pelvis was performed following the standard protocol during bolus administration of intravenous contrast. CONTRAST:  173mL OMNIPAQUE IOHEXOL  350 MG/ML SOLN COMPARISON:  None. FINDINGS: CT CHEST FINDINGS Cardiovascular: No significant vascular findings. Normal heart size.  No pericardial effusion. Mediastinum/Nodes: No axillary or supraclavicular adenopathy. No mediastinal or hilar adenopathy. No pericardial fluid. Esophagus normal. Lungs/Pleura: No suspicious pulmonary nodules. Normal pleural. Airways normal. Musculoskeletal: No aggressive osseous lesion. CT ABDOMEN AND PELVIS FINDINGS Hepatobiliary: No focal hepatic lesion. Several gallstones present. No gallbladder inflammation. Pancreas: Pancreas is normal. No ductal dilatation. No pancreatic inflammation. Spleen: Normal spleen Adrenals/urinary tract: Adrenal glands and kidneys are normal. Simple fluid attenuation cyst exophytic from the LEFT kidney. The ureters and bladder normal. Stomach/Bowel: Stomach, small bowel, appendix, and cecum are normal. The colon and rectosigmoid colon are normal. Vascular/Lymphatic: Abdominal aorta is normal caliber. There is no retroperitoneal or periportal lymphadenopathy. No pelvic lymphadenopathy. Reproductive: Post hysterectomy. No pelvic sidewall nodularity. No pelvic lymphadenopathy. Other: No peritoneal disease Musculoskeletal: No aggressive osseous lesion. IMPRESSION: 1. No evidence of metastatic uterine cancer. 2. Post hysterectomy. No evidence of pelvic sidewall nodularity or pelvic lymphadenopathy. 3. Incidental finding of cholelithiasis and benign LEFT renal cyst. Electronically Signed   By: Suzy Bouchard M.D.   On: 10/08/2021 12:29      ASSESSMENT & PLAN:  1. Encounter for antineoplastic chemotherapy   2. Malignant neoplasm of uterus, unspecified site ([place])   3. Other iron deficiency anemia   4. Elevated serum creatinine    #Poorly differentiated neoplasm of uterus S/p 3 cycles of adjuvant carboplatin Taxol.  CT scan shows no metastatic disease Labs reviewed and discussed with patient. Proceed with cycle 4 adjuvant carboplatin/Taxol.  Dose decreased Taxol to 100mg /m2   #Elevated creatinine, patient will be given 1 L of IV fluid normal saline.  I also previously spoke  with her primary care provider.  Advised patient to be assessed for p.o. intake and IV fluid if needed at her facility. Encourage oral hydration.  # Iron deficiency anemia, hemoglobin  is stable.  Continue ferrous sulfate 325 mg once daily.  #Dementia, hopefully patient will continue to cope with chemotherapy.  Managed by primary care provider, Ativan 0.5 mg PRN prior to the chemo if needed for agitation.   # Alkaline phosphate elevation chronic.   All questions were answered. The patient knows to call the clinic with any problems questions or concerns.  cc Dionicia Abler, NP  Return of visit:  3 weeks  for evaluation prior to next chemo  Earlie Server, MD, PhD 10/17/2021

## 2021-10-17 NOTE — Patient Instructions (Signed)
C[facility]ER CENTER [place] [facility]COLOG[facility]  Discharge Instructions: Thank you for choosing [facility] to provide your oncology and hematology care.  If you have a lab appointment with the Cancer Center, please go directly to the Cancer Center and check in at the registration area.  Wear comfortable clothing and clothing appropriate for easy access to any Portacath or PICC line.   We strive to give you quality time with your provider. You may need to reschedule your appointment if you arrive late (15 or more minutes).  Arriving late affects you and other patients whose appointments are after yours.  Also, if you miss three or more appointments without notifying the office, you may be dismissed from the clinic at the provider's discretion.      For prescription refill requests, have your pharmacy contact our office and allow 72 hours for refills to be completed.    Today you received the following chemotherapy and/or immunotherapy agents Taxol and Carboplatin       To help prevent nausea and vomiting after your treatment, we encourage you to take your nausea medication as directed.  BELOW ARE SYMPTOMS THAT SHOULD BE REPORTED IMMEDIATELY: *FEVER GREATER THAN 100.4 F (38 C) OR HIGHER *CHILLS OR SWEATING *NAUSEA AND VOMITING THAT IS NOT CONTROLLED WITH YOUR NAUSEA MEDICATION *UNUSUAL SHORTNESS OF BREATH *UNUSUAL BRUISING OR BLEEDING *URINARY PROBLEMS (pain or burning when urinating, or frequent urination) *BOWEL PROBLEMS (unusual diarrhea, constipation, pain near the anus) TENDERNESS IN MOUTH AND THROAT WITH OR WITHOUT PRESENCE OF ULCERS (sore throat, sores in mouth, or a toothache) UNUSUAL RASH, SWELLING OR PAIN  UNUSUAL VAGINAL DISCHARGE OR ITCHING   Items with * indicate a potential emergency and should be followed up as soon as possible or go to the Emergency Department if any problems should occur.  Please show the CHEMOTHERAPY ALERT CARD or IMMUNOTHERAPY ALERT CARD  at check-in to the Emergency Department and triage nurse.  Should you have questions after your visit or need to cancel or reschedule your appointment, please contact [facility]CER CENTER [place] [facility] ONCOL[facility]  [phone] and follow the prompts.  Office hours are 8:00 a.m. to 4:30 p.m. Monday - Friday. Please note that voicemails left after 4:00 p.m. may not be returned until the following business day.  We are closed weekends and major holidays. You have access to a nurse at all times for urgent questions. Please call the main number to the clinic [phone] and follow the prompts.  For any non-urgent questions, you may also contact your provider using MyChart. We now offer e-Visits for anyone 18 and older to request care online for non-urgent symptoms. For details visit [email or web address].   Also download the MyChart app! Go to the app store, search "MyChart", open the app, select [facility], and log in with your MyChart username and password.  Due to Covid, a mask is required upon entering the hospital/clinic. If you do not have a mask, one will be given to you upon arrival. For doctor visits, patients may have 1 support person aged 18 or older with them. For treatment visits, patients cannot have anyone with them due to current Covid guidelines and our immunocompromised population.  

## 2021-10-17 NOTE — Progress Notes (Signed)
Per MD will use new updated weight for Carboplatin calculation.

## 2021-10-17 NOTE — Progress Notes (Signed)
Pt here for follow up. Pt reports food doesn't taste good, so she has not been eating much. Unable to review meds, but facilty will fax MAR.

## 2021-11-07 ENCOUNTER — Inpatient Hospital Stay: Payer: Medicare (Managed Care) | Attending: Oncology

## 2021-11-07 ENCOUNTER — Inpatient Hospital Stay (HOSPITAL_BASED_OUTPATIENT_CLINIC_OR_DEPARTMENT_OTHER): Payer: Medicare (Managed Care) | Admitting: Oncology

## 2021-11-07 ENCOUNTER — Encounter: Payer: Self-pay | Admitting: Oncology

## 2021-11-07 ENCOUNTER — Other Ambulatory Visit: Payer: Self-pay

## 2021-11-07 ENCOUNTER — Other Ambulatory Visit: Payer: Self-pay | Admitting: Oncology

## 2021-11-07 ENCOUNTER — Inpatient Hospital Stay: Payer: Medicare (Managed Care)

## 2021-11-07 VITALS — BP 117/82 | HR 112 | Temp 97.7°F | Ht 62.0 in | Wt 194.0 lb

## 2021-11-07 VITALS — HR 89

## 2021-11-07 DIAGNOSIS — T451X5A Adverse effect of antineoplastic and immunosuppressive drugs, initial encounter: Secondary | ICD-10-CM | POA: Insufficient documentation

## 2021-11-07 DIAGNOSIS — F03911 Unspecified dementia, unspecified severity, with agitation: Secondary | ICD-10-CM | POA: Diagnosis not present

## 2021-11-07 DIAGNOSIS — C55 Malignant neoplasm of uterus, part unspecified: Secondary | ICD-10-CM

## 2021-11-07 DIAGNOSIS — D6481 Anemia due to antineoplastic chemotherapy: Secondary | ICD-10-CM | POA: Diagnosis not present

## 2021-11-07 DIAGNOSIS — Z5111 Encounter for antineoplastic chemotherapy: Secondary | ICD-10-CM

## 2021-11-07 DIAGNOSIS — R7989 Other specified abnormal findings of blood chemistry: Secondary | ICD-10-CM | POA: Insufficient documentation

## 2021-11-07 LAB — CBC WITH DIFFERENTIAL/PLATELET
Abs Immature Granulocytes: 0.02 10*3/uL (ref 0.00–0.07)
Basophils Absolute: 0 10*3/uL (ref 0.0–0.1)
Basophils Relative: 1 %
Eosinophils Absolute: 0.1 10*3/uL (ref 0.0–0.5)
Eosinophils Relative: 2 %
HCT: 33.6 % — ABNORMAL LOW (ref 36.0–46.0)
Hemoglobin: 10.6 g/dL — ABNORMAL LOW (ref 12.0–15.0)
Immature Granulocytes: 0 %
Lymphocytes Relative: 23 %
Lymphs Abs: 1.6 10*3/uL (ref 0.7–4.0)
MCH: 24.9 pg — ABNORMAL LOW (ref 26.0–34.0)
MCHC: 31.5 g/dL (ref 30.0–36.0)
MCV: 79.1 fL — ABNORMAL LOW (ref 80.0–100.0)
Monocytes Absolute: 0.8 10*3/uL (ref 0.1–1.0)
Monocytes Relative: 11 %
Neutro Abs: 4.5 10*3/uL (ref 1.7–7.7)
Neutrophils Relative %: 63 %
Platelets: 264 10*3/uL (ref 150–400)
RBC: 4.25 MIL/uL (ref 3.87–5.11)
RDW: 23.4 % — ABNORMAL HIGH (ref 11.5–15.5)
WBC: 7.1 10*3/uL (ref 4.0–10.5)
nRBC: 0 % (ref 0.0–0.2)

## 2021-11-07 LAB — COMPREHENSIVE METABOLIC PANEL
ALT: 15 U/L (ref 0–44)
AST: 30 U/L (ref 15–41)
Albumin: 3.3 g/dL — ABNORMAL LOW (ref 3.5–5.0)
Alkaline Phosphatase: 147 U/L — ABNORMAL HIGH (ref 38–126)
Anion gap: 14 (ref 5–15)
BUN: 21 mg/dL (ref 8–23)
CO2: 23 mmol/L (ref 22–32)
Calcium: 8.5 mg/dL — ABNORMAL LOW (ref 8.9–10.3)
Chloride: 97 mmol/L — ABNORMAL LOW (ref 98–111)
Creatinine, Ser: 1.01 mg/dL — ABNORMAL HIGH (ref 0.44–1.00)
GFR, Estimated: 58 mL/min — ABNORMAL LOW (ref >60)
Glucose, Bld: 162 mg/dL — ABNORMAL HIGH (ref 70–99)
Potassium: 4.3 mmol/L (ref 3.5–5.1)
Sodium: 134 mmol/L — ABNORMAL LOW (ref 135–145)
Total Bilirubin: 0.5 mg/dL (ref 0.3–1.2)
Total Protein: 7.1 g/dL (ref 6.5–8.1)

## 2021-11-07 LAB — IRON AND TIBC
Iron: 71 ug/dL (ref 28–170)
Saturation Ratios: 37 % — ABNORMAL HIGH (ref 10.4–31.8)
TIBC: 192 ug/dL — ABNORMAL LOW (ref 250–450)
UIBC: 121 ug/dL

## 2021-11-07 LAB — RETIC PANEL
Immature Retic Fract: 17.3 % — ABNORMAL HIGH (ref 2.3–15.9)
RBC.: 4.28 MIL/uL (ref 3.87–5.11)
Retic Count, Absolute: 65.9 10*3/uL (ref 19.0–186.0)
Retic Ct Pct: 1.5 % (ref 0.4–3.1)
Reticulocyte Hemoglobin: 30 pg (ref >27.9)

## 2021-11-07 LAB — FERRITIN: Ferritin: 845 ng/mL — ABNORMAL HIGH (ref 11–307)

## 2021-11-07 MED ORDER — PALONOSETRON HCL INJECTION 0.25 MG/5ML
0.2500 mg | Freq: Once | INTRAVENOUS | Status: AC
  Administered 2021-11-07: 0.25 mg via INTRAVENOUS
  Filled 2021-11-07: qty 5

## 2021-11-07 MED ORDER — SODIUM CHLORIDE 0.9 % IV SOLN
Freq: Once | INTRAVENOUS | Status: AC
  Filled 2021-11-07: qty 250

## 2021-11-07 MED ORDER — SODIUM CHLORIDE 0.9 % IV SOLN
460.0000 mg | Freq: Once | INTRAVENOUS | Status: AC
  Administered 2021-11-07: 460 mg via INTRAVENOUS
  Filled 2021-11-07: qty 46

## 2021-11-07 MED ORDER — SODIUM CHLORIDE 0.9 % IV SOLN
150.0000 mg | Freq: Once | INTRAVENOUS | Status: AC
  Administered 2021-11-07: 150 mg via INTRAVENOUS
  Filled 2021-11-07: qty 150

## 2021-11-07 MED ORDER — FAMOTIDINE 20 MG IN NS 100 ML IVPB
20.0000 mg | Freq: Once | INTRAVENOUS | Status: AC
  Administered 2021-11-07: 20 mg via INTRAVENOUS
  Filled 2021-11-07: qty 20

## 2021-11-07 MED ORDER — SODIUM CHLORIDE 0.9 % IV SOLN
100.0000 mg/m2 | Freq: Once | INTRAVENOUS | Status: AC
  Administered 2021-11-07: 198 mg via INTRAVENOUS
  Filled 2021-11-07: qty 33

## 2021-11-07 MED ORDER — SODIUM CHLORIDE 0.9 % IV SOLN
10.0000 mg | Freq: Once | INTRAVENOUS | Status: AC
  Administered 2021-11-07: 10 mg via INTRAVENOUS
  Filled 2021-11-07: qty 10

## 2021-11-07 MED ORDER — DIPHENHYDRAMINE HCL 50 MG/ML IJ SOLN
50.0000 mg | Freq: Once | INTRAMUSCULAR | Status: AC
  Administered 2021-11-07: 50 mg via INTRAVENOUS
  Filled 2021-11-07: qty 1

## 2021-11-07 NOTE — Patient Instructions (Signed)
CANCER CENTER [place] [facility] MEDICAL ONCOLOGY  Discharge Instructions: Thank you for choosing [facility] to provide your oncology and hematology care.  If you have a lab appointment with the Cancer Center, please go directly to the Cancer Center and check in at the registration area.  Wear comfortable clothing and clothing appropriate for easy access to any Portacath or PICC line.   We strive to give you quality time with your provider. You may need to reschedule your appointment if you arrive late (15 or more minutes).  Arriving late affects you and other patients whose appointments are after yours.  Also, if you miss three or more appointments without notifying the office, you may be dismissed from the clinic at the provider's discretion.      For prescription refill requests, have your pharmacy contact our office and allow 72 hours for refills to be completed.    Today you received the following chemotherapy and/or immunotherapy agents Taxol & Carboplatin      To help prevent nausea and vomiting after your treatment, we encourage you to take your nausea medication as directed.  BELOW ARE SYMPTOMS THAT SHOULD BE REPORTED IMMEDIATELY: *FEVER GREATER THAN 100.4 F (38 C) OR HIGHER *CHILLS OR SWEATING *NAUSEA AND VOMITING THAT IS NOT CONTROLLED WITH YOUR NAUSEA MEDICATION *UNUSUAL SHORTNESS OF BREATH *UNUSUAL BRUISING OR BLEEDING *URINARY PROBLEMS (pain or burning when urinating, or frequent urination) *BOWEL PROBLEMS (unusual diarrhea, constipation, pain near the anus) TENDERNESS IN MOUTH AND THROAT WITH OR WITHOUT PRESENCE OF ULCERS (sore throat, sores in mouth, or a toothache) UNUSUAL RASH, SWELLING OR PAIN  UNUSUAL VAGINAL DISCHARGE OR ITCHING   Items with * indicate a potential emergency and should be followed up as soon as possible or go to the Emergency Department if any problems should occur.  Please show the CHEMOTHERAPY ALERT CARD or IMMUNOTHERAPY ALERT CARD at  check-in to the Emergency Department and triage nurse.  Should you have questions after your visit or need to cancel or reschedule your appointment, please contact [facility] [place] [facility] ONCOL[facility]  [phone] and follow the prompts.  Office hours are 8:00 a.m. to 4:30 p.m. Monday - Friday. Please note that voicemails left after 4:00 p.m. may not be returned until the following business day.  We are closed weekends and major holidays. You have access to a nurse at all times for urgent questions. Please call the main number to the clinic [phone] and follow the prompts.  For any non-urgent questions, you may also contact your provider using MyChart. We now offer e-Visits for anyone 18 and older to request care online for non-urgent symptoms. For details visit [email or web address].   Also download the MyChart app! Go to the app store, search "MyChart", open the app, select [facility], and log in with your MyChart username and password.  Due to Covid, a mask is required upon entering the hospital/clinic. If you do not have a mask, one will be given to you upon arrival. For doctor visits, patients may have 1 support person aged 18 or older with them. For treatment visits, patients cannot have anyone with them due to current Covid guidelines and our immunocompromised population.  

## 2021-11-07 NOTE — Progress Notes (Signed)
Hematology/Oncology progress note [phone] [phone]   Patient Care Team: Dionicia Abler, NP as PCP - General Clent Jacks, RN as Oncology Nurse Navigator Earlie Server, MD as Consulting Physician (Hematology and Oncology)  REFERRING PROVIDER: Dionicia Abler, NP  CHIEF COMPLAINTS/REASON FOR VISIT:  Follow-up for treatment of uterus neoplasm.  HISTORY OF PRESENTING ILLNESS:   Alexis Mclaughlin is a  75 y.o.  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  She was also noted to have lactic acidosis, leukocytosis, tachycardia. 06/01/2021, CT scan showed a markedly enlarged uterus with extensive fluid and air, consistent with intrauterine abscess. Blood cultures obtained on admission grew Clostridium perfringens.  She was treated with IV antibiotics, she passed a large amount of tissue vaginally on 06/02/2021-pathology showed necrotic poorly differentiated neoplasm.  She was transferred to [facility] for further management.  Patient was seen by gynecology oncology. 06/05/2021, status post robotic assisted laparoscopic total hysterectomy with bilateral salpingo-oophorectomy, excision of posterior cervix and upper vaginal, cystoscopy, D&C. Findings:  On EUA, enlarged 18cm bulbous uterus, minimally mobile. ON speculum exam, enlarged cervix almost entirely necrotic with necrotic tissue prolapsing through dilated cervical os. Necrotic tissue on D&C, no significant return of tissue and gritty texture felt almost immediately. Cervix approximately 6cm. On intra-abdominal entry, normal upper abdominal survey including liver edge, stomach, and omentum. Normal appearing small and large bowel. Uterus 18cm, bulbous at fundus. Atrophic appearing adnexa. No breach of uterine capsule by tumor. Retroperitoneum  significantly fibrotic and edematous, no obvious lymphadenopathy. After delivery of uterine specimen, significantly necrotic tissue noted along posterior colpotomy, likely with residual cervix in situ. After rectovaginal space created, additional posterior cervix excised down to level of healthy vagina. On cystoscopy, bladder dome intact, good efflux noted from bilateral ureteral orifices.   Pathology showed poorly differentiated malignant neoplasm with extensive necrosis and associated with superior of inflammation.  Tumor invades deep myometrium but does not involve serosal surface.  Cervix with separative inflammation and necrosis-no definitive evidence of tumor.  Right fallopian tube with suppurative inflammation and necrosis.  Benign unremarkable left fallopian tube and bilateral ovaries. Postop, patient gradually improves and was discharged to SNF on 06/08/2021..  06/20/2021, patient was seen by Dr. Berline Lopes for postop follow-up.  She appears physically recovering from her surgery very well. Patient's case was presented to GYN oncology tumor conference by Dr. Berline Lopes. 07/02/2021, repeat CT chest abdomen pelvis showed prominent low retroperitoneal, iliac, pelvic sidewall lymph nodes are unchanged.,  Nonspecific and modestly suspicious for nodal metastasis.  Attention on follow-up.  No noncontrast radiographic evidence of distant metastasis disease in the chest, abdomen or pelvis.  07/03/2021.  Follow-up visit with Dr. Berline Lopes Given that patient's mobility has improved and recovered well from her surgery.  Dr. Berline Lopes recommends adjuvant chemotherapy most likely carboplatin alone or dose reduced carboplatin and paclitaxel.  Since patient lives closer to [facility], patient was referred to establish care with [facility] for further management.  Patient has baseline dementia, she goes to senior care facility via [facility].   10/05/2021 CT chest abdomen pelvis shows no recurrence or metastatic  disease.  No evidence of pelvic sidewall nodularity or lymphadenopathy.  Incidental findings of cholelithiasis and benign left renal cyst. I discussed CT findings with patient's gynecology oncologist Dr. Berline Lopes and she recommends to continue another 3 cycles of adjuvant chemotherapy  if the patient is able to tolerate.  INTERVAL HISTORY Alexis Mclaughlin is a 75 y.o. female who has above history reviewed by me today presents for follow up visit for evaluation prior to adjuvant chemotherapy. Patient was accompanied by staff from [facility] program.  Patient is a poor historian.  Most history obtained from staff. Patient described some sensation changes of her toes and fingertips.  Not able to give me much details.  No nausea vomiting diarrhea.  Patient had a fall 2 weeks ago after missing a step when coming out from the past.  She was seen by her primary care provider afterwards. Patient has anxiety, pulse rate was 112 in the clinic room.  She took a dose of home supply of Ativan 0.5 mg in the clinic room. No fever or chills.  Review of Systems  Unable to perform ROS: Dementia  Constitutional:  Negative for fever.  Genitourinary:  Negative for vaginal bleeding and vaginal discharge.    MEDICAL HISTORY:  Past Medical History:  Diagnosis Date   Arthritis    Constipation    Dementia ([place])    Diabetes mellitus without complication ([place])    GERD (gastroesophageal reflux disease)    Hypertension    Hypothyroidism     SURGICAL HISTORY: Past Surgical History:  Procedure Laterality Date   BREAST BIOPSY Right 09/02/2018   affirm stereo/path pending   CYSTOSCOPY  06/05/2021   Procedure: CYSTOSCOPY;  Surgeon: [name], MD;  Location: [facility];  Service: Gynecology;;   DILATION AND CURETTAGE OF UTERUS N/A 06/05/2021   Procedure: DILATATION AND CURETTAGE UNDER LAPAROSCOPIC GUIDANCE;  Surgeon: [name], MD;  Location: [facility];  Service: Gynecology;  Laterality: N/A;   LAPAROSCOPY N/A  06/05/2021   Procedure: LAPAROSCOPY OPERATIVE;  Surgeon: [name], MD;  Location: [facility];  Service: Gynecology;  Laterality: N/A;   ROBOTIC ASSISTED TOTAL HYSTERECTOMY WITH BILATERAL SALPINGO OOPHERECTOMY N/A 06/05/2021   Procedure: XI ROBOTIC ASSISTED TOTAL HYSTERECTOMY WITH BILATERAL SALPINGO OOPHORECTOMY;  Surgeon: [name], MD;  Location: [facility];  Service: Gynecology;  Laterality: N/A;   TUBAL LIGATION      SOCIAL HISTORY: Social History   Socioeconomic History   Marital status: Married    Spouse name: Not on file   Number of children: Not on file   Years of education: Not on file   Highest education level: Not on file  Occupational History   Not on file  Tobacco Use   Smoking status: Former    Types: Cigarettes   Smokeless tobacco: Never   Tobacco comments:    Quit at least 30 years ago (prior to 1990)  Vaping Use   Vaping Use: Never used  Substance and Sexual Activity   Alcohol use: Not Currently   Drug use: Never   Sexual activity: Not Currently  Other Topics Concern   Not on file  Social History Narrative   Not on file   Social Determinants of Health   Financial Resource Strain: Not on file  Food Insecurity: Not on file  Transportation Needs: Not on file  Physical Activity: Not on file  Stress: Not on file  Social Connections: Not on file  Intimate Partner Violence: Not on file    FAMILY HISTORY: Family History  Problem Relation Age of Onset   CAD Mother    CAD Father    Cancer Neg Hx     ALLERGIES:  is allergic to ace inhibitors.  MEDICATIONS:  Current Outpatient Medications  Medication  Sig Dispense Refill   acetaminophen (TYLENOL) 650 MG CR tablet SMARTSIG:1 By Mouth Daily PRN     ANTI-ITCH lotion Apply topically. SARNA LOTION     aspirin EC 81 MG tablet Take 81 mg by mouth daily. Swallow whole.     busPIRone (BUSPAR) 5 MG tablet Take 5 mg by mouth in the morning and at bedtime.     cholecalciferol (VITAMIN D3) 25 MCG (1000  UNIT) tablet Take 1,000 Units by mouth daily.     diclofenac Sodium (VOLTAREN) 1 % GEL SMARTSIG:Gram(s) Topical Twice Daily     docusate sodium (COLACE) 100 MG capsule Take 100 mg by mouth daily.     DULoxetine (CYMBALTA) 30 MG capsule Take 30 mg by mouth daily.     DULoxetine (CYMBALTA) 60 MG capsule Take 60 mg by mouth daily.     Ensure (ENSURE) Take by mouth.     ferrous sulfate 325 (65 FE) MG EC tablet Take 1 tablet (325 mg total) by mouth daily. 30 tablet 1   fluticasone (FLONASE) 50 MCG/ACT nasal spray Place 2 sprays into both nostrils daily.     furosemide (LASIX) 20 MG tablet Take 20 mg by mouth.     gabapentin (NEURONTIN) 100 MG capsule Take 1 capsule (100 mg total) by mouth at bedtime for 30 days, THEN 2 capsules (200 mg total) at bedtime for 30 days, THEN 3 capsules (300 mg total) at bedtime. Follow written titration schedule.. 180 capsule 0   levothyroxine (SYNTHROID) 50 MCG tablet Take 50 mcg by mouth daily before breakfast.     lidocaine (LIDODERM) 5 % Place 2 patches onto the skin daily as needed (pain). Remove & Discard patch within 12 hours or as directed by MD     LIPITOR 80 MG tablet Take 1 tablet by mouth daily.     LORazepam (ATIVAN) 0.5 MG tablet SMARTSIG:0.5 Milligram(s) By Mouth Every Morning     metFORMIN (GLUCOPHAGE-XR) 500 MG 24 hr tablet Take 500 mg by mouth 2 (two) times daily.     metoprolol succinate (TOPROL-XL) 25 MG 24 hr tablet Take 1 tablet (25 mg total) by mouth daily. 30 tablet 1   miconazole (MICOTIN) 2 % cream Apply 1 application topically at bedtime.     nystatin (MYCOSTATIN/NYSTOP) powder Apply 1 application topically 2 (two) times daily.     omeprazole (PRILOSEC) 20 MG capsule Take 20 mg by mouth daily.     ondansetron (ZOFRAN) 8 MG tablet Take 1 tablet (8 mg total) by mouth 2 (two) times daily as needed. Start on the third day after chemotherapy. 30 tablet 1   polyethylene glycol (MIRALAX / GLYCOLAX) 17 g packet Take 17 g by mouth 2 (two) times daily. 14  each 0   potassium chloride SA (KLOR-CON) 20 MEQ tablet Take 20 mEq by mouth daily.     prochlorperazine (COMPAZINE) 10 MG tablet Take 1 tablet (10 mg total) by mouth every 6 (six) hours as needed (Nausea or vomiting). 30 tablet 1   traMADol (ULTRAM) 50 MG tablet Take by mouth.     traZODone (DESYREL) 50 MG tablet Take 25 mg by mouth at bedtime.     trolamine salicylate (ASPERCREME/ALOE) 10 % cream Apply 1 application topically every 6 (six) hours as needed for muscle pain.     amLODipine (NORVASC) 10 MG tablet Take 10 mg by mouth daily. (Patient not taking: Reported on 10/17/2021)     Insulin Glargine (BASAGLAR KWIKPEN) 100 UNIT/ML Inject 14 Units into the skin daily. (  Patient not taking: Reported on 10/17/2021)     sodium chloride (OCEAN) 0.65 % nasal spray Place 1 spray into the nose 2 (two) times daily as needed for congestion. (Patient not taking: Reported on 10/17/2021)     Witch Hazel (EQ HYGIENIC CLEANSING WIPES) PADS Apply 1 application topically 2 (two) times daily. (Patient not taking: Reported on 10/17/2021)     No current facility-administered medications for this visit.     PHYSICAL EXAMINATION: ECOG PERFORMANCE STATUS: 2 - Symptomatic, <50% confined to bed Vitals:   11/07/21 0832  BP: 117/82  Pulse: (!) 112  Temp: 97.7 F (36.5 C)   There were no vitals filed for this visit.     Physical Exam Constitutional:      General: She is not in acute distress.    Appearance: She is obese.     Comments: Patient sits in a wheelchair today.  HENT:     Head: Normocephalic and atraumatic.  Eyes:     General: No scleral icterus. Cardiovascular:     Rate and Rhythm: Normal rate and regular rhythm.     Heart sounds: Normal heart sounds.  Pulmonary:     Effort: Pulmonary effort is normal. No respiratory distress.     Breath sounds: No wheezing.  Abdominal:     General: Bowel sounds are normal. There is no distension.     Palpations: Abdomen is soft.  Musculoskeletal:         General: No deformity. Normal range of motion.     Cervical back: Normal range of motion and neck supple.  Skin:    General: Skin is warm and dry.     Findings: No erythema or rash.  Neurological:     Mental Status: She is alert. Mental status is at baseline.     Cranial Nerves: No cranial nerve deficit.     Coordination: Coordination normal.     Comments: Orientated x 2 name and place.   Psychiatric:     Comments: Anxious    LABORATORY DATA:  I have reviewed the data as listed Lab Results  Component Value Date   WBC 7.1 11/07/2021   HGB 10.6 (L) 11/07/2021   HCT 33.6 (L) 11/07/2021   MCV 79.1 (L) 11/07/2021   PLT 264 11/07/2021   Recent Labs    10/09/21 0756 10/17/21 0801 11/07/21 0754  NA 133* 134* 134*  K 4.6 4.8 4.3  CL 101 99 97*  CO2 22 23 23   GLUCOSE 170* 112* 162*  BUN 21 23 21   CREATININE 1.21* 1.33* 1.01*  CALCIUM 8.8* 8.9 8.5*  GFRNONAA 46* 41* 58*  PROT 7.5 7.3 7.1  ALBUMIN 3.7 3.6 3.3*  AST 34 24 30  ALT 18 14 15   ALKPHOS 174* 148* 147*  BILITOT 0.6 0.6 0.5    Iron/TIBC/Ferritin/ %Sat    Component Value Date/Time   IRON 28 07/10/2021 1147   IRON 54 07/05/2014 0958   TIBC 249 (L) 07/10/2021 1147   TIBC 334 07/05/2014 0958   FERRITIN 180 07/10/2021 1147   FERRITIN 159 07/05/2014 0958   IRONPCTSAT 11 07/10/2021 1147   IRONPCTSAT 16 07/05/2014 0958       RADIOGRAPHIC STUDIES: I have personally reviewed the radiological images as listed and agreed with the findings in the report. No results found.    ASSESSMENT & PLAN:  1. Encounter for antineoplastic chemotherapy   2. Malignant neoplasm of uterus, unspecified site ([place])   3. Antineoplastic chemotherapy induced anemia   4. Elevated  serum creatinine   5. Agitation due to dementia    #Poorly differentiated neoplasm of uterus S/p 3 cycles of adjuvant carboplatin Taxol.  CT scan shows no metastatic disease Labs reviewed and discussed with patient.  Labs reviewed and discussed with  patient and facility staff.  Proceed with cycle 5 carboplatin Taxol with 100mg /m2  #Chemotherapy-induced neuropathy, mild.  Continue monitor. #Elevated creatinine, patient will be given 1 L of IV fluid normal saline.  Encourage oral hydration. #Tachycardia, resolved after 1L of IV fluid and her home dose of Ativan.  #Chemotherapy-induced anemia, hemoglobin has decreased to 10.6. Iron panel was added today which is not consistent with iron deficiency.  Will advise patient to stop oral iron supplementation.  #Dementia, hopefully patient will continue to cope with chemotherapy.  Managed by primary care provider, Ativan 0.5 mg PRN prior to the chemo if needed for agitation.   # Alkaline phosphate elevation chronic.   All questions were answered. The patient knows to call the clinic with any problems questions or concerns.  cc Dionicia Abler, NP  Return of visit:  3 weeks  for evaluation prior to next chemo  Earlie Server, MD, PhD 11/07/2021

## 2021-11-07 NOTE — Progress Notes (Signed)
Ok per MD to tx as HR is 89 after 1 L NS .

## 2021-11-08 ENCOUNTER — Telehealth: Payer: Self-pay

## 2021-11-08 NOTE — Telephone Encounter (Signed)
-----   Message from Earlie Server, MD sent at 11/07/2021  9:03 PM EST ----- Please advise patient to stop oral iron supplementation. Not iron deficient

## 2021-11-08 NOTE — Telephone Encounter (Signed)
Called patient to inform. Patient unable to understand me, but informed via Mychart as well.

## 2021-11-12 ENCOUNTER — Ambulatory Visit: Payer: Medicare (Managed Care) | Admitting: Pain Medicine

## 2021-11-28 ENCOUNTER — Inpatient Hospital Stay: Payer: Medicare (Managed Care) | Attending: Oncology

## 2021-11-28 ENCOUNTER — Inpatient Hospital Stay: Payer: Medicare (Managed Care)

## 2021-11-28 ENCOUNTER — Other Ambulatory Visit: Payer: Self-pay

## 2021-11-28 ENCOUNTER — Inpatient Hospital Stay (HOSPITAL_BASED_OUTPATIENT_CLINIC_OR_DEPARTMENT_OTHER): Payer: Medicare (Managed Care) | Admitting: Oncology

## 2021-11-28 ENCOUNTER — Encounter: Payer: Self-pay | Admitting: Oncology

## 2021-11-28 VITALS — BP 141/71 | HR 70 | Temp 97.4°F | Resp 18 | Ht 62.0 in | Wt 189.6 lb

## 2021-11-28 DIAGNOSIS — C55 Malignant neoplasm of uterus, part unspecified: Secondary | ICD-10-CM | POA: Insufficient documentation

## 2021-11-28 DIAGNOSIS — D6481 Anemia due to antineoplastic chemotherapy: Secondary | ICD-10-CM

## 2021-11-28 DIAGNOSIS — R7989 Other specified abnormal findings of blood chemistry: Secondary | ICD-10-CM | POA: Diagnosis not present

## 2021-11-28 DIAGNOSIS — T451X5A Adverse effect of antineoplastic and immunosuppressive drugs, initial encounter: Secondary | ICD-10-CM

## 2021-11-28 DIAGNOSIS — Z5111 Encounter for antineoplastic chemotherapy: Secondary | ICD-10-CM

## 2021-11-28 DIAGNOSIS — F03911 Unspecified dementia, unspecified severity, with agitation: Secondary | ICD-10-CM

## 2021-11-28 DIAGNOSIS — K521 Toxic gastroenteritis and colitis: Secondary | ICD-10-CM

## 2021-11-28 DIAGNOSIS — R110 Nausea: Secondary | ICD-10-CM

## 2021-11-28 LAB — CBC WITH DIFFERENTIAL/PLATELET
Abs Immature Granulocytes: 0 10*3/uL (ref 0.00–0.07)
Basophils Absolute: 0 10*3/uL (ref 0.0–0.1)
Basophils Relative: 1 %
Eosinophils Absolute: 0 10*3/uL (ref 0.0–0.5)
Eosinophils Relative: 1 %
HCT: 30 % — ABNORMAL LOW (ref 36.0–46.0)
Hemoglobin: 9.4 g/dL — ABNORMAL LOW (ref 12.0–15.0)
Immature Granulocytes: 0 %
Lymphocytes Relative: 40 %
Lymphs Abs: 1.7 10*3/uL (ref 0.7–4.0)
MCH: 26.3 pg (ref 26.0–34.0)
MCHC: 31.3 g/dL (ref 30.0–36.0)
MCV: 84 fL (ref 80.0–100.0)
Monocytes Absolute: 0.5 10*3/uL (ref 0.1–1.0)
Monocytes Relative: 13 %
Neutro Abs: 1.9 10*3/uL (ref 1.7–7.7)
Neutrophils Relative %: 45 %
Platelets: 174 10*3/uL (ref 150–400)
RBC: 3.57 MIL/uL — ABNORMAL LOW (ref 3.87–5.11)
RDW: 23.2 % — ABNORMAL HIGH (ref 11.5–15.5)
WBC: 4.1 10*3/uL (ref 4.0–10.5)
nRBC: 0 % (ref 0.0–0.2)

## 2021-11-28 LAB — COMPREHENSIVE METABOLIC PANEL
ALT: 14 U/L (ref 0–44)
AST: 26 U/L (ref 15–41)
Albumin: 3.5 g/dL (ref 3.5–5.0)
Alkaline Phosphatase: 137 U/L — ABNORMAL HIGH (ref 38–126)
Anion gap: 13 (ref 5–15)
BUN: 15 mg/dL (ref 8–23)
CO2: 22 mmol/L (ref 22–32)
Calcium: 8.6 mg/dL — ABNORMAL LOW (ref 8.9–10.3)
Chloride: 102 mmol/L (ref 98–111)
Creatinine, Ser: 1.08 mg/dL — ABNORMAL HIGH (ref 0.44–1.00)
GFR, Estimated: 53 mL/min — ABNORMAL LOW (ref >60)
Glucose, Bld: 107 mg/dL — ABNORMAL HIGH (ref 70–99)
Potassium: 4.3 mmol/L (ref 3.5–5.1)
Sodium: 137 mmol/L (ref 135–145)
Total Bilirubin: 0.6 mg/dL (ref 0.3–1.2)
Total Protein: 7 g/dL (ref 6.5–8.1)

## 2021-11-28 MED ORDER — SODIUM CHLORIDE 0.9 % IV SOLN
Freq: Once | INTRAVENOUS | Status: AC
  Filled 2021-11-28: qty 250

## 2021-11-28 MED ORDER — SODIUM CHLORIDE 0.9 % IV SOLN
460.0000 mg | Freq: Once | INTRAVENOUS | Status: AC
  Administered 2021-11-28: 460 mg via INTRAVENOUS
  Filled 2021-11-28: qty 46

## 2021-11-28 MED ORDER — SODIUM CHLORIDE 0.9 % IV SOLN
10.0000 mg | Freq: Once | INTRAVENOUS | Status: AC
  Administered 2021-11-28: 10 mg via INTRAVENOUS
  Filled 2021-11-28: qty 1

## 2021-11-28 MED ORDER — FAMOTIDINE 20 MG IN NS 100 ML IVPB
20.0000 mg | Freq: Once | INTRAVENOUS | Status: AC
  Administered 2021-11-28: 20 mg via INTRAVENOUS
  Filled 2021-11-28: qty 20

## 2021-11-28 MED ORDER — PALONOSETRON HCL INJECTION 0.25 MG/5ML
0.2500 mg | Freq: Once | INTRAVENOUS | Status: AC
  Administered 2021-11-28: 0.25 mg via INTRAVENOUS
  Filled 2021-11-28: qty 5

## 2021-11-28 MED ORDER — SODIUM CHLORIDE 0.9 % IV SOLN
150.0000 mg | Freq: Once | INTRAVENOUS | Status: AC
  Administered 2021-11-28: 150 mg via INTRAVENOUS
  Filled 2021-11-28: qty 150

## 2021-11-28 MED ORDER — DIPHENHYDRAMINE HCL 50 MG/ML IJ SOLN
50.0000 mg | Freq: Once | INTRAMUSCULAR | Status: AC
  Administered 2021-11-28: 50 mg via INTRAVENOUS
  Filled 2021-11-28: qty 1

## 2021-11-28 MED ORDER — LORAZEPAM 2 MG/ML IJ SOLN: 0.5000 mg | INTRAMUSCULAR | Status: DC | PRN

## 2021-11-28 MED ORDER — SODIUM CHLORIDE 0.9% FLUSH
10.0000 mL | INTRAVENOUS | Status: DC | PRN
  Filled 2021-11-28: qty 10

## 2021-11-28 MED ORDER — SODIUM CHLORIDE 0.9 % IV SOLN
100.0000 mg/m2 | Freq: Once | INTRAVENOUS | Status: AC
  Administered 2021-11-28: 198 mg via INTRAVENOUS
  Filled 2021-11-28: qty 33

## 2021-11-28 MED ORDER — SODIUM CHLORIDE 0.9 % IV SOLN
Freq: Once | INTRAVENOUS | Status: DC
  Filled 2021-11-28: qty 250

## 2021-11-28 NOTE — Progress Notes (Signed)
Pt here today with caregiver. No concerns voiced. Medication reconciliation done with medication bubble pack.

## 2021-11-28 NOTE — Progress Notes (Signed)
Hematology/Oncology progress note [phone] [phone]   Patient Care Team: Dionicia Abler, NP as PCP - General Clent Jacks, RN as Oncology Nurse Navigator Earlie Server, MD as Consulting Physician (Hematology and Oncology)  REFERRING PROVIDER: Dionicia Abler, NP  CHIEF COMPLAINTS/REASON FOR VISIT:  Follow-up for treatment of uterus neoplasm.  HISTORY OF PRESENTING ILLNESS:   Alexis Mclaughlin is a  75 y.o.  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  She was also noted to have lactic acidosis, leukocytosis, tachycardia. 06/01/2021, CT scan showed a markedly enlarged uterus with extensive fluid and air, consistent with intrauterine abscess. Blood cultures obtained on admission grew Clostridium perfringens.  She was treated with IV antibiotics, she passed a large amount of tissue vaginally on 06/02/2021-pathology showed necrotic poorly differentiated neoplasm.  She was transferred to [facility] for further management.  Patient was seen by gynecology oncology. 06/05/2021, status post robotic assisted laparoscopic total hysterectomy with bilateral salpingo-oophorectomy, excision of posterior cervix and upper vaginal, cystoscopy, D&C. Findings:  On EUA, enlarged 18cm bulbous uterus, minimally mobile. ON speculum exam, enlarged cervix almost entirely necrotic with necrotic tissue prolapsing through dilated cervical os. Necrotic tissue on D&C, no significant return of tissue and gritty texture felt almost immediately. Cervix approximately 6cm. On intra-abdominal entry, normal upper abdominal survey including liver edge, stomach, and omentum. Normal appearing small and large bowel. Uterus 18cm, bulbous at fundus. Atrophic appearing adnexa. No breach of uterine capsule by tumor. Retroperitoneum  significantly fibrotic and edematous, no obvious lymphadenopathy. After delivery of uterine specimen, significantly necrotic tissue noted along posterior colpotomy, likely with residual cervix in situ. After rectovaginal space created, additional posterior cervix excised down to level of healthy vagina. On cystoscopy, bladder dome intact, good efflux noted from bilateral ureteral orifices.   Pathology showed poorly differentiated malignant neoplasm with extensive necrosis and associated with superior of inflammation.  Tumor invades deep myometrium but does not involve serosal surface.  Cervix with separative inflammation and necrosis-no definitive evidence of tumor.  Right fallopian tube with suppurative inflammation and necrosis.  Benign unremarkable left fallopian tube and bilateral ovaries. Postop, patient gradually improves and was discharged to SNF on 06/08/2021..  06/20/2021, patient was seen by Dr. Berline Lopes for postop follow-up.  She appears physically recovering from her surgery very well. Patient's case was presented to GYN oncology tumor conference by Dr. Berline Lopes. 07/02/2021, repeat CT chest abdomen pelvis showed prominent low retroperitoneal, iliac, pelvic sidewall lymph nodes are unchanged.,  Nonspecific and modestly suspicious for nodal metastasis.  Attention on follow-up.  No noncontrast radiographic evidence of distant metastasis disease in the chest, abdomen or pelvis.  07/03/2021.  Follow-up visit with Dr. Berline Lopes Given that patient's mobility has improved and recovered well from her surgery.  Dr. Berline Lopes recommends adjuvant chemotherapy most likely carboplatin alone or dose reduced carboplatin and paclitaxel.  Since patient lives closer to [facility], patient was referred to establish care with [facility] cancer center for further management.  Patient has baseline dementia, she goes to senior care facility [facility].   10/05/2021 CT chest abdomen pelvis shows no recurrence or metastatic  disease.  No evidence of pelvic sidewall nodularity or lymphadenopathy.  Incidental findings of cholelithiasis and benign left renal cyst. I discussed CT findings with patient's gynecology oncologist Dr. Berline Lopes and she recommends to continue another 3 cycles of adjuvant chemotherapy  if the patient is able to tolerate.  INTERVAL HISTORY [name] is a 75 y.o. female who has above history reviewed by me today presents for follow up visit for evaluation prior to adjuvant chemotherapy. Patient was accompanied by staff from [facility] program.  Patient is a poor historian.  Most history obtained from staff. Stable neuropathy symptoms.  She has 3 episodes of feeling nausea during this interval. She took antiemetics which improved her symptoms. Also occational loose bowel movements, she took anti diarrhea med with improvement. No diarrhea today.  No fever or chills. Appetite was fair recently. She has lost 5 pounds since last visit.   Review of Systems  Unable to perform ROS: Dementia  Constitutional:  Negative for fever.  Genitourinary:  Negative for vaginal bleeding and vaginal discharge.    MEDICAL HISTORY:  Past Medical History:  Diagnosis Date   Arthritis    Constipation    Dementia ([place])    Diabetes mellitus without complication ([place])    GERD (gastroesophageal reflux disease)    Hypertension    Hypothyroidism     SURGICAL HISTORY: Past Surgical History:  Procedure Laterality Date   BREAST BIOPSY Right 09/02/2018   affirm stereo/path pending   CYSTOSCOPY  06/05/2021   Procedure: CYSTOSCOPY;  Surgeon: [name], MD;  Location: [facility] ORS;  Service: Gynecology;;   DILATION AND CURETTAGE OF UTERUS N/A 06/05/2021   Procedure: DILATATION AND CURETTAGE UNDER LAPAROSCOPIC GUIDANCE;  Surgeon: [name], MD;  Location: [facility]S;  Service: Gynecology;  Laterality: N/A;   LAPAROSCOPY N/A 06/05/2021   Procedure: LAPAROSCOPY OPERATIVE;  Surgeon: [name], MD;  Location: [facility]  ORS;  Service: Gynecology;  Laterality: N/A;   ROBOTIC ASSISTED TOTAL HYSTERECTOMY WITH BILATERAL SALPINGO OOPHERECTOMY N/A 06/05/2021   Procedure: XI ROBOTIC ASSISTED TOTAL HYSTERECTOMY WITH BILATERAL SALPINGO OOPHORECTOMY;  Surgeon: [name], MD;  Location: [facility] ORS;  Service: Gynecology;  Laterality: N/A;   TUBAL LIGATION      SOCIAL HISTORY: Social History   Socioeconomic History   Marital status: Married    Spouse name: Not on file   Number of children: Not on file   Years of education: Not on file   Highest education level: Not on file  Occupational History   Not on file  Tobacco Use   Smoking status: Former    Types: Cigarettes   Smokeless tobacco: Never   Tobacco comments:    Quit at least 30 years ago (prior to 1990)  Vaping Use   Vaping Use: Never used  Substance and Sexual Activity   Alcohol use: Not Currently   Drug use: Never   Sexual activity: Not Currently  Other Topics Concern   Not on file  Social History Narrative   Not on file   Social Determinants of Health   Financial Resource Strain: Not on file  Food Insecurity: Not on file  Transportation Needs: Not on file  Physical Activity: Not on file  Stress: Not on file  Social Connections: Not on file  Intimate Partner Violence: Not on file    FAMILY HISTORY: Family History  Problem Relation Age of Onset   CAD Mother    CAD Father    Cancer Neg Hx     ALLERGIES:  is allergic to ace inhibitors.  MEDICATIONS:  Current Outpatient Medications  Medication Sig Dispense Refill   acetaminophen (TYLENOL) 650 MG CR tablet SMARTSIG:1 By Mouth Daily PRN     aspirin EC 81 MG tablet Take 81 mg  by mouth daily. Swallow whole.     busPIRone (BUSPAR) 5 MG tablet Take 5 mg by mouth in the morning and at bedtime.     cholecalciferol (VITAMIN D3) 25 MCG (1000 UNIT) tablet Take 1,000 Units by mouth daily.     docusate sodium (COLACE) 100 MG capsule Take 100 mg by mouth daily.     DULoxetine (CYMBALTA) 60  MG capsule Take 60 mg by mouth daily.     Ensure (ENSURE) Take by mouth.     ferrous sulfate 325 (65 FE) MG EC tablet Take 1 tablet (325 mg total) by mouth daily. 30 tablet 1   furosemide (LASIX) 20 MG tablet Take 20 mg by mouth.     levothyroxine (SYNTHROID) 50 MCG tablet Take 50 mcg by mouth daily before breakfast.     LIPITOR 80 MG tablet Take 1 tablet by mouth daily.     metFORMIN (GLUCOPHAGE-XR) 500 MG 24 hr tablet Take 500 mg by mouth 2 (two) times daily.     omeprazole (PRILOSEC) 20 MG capsule Take 20 mg by mouth daily.     potassium chloride SA (KLOR-CON) 20 MEQ tablet Take 20 mEq by mouth daily.     amLODipine (NORVASC) 10 MG tablet Take 10 mg by mouth daily. (Patient not taking: Reported on 10/17/2021)     ANTI-ITCH lotion Apply topically. SARNA LOTION     diclofenac Sodium (VOLTAREN) 1 % GEL SMARTSIG:Gram(s) Topical Twice Daily     DULoxetine (CYMBALTA) 30 MG capsule Take 30 mg by mouth daily.     fluticasone (FLONASE) 50 MCG/ACT nasal spray Place 2 sprays into both nostrils daily.     gabapentin (NEURONTIN) 100 MG capsule Take 1 capsule (100 mg total) by mouth at bedtime for 30 days, THEN 2 capsules (200 mg total) at bedtime for 30 days, THEN 3 capsules (300 mg total) at bedtime. Follow written titration schedule.. 180 capsule 0   Insulin Glargine (BASAGLAR KWIKPEN) 100 UNIT/ML Inject 14 Units into the skin daily. (Patient not taking: Reported on 10/17/2021)     lidocaine (LIDODERM) 5 % Place 2 patches onto the skin daily as needed (pain). Remove & Discard patch within 12 hours or as directed by MD     LORazepam (ATIVAN) 0.5 MG tablet SMARTSIG:0.5 Milligram(s) By Mouth Every Morning     metoprolol succinate (TOPROL-XL) 25 MG 24 hr tablet Take 1 tablet (25 mg total) by mouth daily. 30 tablet 1   miconazole (MICOTIN) 2 % cream Apply 1 application topically at bedtime.     nystatin (MYCOSTATIN/NYSTOP) powder Apply 1 application topically 2 (two) times daily.     ondansetron (ZOFRAN) 8  MG tablet Take 1 tablet (8 mg total) by mouth 2 (two) times daily as needed. Start on the third day after chemotherapy. 30 tablet 1   polyethylene glycol (MIRALAX / GLYCOLAX) 17 g packet Take 17 g by mouth 2 (two) times daily. 14 each 0   prochlorperazine (COMPAZINE) 10 MG tablet Take 1 tablet (10 mg total) by mouth every 6 (six) hours as needed (Nausea or vomiting). 30 tablet 1   sodium chloride (OCEAN) 0.65 % nasal spray Place 1 spray into the nose 2 (two) times daily as needed for congestion. (Patient not taking: Reported on 10/17/2021)     traMADol (ULTRAM) 50 MG tablet Take by mouth.     traZODone (DESYREL) 50 MG tablet Take 25 mg by mouth at bedtime.     trolamine salicylate (ASPERCREME/ALOE) 10 % cream Apply 1 application topically  every 6 (six) hours as needed for muscle pain.     Witch Hazel (EQ HYGIENIC CLEANSING WIPES) PADS Apply 1 application topically 2 (two) times daily. (Patient not taking: Reported on 10/17/2021)     No current facility-administered medications for this visit.     PHYSICAL EXAMINATION: ECOG PERFORMANCE STATUS: 2 - Symptomatic, <50% confined to bed Vitals:   11/28/21 0846  BP: (!) 141/71  Pulse: 70  Resp: 18  Temp: (!) 97.4 F (36.3 C)  SpO2: 97%   Filed Weights   11/28/21 0846  Weight: 189 lb 9.6 oz (86 kg)       Physical Exam Constitutional:      General: She is not in acute distress.    Appearance: She is obese.     Comments: Patient sits in a wheelchair today.  HENT:     Head: Normocephalic and atraumatic.  Eyes:     General: No scleral icterus. Cardiovascular:     Rate and Rhythm: Normal rate and regular rhythm.     Heart sounds: Normal heart sounds.  Pulmonary:     Effort: Pulmonary effort is normal. No respiratory distress.     Breath sounds: No wheezing.  Abdominal:     General: Bowel sounds are normal. There is no distension.     Palpations: Abdomen is soft.  Musculoskeletal:        General: No deformity. Normal range of  motion.     Cervical back: Normal range of motion and neck supple.  Skin:    General: Skin is warm and dry.     Findings: No erythema or rash.  Neurological:     Mental Status: She is alert. Mental status is at baseline.     Cranial Nerves: No cranial nerve deficit.     Coordination: Coordination normal.     Comments: Orientated x 2 name and place.   Psychiatric:        Mood and Affect: Mood normal.    LABORATORY DATA:  I have reviewed the data as listed Lab Results  Component Value Date   WBC 4.1 11/28/2021   HGB 9.4 (L) 11/28/2021   HCT 30.0 (L) 11/28/2021   MCV 84.0 11/28/2021   PLT 174 11/28/2021   Recent Labs    10/17/21 0801 11/07/21 0754 11/28/21 0754  NA 134* 134* 137  K 4.8 4.3 4.3  CL 99 97* 102  CO2 23 23 22   GLUCOSE 112* 162* 107*  BUN 23 21 15   CREATININE 1.33* 1.01* 1.08*  CALCIUM 8.9 8.5* 8.6*  GFRNONAA 41* 58* 53*  PROT 7.3 7.1 7.0  ALBUMIN 3.6 3.3* 3.5  AST 24 30 26   ALT 14 15 14   ALKPHOS 148* 147* 137*  BILITOT 0.6 0.5 0.6    Iron/TIBC/Ferritin/ %Sat    Component Value Date/Time   IRON 71 11/07/2021 0754   IRON 54 07/05/2014 0958   TIBC 192 (L) 11/07/2021 0754   TIBC 334 07/05/2014 0958   FERRITIN 845 (H) 11/07/2021 0754   FERRITIN 159 07/05/2014 0958   IRONPCTSAT 37 (H) 11/07/2021 0754   IRONPCTSAT 16 07/05/2014 0958       RADIOGRAPHIC STUDIES: I have personally reviewed the radiological images as listed and agreed with the findings in the report. No results found.    ASSESSMENT & PLAN:  1. Encounter for antineoplastic chemotherapy   2. Malignant neoplasm of uterus, unspecified site ([place])   3. Antineoplastic chemotherapy induced anemia   4. Elevated serum creatinine   5. Agitation  due to dementia   6. Chemotherapy-induced nausea   7. Chemotherapy induced diarrhea    #Poorly differentiated neoplasm of uterus S/p 5 cycles of adjuvant carboplatin Taxol.  Labs are reviewed and discussed with patient.  Proceed with cycle 6  carboplatin dose reduced Taxol 100mg /m2  #Chemotherapy-induced neuropathy, mild.  Continue monitor. #Elevated creatinine, stable  Encourage oral hydration.she is followed by her PCP at the facility for PRN IV hydration.   # Chemotherapy induced nausea and diarrhea.  Stable symptoms. Controlled by antiemetics and anti diarrhea meds.   #Chemotherapy-induced anemia, hemoglobin has decreased to 9.4. Iron panel is not consistent with iron deficiency. stop oral iron supplementation. Repeat cbc in 10 days.   #Dementia, hopefully patient will continue to cope with chemotherapy.  Managed by primary care provider, Ativan 0.5 mg PRN prior to the chemo if needed for agitation.   # Alkaline phosphate elevation chronic.   All questions were answered. The patient knows to call the clinic with any problems questions or concerns.  cc Dionicia Abler, NP  Return of visit:   January 2022  Earlie Server, MD, PhD 11/28/2021

## 2021-11-28 NOTE — Patient Instructions (Signed)
[facility]  Discharge Instructions: °Thank you for choosing [facility] to provide your oncology and hematology care.  °If you have a lab appointment with the Cancer Center, please go directly to the Cancer Center and check in at the registration area. ° °Wear comfortable clothing and clothing appropriate for easy access to any Portacath or PICC line.  ° °We strive to give you quality time with your provider. You may need to reschedule your appointment if you arrive late (15 or more minutes).  Arriving late affects you and other patients whose appointments are after yours.  Also, if you miss three or more appointments without notifying the office, you may be dismissed from the clinic at the provider’s discretion.    °  °For prescription refill requests, have your pharmacy contact our office and allow 72 hours for refills to be completed.   ° °Today you received the following chemotherapy and/or immunotherapy agents - paclitaxel, carboplatin    °  °To help prevent nausea and vomiting after your treatment, we encourage you to take your nausea medication as directed. ° °BELOW ARE SYMPTOMS THAT SHOULD BE REPORTED IMMEDIATELY: °*FEVER GREATER THAN 100.4 F (38 °C) OR HIGHER °*CHILLS OR SWEATING °*NAUSEA AND VOMITING THAT IS NOT CONTROLLED WITH YOUR NAUSEA MEDICATION °*UNUSUAL SHORTNESS OF BREATH °*UNUSUAL BRUISING OR BLEEDING °*URINARY PROBLEMS (pain or burning when urinating, or frequent urination) °*BOWEL PROBLEMS (unusual diarrhea, constipation, pain near the anus) °TENDERNESS IN MOUTH AND THROAT WITH OR WITHOUT PRESENCE OF ULCERS (sore throat, sores in mouth, or a toothache) °UNUSUAL RASH, SWELLING OR PAIN  °UNUSUAL VAGINAL DISCHARGE OR ITCHING  ° °Items with * indicate a potential emergency and should be followed up as soon as possible or go to the Emergency Department if any problems should occur. ° °Please show the CHEMOTHERAPY ALERT CARD or IMMUNOTHERAPY ALERT CARD  at check-in to the Emergency Department and triage nurse. ° °Should you have questions after your visit or need to cancel or reschedule your appointment, please contact [facility]  [phone] and follow the prompts.  Office hours are 8:00 a.m. to 4:30 p.m. Monday - Friday. Please note that voicemails left after 4:00 p.m. may not be returned until the following business day.  We are closed weekends and major holidays. You have access to a nurse at all times for urgent questions. Please call the main number to the clinic [phone] and follow the prompts. ° °For any non-urgent questions, you may also contact your provider using MyChart. We now offer e-Visits for anyone 18 and older to request care online for non-urgent symptoms. For details visit [email or web address]. °  °Also download the MyChart app! Go to the app store, search "MyChart", open the app, select [facility], and log in with your MyChart username and password. ° °Due to Covid, a mask is required upon entering the hospital/clinic. If you do not have a mask, one will be given to you upon arrival. For doctor visits, patients may have 1 support person aged 18 or older with them. For treatment visits, patients cannot have anyone with them due to current Covid guidelines and our immunocompromised population.  ° °Paclitaxel injection °What is this medication? °PACLITAXEL (PAK li TAX el) is a chemotherapy drug. It targets fast dividing cells, like cancer cells, and causes these cells to die. This medicine is used to treat ovarian cancer, breast cancer, lung cancer, Kaposi's sarcoma, and other cancers. °This medicine may be used for other   purposes; ask your health care provider or pharmacist if you have questions. °COMMON BRAND NAME(S): Onxol, Taxol °What should I tell my care team before I take this medication? °They need to know if you have any of these conditions: °history of irregular heartbeat °liver disease °low  blood counts, like low white cell, platelet, or red cell counts °lung or breathing disease, like asthma °tingling of the fingers or toes, or other nerve disorder °an unusual or allergic reaction to paclitaxel, alcohol, polyoxyethylated castor oil, other chemotherapy, other medicines, foods, dyes, or preservatives °pregnant or trying to get pregnant °breast-feeding °How should I use this medication? °This drug is given as an infusion into a vein. It is administered in a hospital or clinic by a specially trained health care professional. °Talk to your pediatrician regarding the use of this medicine in children. Special care may be needed. °Overdosage: If you think you have taken too much of this medicine contact a poison control center or emergency room at once. °NOTE: This medicine is only for you. Do not share this medicine with others. °What if I miss a dose? °It is important not to miss your dose. Call your doctor or health care professional if you are unable to keep an appointment. °What may interact with this medication? °Do not take this medicine with any of the following medications: °live virus vaccines °This medicine may also interact with the following medications: °antiviral medicines for hepatitis, HIV or AIDS °certain antibiotics like erythromycin and clarithromycin °certain medicines for fungal infections like ketoconazole and itraconazole °certain medicines for seizures like carbamazepine, phenobarbital, phenytoin °gemfibrozil °nefazodone °rifampin °St. John's wort °This list may not describe all possible interactions. Give your health care provider a list of all the medicines, herbs, non-prescription drugs, or dietary supplements you use. Also tell them if you smoke, drink alcohol, or use illegal drugs. Some items may interact with your medicine. °What should I watch for while using this medication? °Your condition will be monitored carefully while you are receiving this medicine. You will need  important blood work done while you are taking this medicine. °This medicine can cause serious allergic reactions. To reduce your risk you will need to take other medicine(s) before treatment with this medicine. If you experience allergic reactions like skin rash, itching or hives, swelling of the face, lips, or tongue, tell your doctor or health care professional right away. °In some cases, you may be given additional medicines to help with side effects. Follow all directions for their use. °This drug may make you feel generally unwell. This is not uncommon, as chemotherapy can affect healthy cells as well as cancer cells. Report any side effects. Continue your course of treatment even though you feel ill unless your doctor tells you to stop. °Call your doctor or health care professional for advice if you get a fever, chills or sore throat, or other symptoms of a cold or flu. Do not treat yourself. This drug decreases your body's ability to fight infections. Try to avoid being around people who are sick. °This medicine may increase your risk to bruise or bleed. Call your doctor or health care professional if you notice any unusual bleeding. °Be careful brushing and flossing your teeth or using a toothpick because you may get an infection or bleed more easily. If you have any dental work done, tell your dentist you are receiving this medicine. °Avoid taking products that contain aspirin, acetaminophen, ibuprofen, naproxen, or ketoprofen unless instructed by your doctor. These medicines may   hide a fever. °Do not become pregnant while taking this medicine. Women should inform their doctor if they wish to become pregnant or think they might be pregnant. There is a potential for serious side effects to an unborn child. Talk to your health care professional or pharmacist for more information. Do not breast-feed an infant while taking this medicine. °Men are advised not to father a child while receiving this  medicine. °This product may contain alcohol. Ask your pharmacist or healthcare provider if this medicine contains alcohol. Be sure to tell all healthcare providers you are taking this medicine. Certain medicines, like metronidazole and disulfiram, can cause an unpleasant reaction when taken with alcohol. The reaction includes flushing, headache, nausea, vomiting, sweating, and increased thirst. The reaction can last from 30 minutes to several hours. °What side effects may I notice from receiving this medication? °Side effects that you should report to your doctor or health care professional as soon as possible: °allergic reactions like skin rash, itching or hives, swelling of the face, lips, or tongue °breathing problems °changes in vision °fast, irregular heartbeat °high or low blood pressure °mouth sores °pain, tingling, numbness in the hands or feet °signs of decreased platelets or bleeding - bruising, pinpoint red spots on the skin, black, tarry stools, blood in the urine °signs of decreased red blood cells - unusually weak or tired, feeling faint or lightheaded, falls °signs of infection - fever or chills, cough, sore throat, pain or difficulty passing urine °signs and symptoms of liver injury like dark yellow or brown urine; general ill feeling or flu-like symptoms; light-colored stools; loss of appetite; nausea; right upper belly pain; unusually weak or tired; yellowing of the eyes or skin °swelling of the ankles, feet, hands °unusually slow heartbeat °Side effects that usually do not require medical attention (report to your doctor or health care professional if they continue or are bothersome): °diarrhea °hair loss °loss of appetite °muscle or joint pain °nausea, vomiting °pain, redness, or irritation at site where injected °tiredness °This list may not describe all possible side effects. Call your doctor for medical advice about side effects. You may report side effects to FDA at 1-800-FDA-1088. °Where  should I keep my medication? °This drug is given in a hospital or clinic and will not be stored at home. °NOTE: This sheet is a summary. It may not cover all possible information. If you have questions about this medicine, talk to your doctor, pharmacist, or health care provider. °© 2022 [employer] (2021-08-28 00:00:00) ° °Carboplatin injection °What is this medication? °CARBOPLATIN (KAR boe pla tin) is a chemotherapy drug. It targets fast dividing cells, like cancer cells, and causes these cells to die. This medicine is used to treat ovarian cancer and many other cancers. °This medicine may be used for other purposes; ask your health care provider or pharmacist if you have questions. °COMMON BRAND NAME(S): Paraplatin °What should I tell my care team before I take this medication? °They need to know if you have any of these conditions: °blood disorders °hearing problems °kidney disease °recent or ongoing radiation therapy °an unusual or allergic reaction to carboplatin, cisplatin, other chemotherapy, other medicines, foods, dyes, or preservatives °pregnant or trying to get pregnant °breast-feeding °How should I use this medication? °This drug is usually given as an infusion into a vein. It is administered in a hospital or clinic by a specially trained health care professional. °Talk to your pediatrician regarding the use of this medicine in children. Special care may be   needed. °Overdosage: If you think you have taken too much of this medicine contact a poison control center or emergency room at once. °NOTE: This medicine is only for you. Do not share this medicine with others. °What if I miss a dose? °It is important not to miss a dose. Call your doctor or health care professional if you are unable to keep an appointment. °What may interact with this medication? °medicines for seizures °medicines to increase blood counts like filgrastim, pegfilgrastim, sargramostim °some antibiotics like amikacin,  gentamicin, neomycin, streptomycin, tobramycin °vaccines °Talk to your doctor or health care professional before taking any of these medicines: °acetaminophen °aspirin °ibuprofen °ketoprofen °naproxen °This list may not describe all possible interactions. Give your health care provider a list of all the medicines, herbs, non-prescription drugs, or dietary supplements you use. Also tell them if you smoke, drink alcohol, or use illegal drugs. Some items may interact with your medicine. °What should I watch for while using this medication? °Your condition will be monitored carefully while you are receiving this medicine. You will need important blood work done while you are taking this medicine. °This drug may make you feel generally unwell. This is not uncommon, as chemotherapy can affect healthy cells as well as cancer cells. Report any side effects. Continue your course of treatment even though you feel ill unless your doctor tells you to stop. °In some cases, you may be given additional medicines to help with side effects. Follow all directions for their use. °Call your doctor or health care professional for advice if you get a fever, chills or sore throat, or other symptoms of a cold or flu. Do not treat yourself. This drug decreases your body's ability to fight infections. Try to avoid being around people who are sick. °This medicine may increase your risk to bruise or bleed. Call your doctor or health care professional if you notice any unusual bleeding. °Be careful brushing and flossing your teeth or using a toothpick because you may get an infection or bleed more easily. If you have any dental work done, tell your dentist you are receiving this medicine. °Avoid taking products that contain aspirin, acetaminophen, ibuprofen, naproxen, or ketoprofen unless instructed by your doctor. These medicines may hide a fever. °Do not become pregnant while taking this medicine. Women should inform their doctor if they wish  to become pregnant or think they might be pregnant. There is a potential for serious side effects to an unborn child. Talk to your health care professional or pharmacist for more information. Do not breast-feed an infant while taking this medicine. °What side effects may I notice from receiving this medication? °Side effects that you should report to your doctor or health care professional as soon as possible: °allergic reactions like skin rash, itching or hives, swelling of the face, lips, or tongue °signs of infection - fever or chills, cough, sore throat, pain or difficulty passing urine °signs of decreased platelets or bleeding - bruising, pinpoint red spots on the skin, black, tarry stools, nosebleeds °signs of decreased red blood cells - unusually weak or tired, fainting spells, lightheadedness °breathing problems °changes in hearing °changes in vision °chest pain °high blood pressure °low blood counts - This drug may decrease the number of white blood cells, red blood cells and platelets. You may be at increased risk for infections and bleeding. °nausea and vomiting °pain, swelling, redness or irritation at the injection site °pain, tingling, numbness in the hands or feet °problems with balance, talking, walking °  trouble passing urine or change in the amount of urine °Side effects that usually do not require medical attention (report to your doctor or health care professional if they continue or are bothersome): °hair loss °loss of appetite °metallic taste in the mouth or changes in taste °This list may not describe all possible side effects. Call your doctor for medical advice about side effects. You may report side effects to FDA at 1-800-FDA-1088. °Where should I keep my medication? °This drug is given in a hospital or clinic and will not be stored at home. °NOTE: This sheet is a summary. It may not cover all possible information. If you have questions about this medicine, talk to your doctor, pharmacist,  or health care provider. °© 2022 [employer]/Gold Standard (2008-05-18 00:00:00) ° ° °

## 2021-12-06 ENCOUNTER — Telehealth: Payer: Self-pay | Admitting: Oncology

## 2021-12-06 NOTE — Telephone Encounter (Signed)
Caregiver called and states pt labs will be drawn at [facility]. So we need to her lab appt. Call back at [phone].

## 2021-12-07 ENCOUNTER — Inpatient Hospital Stay: Payer: Medicare (Managed Care)

## 2021-12-10 ENCOUNTER — Other Ambulatory Visit: Payer: Self-pay | Admitting: Oncology

## 2021-12-10 ENCOUNTER — Other Ambulatory Visit: Payer: Self-pay

## 2021-12-10 ENCOUNTER — Encounter: Payer: Self-pay | Admitting: Oncology

## 2021-12-10 ENCOUNTER — Telehealth: Payer: Self-pay | Admitting: *Deleted

## 2021-12-10 DIAGNOSIS — C55 Malignant neoplasm of uterus, part unspecified: Secondary | ICD-10-CM

## 2021-12-10 DIAGNOSIS — D702 Other drug-induced agranulocytosis: Secondary | ICD-10-CM

## 2021-12-10 DIAGNOSIS — D709 Neutropenia, unspecified: Secondary | ICD-10-CM | POA: Insufficient documentation

## 2021-12-10 DIAGNOSIS — Z5111 Encounter for antineoplastic chemotherapy: Secondary | ICD-10-CM

## 2021-12-10 HISTORY — DX: Neutropenia, unspecified: D70.9

## 2021-12-10 NOTE — Telephone Encounter (Signed)
Pt scheduled for repeat labs and possible transfuision (cbc, hold tube) tomorrow. Pt also scheduled for granix 300mg  daily x3 days. Spoke to [place], NP and notifed her of this and she approved. Notified her that appts for injections will be printed and sent home with pt tomorrow.  Please add granix to tomorrow's appt and schedule granix on 12/21 & 12/22 as well. We can have nurse print AVS with appts tomorrow before she leaves.

## 2021-12-10 NOTE — Telephone Encounter (Signed)
Incoming call from Dionicia Abler, NP with [facility] regarding labs drawn on this patient showing a HGB of 7.7, HCT 25.4, WBC 2.8. She is going to fax results over and is asking for a return call to let her know if we are going to transfuse patient. Patient has had weakness and confusion, but she has also had a UTI for which she received IM antibiotics.

## 2021-12-10 NOTE — Telephone Encounter (Signed)
Left VM for Alexis Mclaughlin, to update her on GCSF injection from granix to [place].   Aby please move inj from 12/23 to 12/22

## 2021-12-11 ENCOUNTER — Inpatient Hospital Stay: Payer: Medicare (Managed Care)

## 2021-12-11 ENCOUNTER — Other Ambulatory Visit: Payer: Self-pay

## 2021-12-11 DIAGNOSIS — Z5111 Encounter for antineoplastic chemotherapy: Secondary | ICD-10-CM | POA: Diagnosis not present

## 2021-12-11 DIAGNOSIS — C55 Malignant neoplasm of uterus, part unspecified: Secondary | ICD-10-CM

## 2021-12-11 LAB — CBC WITH DIFFERENTIAL/PLATELET
Abs Immature Granulocytes: 0.06 10*3/uL (ref 0.00–0.07)
Basophils Absolute: 0 10*3/uL (ref 0.0–0.1)
Basophils Relative: 0 %
Eosinophils Absolute: 0 10*3/uL (ref 0.0–0.5)
Eosinophils Relative: 0 %
HCT: 26.4 % — ABNORMAL LOW (ref 36.0–46.0)
Hemoglobin: 8.2 g/dL — ABNORMAL LOW (ref 12.0–15.0)
Immature Granulocytes: 1 %
Lymphocytes Relative: 40 %
Lymphs Abs: 2.8 10*3/uL (ref 0.7–4.0)
MCH: 27.3 pg (ref 26.0–34.0)
MCHC: 31.1 g/dL (ref 30.0–36.0)
MCV: 88 fL (ref 80.0–100.0)
Monocytes Absolute: 0.9 10*3/uL (ref 0.1–1.0)
Monocytes Relative: 12 %
Neutro Abs: 3.2 10*3/uL (ref 1.7–7.7)
Neutrophils Relative %: 47 %
Platelets: 91 10*3/uL — ABNORMAL LOW (ref 150–400)
RBC: 3 MIL/uL — ABNORMAL LOW (ref 3.87–5.11)
RDW: 21.8 % — ABNORMAL HIGH (ref 11.5–15.5)
WBC: 6.9 10*3/uL (ref 4.0–10.5)
nRBC: 0.3 % — ABNORMAL HIGH (ref 0.0–0.2)

## 2021-12-11 NOTE — Progress Notes (Signed)
Per Benjamine Mola RN per Dr. Tasia Catchings 12/11/21 labs reviewed (HGB 8.2, PLTs 91, WBC 6.9, ANC 3.2) no blood transfusion or Zarxio injection needed at this time. Pt made aware, pt stable at time of discharge.

## 2021-12-11 NOTE — Telephone Encounter (Signed)
Pt has repeat labwork today with results Hemoglobin 8.2, WBC 6.9, platelets 91, ANC 3.2. NO blood or zarxio today. Will cancel injections. [place] notified,

## 2021-12-12 ENCOUNTER — Inpatient Hospital Stay: Payer: Medicare (Managed Care)

## 2021-12-13 ENCOUNTER — Ambulatory Visit: Payer: Medicare (Managed Care)

## 2021-12-14 ENCOUNTER — Inpatient Hospital Stay: Payer: Medicare (Managed Care)

## 2021-12-27 ENCOUNTER — Telehealth: Payer: Self-pay | Admitting: Oncology

## 2021-12-27 NOTE — Telephone Encounter (Signed)
Per Dr. Tasia Catchings, ok to r/s to next available date.

## 2021-12-27 NOTE — Telephone Encounter (Signed)
Caregiver called to reschedule pt's appt for 1-11. Call back at [phone]

## 2022-01-02 ENCOUNTER — Other Ambulatory Visit: Payer: Medicare (Managed Care)

## 2022-01-02 ENCOUNTER — Ambulatory Visit: Payer: Medicare (Managed Care) | Admitting: Oncology

## 2022-01-28 ENCOUNTER — Inpatient Hospital Stay (HOSPITAL_BASED_OUTPATIENT_CLINIC_OR_DEPARTMENT_OTHER): Payer: Medicare (Managed Care) | Admitting: Oncology

## 2022-01-28 ENCOUNTER — Inpatient Hospital Stay: Payer: Medicare (Managed Care) | Attending: Oncology

## 2022-01-28 ENCOUNTER — Encounter: Payer: Self-pay | Admitting: Oncology

## 2022-01-28 ENCOUNTER — Other Ambulatory Visit: Payer: Self-pay

## 2022-01-28 VITALS — BP 106/72 | HR 92 | Temp 98.7°F | Resp 20 | Wt 176.0 lb

## 2022-01-28 DIAGNOSIS — D649 Anemia, unspecified: Secondary | ICD-10-CM | POA: Insufficient documentation

## 2022-01-28 DIAGNOSIS — E8809 Other disorders of plasma-protein metabolism, not elsewhere classified: Secondary | ICD-10-CM | POA: Diagnosis not present

## 2022-01-28 DIAGNOSIS — Z79899 Other long term (current) drug therapy: Secondary | ICD-10-CM | POA: Insufficient documentation

## 2022-01-28 DIAGNOSIS — F0390 Unspecified dementia without behavioral disturbance: Secondary | ICD-10-CM | POA: Insufficient documentation

## 2022-01-28 DIAGNOSIS — E876 Hypokalemia: Secondary | ICD-10-CM

## 2022-01-28 DIAGNOSIS — C55 Malignant neoplasm of uterus, part unspecified: Secondary | ICD-10-CM | POA: Diagnosis present

## 2022-01-28 DIAGNOSIS — E46 Unspecified protein-calorie malnutrition: Secondary | ICD-10-CM

## 2022-01-28 DIAGNOSIS — Z993 Dependence on wheelchair: Secondary | ICD-10-CM | POA: Diagnosis not present

## 2022-01-28 LAB — CBC WITH DIFFERENTIAL/PLATELET
Abs Immature Granulocytes: 0.03 K/uL (ref 0.00–0.07)
Basophils Absolute: 0 K/uL (ref 0.0–0.1)
Basophils Relative: 0 %
Eosinophils Absolute: 0.2 K/uL (ref 0.0–0.5)
Eosinophils Relative: 2 %
HCT: 34 % — ABNORMAL LOW (ref 36.0–46.0)
Hemoglobin: 10.4 g/dL — ABNORMAL LOW (ref 12.0–15.0)
Immature Granulocytes: 0 %
Lymphocytes Relative: 32 %
Lymphs Abs: 2.4 K/uL (ref 0.7–4.0)
MCH: 29.7 pg (ref 26.0–34.0)
MCHC: 30.6 g/dL (ref 30.0–36.0)
MCV: 97.1 fL (ref 80.0–100.0)
Monocytes Absolute: 0.7 K/uL (ref 0.1–1.0)
Monocytes Relative: 10 %
Neutro Abs: 4.1 K/uL (ref 1.7–7.7)
Neutrophils Relative %: 56 %
Platelets: 248 K/uL (ref 150–400)
RBC: 3.5 MIL/uL — ABNORMAL LOW (ref 3.87–5.11)
RDW: 15.9 % — ABNORMAL HIGH (ref 11.5–15.5)
WBC: 7.4 K/uL (ref 4.0–10.5)
nRBC: 0 % (ref 0.0–0.2)

## 2022-01-28 LAB — COMPREHENSIVE METABOLIC PANEL WITH GFR
ALT: 17 U/L (ref 0–44)
AST: 31 U/L (ref 15–41)
Albumin: 2.9 g/dL — ABNORMAL LOW (ref 3.5–5.0)
Alkaline Phosphatase: 124 U/L (ref 38–126)
Anion gap: 14 (ref 5–15)
BUN: 19 mg/dL (ref 8–23)
CO2: 22 mmol/L (ref 22–32)
Calcium: 8.8 mg/dL — ABNORMAL LOW (ref 8.9–10.3)
Chloride: 100 mmol/L (ref 98–111)
Creatinine, Ser: 1.05 mg/dL — ABNORMAL HIGH (ref 0.44–1.00)
GFR, Estimated: 55 mL/min — ABNORMAL LOW (ref >60)
Glucose, Bld: 92 mg/dL (ref 70–99)
Potassium: 5.2 mmol/L — ABNORMAL HIGH (ref 3.5–5.1)
Sodium: 136 mmol/L (ref 135–145)
Total Bilirubin: 0.2 mg/dL — ABNORMAL LOW (ref 0.3–1.2)
Total Protein: 7 g/dL (ref 6.5–8.1)

## 2022-01-28 LAB — SAMPLE TO BLOOD BANK

## 2022-01-28 NOTE — Progress Notes (Signed)
Hematology/Oncology progress note [phone] [phone]   Patient Care Team: Dionicia Abler, NP as PCP - General Clent Jacks, RN as Oncology Nurse Navigator Earlie Server, MD as Consulting Physician (Hematology and Oncology)  REFERRING PROVIDER: Dionicia Abler, NP  CHIEF COMPLAINTS/REASON FOR VISIT:  Follow-up for treatment of uterus neoplasm.  HISTORY OF PRESENTING ILLNESS:   Alexis Mclaughlin is a  76 y.o.  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  She was also noted to have lactic acidosis, leukocytosis, tachycardia. 06/01/2021, CT scan showed a markedly enlarged uterus with extensive fluid and air, consistent with intrauterine abscess. Blood cultures obtained on admission grew Clostridium perfringens.  She was treated with IV antibiotics, she passed a large amount of tissue vaginally on 06/02/2021-pathology showed necrotic poorly differentiated neoplasm.  She was transferred to [facility] for further management.  Patient was seen by gynecology oncology. 06/05/2021, status post robotic assisted laparoscopic total hysterectomy with bilateral salpingo-oophorectomy, excision of posterior cervix and upper vaginal, cystoscopy, D&C. Findings:  On EUA, enlarged 18cm bulbous uterus, minimally mobile. ON speculum exam, enlarged cervix almost entirely necrotic with necrotic tissue prolapsing through dilated cervical os. Necrotic tissue on D&C, no significant return of tissue and gritty texture felt almost immediately. Cervix approximately 6cm. On intra-abdominal entry, normal upper abdominal survey including liver edge, stomach, and omentum. Normal appearing small and large bowel. Uterus 18cm, bulbous at fundus. Atrophic appearing adnexa. No breach of uterine capsule by tumor. Retroperitoneum  significantly fibrotic and edematous, no obvious lymphadenopathy. After delivery of uterine specimen, significantly necrotic tissue noted along posterior colpotomy, likely with residual cervix in situ. After rectovaginal space created, additional posterior cervix excised down to level of healthy vagina. On cystoscopy, bladder dome intact, good efflux noted from bilateral ureteral orifices.   Pathology showed poorly differentiated malignant neoplasm with extensive necrosis and associated with superior of inflammation.  Tumor invades deep myometrium but does not involve serosal surface.  Cervix with separative inflammation and necrosis-no definitive evidence of tumor.  Right fallopian tube with suppurative inflammation and necrosis.  Benign unremarkable left fallopian tube and bilateral ovaries. Postop, patient gradually improves and was discharged to SNF on 06/08/2021..  06/20/2021, patient was seen by Dr. Berline Lopes for postop follow-up.  She appears physically recovering from her surgery very well. Patient's case was presented to GYN oncology tumor conference by Dr. Berline Lopes. 07/02/2021, repeat CT chest abdomen pelvis showed prominent low retroperitoneal, iliac, pelvic sidewall lymph nodes are unchanged.,  Nonspecific and modestly suspicious for nodal metastasis.  Attention on follow-up.  No noncontrast radiographic evidence of distant metastasis disease in the chest, abdomen or pelvis.  07/03/2021.  Follow-up visit with Dr. Berline Lopes Given that patient's mobility has improved and recovered well from her surgery.  Dr. Berline Lopes recommends adjuvant chemotherapy most likely carboplatin alone or dose reduced carboplatin and paclitaxel.  Since patient lives closer to [facility], patient was referred to establish care with [facility] cancer center for further management.  Patient has baseline dementia, she goes to senior care facility via [facility].   10/05/2021 CT chest abdomen pelvis shows no recurrence or metastatic  disease.  No evidence of pelvic sidewall nodularity or lymphadenopathy.  Incidental findings of cholelithiasis and benign left renal cyst. I discussed CT findings with patient's gynecology oncologist Dr. Berline Lopes and she recommends to continue another 3 cycles of adjuvant chemotherapy  if the patient is able to tolerate.  INTERVAL HISTORY Alexis Mclaughlin is a 76 y.o. female who has above history reviewed by me today presents for follow up visit for evaluation prior to adjuvant chemotherapy. Patient was accompanied by her daughter. According to patient and daughter, patient has been doing well.  No new complaints she has an appointment with gynecology oncology Dr. Berline Lopes at the end of this month. No nausea vomiting diarrhea She has lost some weight.  176 pounds today.  Review of Systems  Unable to perform ROS: Dementia  Constitutional:  Negative for fever.  Genitourinary:  Negative for vaginal bleeding and vaginal discharge.    MEDICAL HISTORY:  Past Medical History:  Diagnosis Date   Arthritis    Constipation    Dementia ([place])    Diabetes mellitus without complication (HCC)    GERD (gastroesophageal reflux disease)    Hypertension    Hypothyroidism    Neutropenia ([place]) 12/10/2021    SURGICAL HISTORY: Past Surgical History:  Procedure Laterality Date   BREAST BIOPSY Right 09/02/2018   affirm stereo/path pending   CYSTOSCOPY  06/05/2021   Procedure: CYSTOSCOPY;  Surgeon: [name], MD;  Location: [facility];  Service: Gynecology;;   DILATION AND CURETTAGE OF UTERUS N/A 06/05/2021   Procedure: DILATATION AND CURETTAGE UNDER LAPAROSCOPIC GUIDANCE;  Surgeon: [name], MD;  Location: [facility];  Service: Gynecology;  Laterality: N/A;   LAPAROSCOPY N/A 06/05/2021   Procedure: LAPAROSCOPY OPERATIVE;  Surgeon: [name], MD;  Location: [facility];  Service: Gynecology;  Laterality: N/A;   ROBOTIC ASSISTED TOTAL HYSTERECTOMY WITH BILATERAL SALPINGO OOPHERECTOMY N/A  06/05/2021   Procedure: XI ROBOTIC ASSISTED TOTAL HYSTERECTOMY WITH BILATERAL SALPINGO OOPHORECTOMY;  Surgeon: [name], MD;  Location: [facility];  Service: Gynecology;  Laterality: N/A;   TUBAL LIGATION      SOCIAL HISTORY: Social History   Socioeconomic History   Marital status: Widowed    Spouse name: Not on file   Number of children: Not on file   Years of education: Not on file   Highest education level: Not on file  Occupational History   Not on file  Tobacco Use   Smoking status: Former    Types: Cigarettes   Smokeless tobacco: Never   Tobacco comments:    Quit at least 30 years ago (prior to 1990)  Vaping Use   Vaping Use: Never used  Substance and Sexual Activity   Alcohol use: Not Currently   Drug use: Never   Sexual activity: Not Currently  Other Topics Concern   Not on file  Social History Narrative   Not on file   Social Determinants of Health   Financial Resource Strain: Not on file  Food Insecurity: Not on file  Transportation Needs: Not on file  Physical Activity: Not on file  Stress: Not on file  Social Connections: Not on file  Intimate Partner Violence: Not on file    FAMILY HISTORY: Family History  Problem Relation Age of Onset   CAD Mother    CAD Father    Cancer Neg Hx     ALLERGIES:  is allergic to ace inhibitors.  MEDICATIONS:  Current Outpatient Medications  Medication Sig Dispense Refill   acetaminophen (TYLENOL) 650 MG CR tablet SMARTSIG:1 By Mouth Daily PRN     amLODipine (NORVASC) 10 MG tablet Take 10 mg by mouth daily.     ANTI-ITCH lotion Apply topically. SARNA LOTION     aspirin EC 81 MG  tablet Take 81 mg by mouth daily. Swallow whole.     busPIRone (BUSPAR) 5 MG tablet Take 5 mg by mouth in the morning and at bedtime.     cholecalciferol (VITAMIN D3) 25 MCG (1000 UNIT) tablet Take 1,000 Units by mouth daily.     diclofenac Sodium (VOLTAREN) 1 % GEL SMARTSIG:Gram(s) Topical Twice Daily     docusate sodium (COLACE)  100 MG capsule Take 100 mg by mouth daily.     DULoxetine (CYMBALTA) 30 MG capsule Take 30 mg by mouth daily.     DULoxetine (CYMBALTA) 60 MG capsule Take 60 mg by mouth daily.     Ensure (ENSURE) Take by mouth.     ferrous sulfate 325 (65 FE) MG EC tablet Take 1 tablet (325 mg total) by mouth daily. 30 tablet 1   fluticasone (FLONASE) 50 MCG/ACT nasal spray Place 2 sprays into both nostrils daily.     furosemide (LASIX) 20 MG tablet Take 20 mg by mouth.     gabapentin (NEURONTIN) 100 MG capsule Take 1 capsule (100 mg total) by mouth at bedtime for 30 days, THEN 2 capsules (200 mg total) at bedtime for 30 days, THEN 3 capsules (300 mg total) at bedtime. Follow written titration schedule.. 180 capsule 0   levothyroxine (SYNTHROID) 50 MCG tablet Take 50 mcg by mouth daily before breakfast.     lidocaine (LIDODERM) 5 % Place 2 patches onto the skin daily as needed (pain). Remove & Discard patch within 12 hours or as directed by MD     LIPITOR 80 MG tablet Take 1 tablet by mouth daily.     LORazepam (ATIVAN) 0.5 MG tablet SMARTSIG:0.5 Milligram(s) By Mouth Every Morning     metFORMIN (GLUCOPHAGE-XR) 500 MG 24 hr tablet Take 500 mg by mouth 2 (two) times daily.     metoprolol succinate (TOPROL-XL) 25 MG 24 hr tablet Take 1 tablet (25 mg total) by mouth daily. 30 tablet 1   miconazole (MICOTIN) 2 % cream Apply 1 application topically at bedtime.     nystatin (MYCOSTATIN/NYSTOP) powder Apply 1 application topically 2 (two) times daily.     omeprazole (PRILOSEC) 20 MG capsule Take 20 mg by mouth daily.     ondansetron (ZOFRAN) 8 MG tablet Take 1 tablet (8 mg total) by mouth 2 (two) times daily as needed. Start on the third day after chemotherapy. 30 tablet 1   polyethylene glycol (MIRALAX / GLYCOLAX) 17 g packet Take 17 g by mouth 2 (two) times daily. 14 each 0   potassium chloride SA (KLOR-CON) 20 MEQ tablet Take 20 mEq by mouth daily.     prochlorperazine (COMPAZINE) 10 MG tablet Take 1 tablet (10 mg  total) by mouth every 6 (six) hours as needed (Nausea or vomiting). 30 tablet 1   traMADol (ULTRAM) 50 MG tablet Take by mouth.     traZODone (DESYREL) 50 MG tablet Take 25 mg by mouth at bedtime.     trolamine salicylate (ASPERCREME/ALOE) 10 % cream Apply 1 application topically every 6 (six) hours as needed for muscle pain.     Insulin Glargine (BASAGLAR KWIKPEN) 100 UNIT/ML Inject 14 Units into the skin daily. (Patient not taking: Reported on 10/17/2021)     sodium chloride (OCEAN) 0.65 % nasal spray Place 1 spray into the nose 2 (two) times daily as needed for congestion. (Patient not taking: Reported on 10/17/2021)     Witch Hazel (EQ HYGIENIC CLEANSING WIPES) PADS Apply 1 application topically 2 (two) times  daily. (Patient not taking: Reported on 10/17/2021)     No current facility-administered medications for this visit.     PHYSICAL EXAMINATION: ECOG PERFORMANCE STATUS: 2 - Symptomatic, <50% confined to bed Vitals:   01/28/22 1357  BP: 106/72  Pulse: 92  Resp: 20  Temp: 98.7 F (37.1 C)  SpO2: 100%   Filed Weights   01/28/22 1357  Weight: 176 lb (79.8 kg)       Physical Exam Constitutional:      General: She is not in acute distress.    Appearance: She is obese.     Comments: Patient sits in a wheelchair today.  HENT:     Head: Normocephalic and atraumatic.  Eyes:     General: No scleral icterus. Cardiovascular:     Rate and Rhythm: Normal rate.  Pulmonary:     Effort: Pulmonary effort is normal.  Abdominal:     General: There is no distension.  Musculoskeletal:        General: Normal range of motion.     Cervical back: Normal range of motion.  Skin:    Findings: No rash.  Neurological:     Mental Status: She is alert. Mental status is at baseline.     Cranial Nerves: No cranial nerve deficit.     Coordination: Coordination normal.     Comments: Orientated x 2 name and place.   Psychiatric:        Mood and Affect: Mood normal.    LABORATORY DATA:   I have reviewed the data as listed Lab Results  Component Value Date   WBC 7.4 01/28/2022   HGB 10.4 (L) 01/28/2022   HCT 34.0 (L) 01/28/2022   MCV 97.1 01/28/2022   PLT 248 01/28/2022   Recent Labs    11/07/21 0754 11/28/21 0754 01/28/22 1325  NA 134* 137 136  K 4.3 4.3 5.2*  CL 97* 102 100  CO2 23 22 22   GLUCOSE 162* 107* 92  BUN 21 15 19   CREATININE 1.01* 1.08* 1.05*  CALCIUM 8.5* 8.6* 8.8*  GFRNONAA 58* 53* 55*  PROT 7.1 7.0 7.0  ALBUMIN 3.3* 3.5 2.9*  AST 30 26 31   ALT 15 14 17   ALKPHOS 147* 137* 124  BILITOT 0.5 0.6 0.2*    Iron/TIBC/Ferritin/ %Sat    Component Value Date/Time   IRON 71 11/07/2021 0754   IRON 54 07/05/2014 0958   TIBC 192 (L) 11/07/2021 0754   TIBC 334 07/05/2014 0958   FERRITIN 845 (H) 11/07/2021 0754   FERRITIN 159 07/05/2014 0958   IRONPCTSAT 37 (H) 11/07/2021 0754   IRONPCTSAT 16 07/05/2014 0958       RADIOGRAPHIC STUDIES: I have personally reviewed the radiological images as listed and agreed with the findings in the report. No results found.    ASSESSMENT & PLAN:  1. Malignant neoplasm of uterus, unspecified site ([place])   2. Normocytic anemia   3. Hypokalemia   4. Hypoalbuminemia due to protein-calorie malnutrition ([place])    #Poorly differentiated neoplasm of uterus S/p 6 cycles of adjuvant carboplatin Taxol.  She has finished treatments.  I recommend patient to follow-up with gynecology oncology Dr. Berline Lopes for pelvic examination.   Patient had a negative CT scan in October 2022.  I will defer to gynecology oncology for image surveillance if needed.  #Chemotherapy-induced neuropathy, mild.  Stable symptoms. #Elevated creatinine, stable  Encourage oral hydration avoid nephrotoxins. # anemia, her hemoglobin has improved to 10.4.  This is close to her prechemotherapy baseline. #  Hyperkalemia, potassium level is slightly increased.  Recommend patient to stop taking potassium supplementation and repeat a potassium level with  primary care provider.  Daughter understands and will communicate with PCP.  #Dementia, follow-up with primary care provider. #Hypoalbuminemia, weight loss, recommend patient to see nutritionist.  Daughter understands will communicate with PCP.  All questions were answered. The patient knows to call the clinic with any problems questions or concerns.  cc Dionicia Abler, NP  Return of visit:  6 months  Earlie Server, MD, PhD 01/28/2022

## 2022-02-19 ENCOUNTER — Ambulatory Visit
Admission: RE | Admit: 2022-02-19 | Discharge: 2022-02-19 | Disposition: A | Discharge status: Home / Self Care | Payer: Medicare (Managed Care) | Source: Ambulatory Visit | Attending: Student in an Organized Health Care Education/Training Program | Admitting: Student in an Organized Health Care Education/Training Program

## 2022-02-19 ENCOUNTER — Ambulatory Visit
Admission: RE | Admit: 2022-02-19 | Discharge: 2022-02-19 | Disposition: A | Discharge status: Home / Self Care | Payer: Medicare (Managed Care) | Attending: Student in an Organized Health Care Education/Training Program | Admitting: Student in an Organized Health Care Education/Training Program

## 2022-02-19 ENCOUNTER — Ambulatory Visit (HOSPITAL_BASED_OUTPATIENT_CLINIC_OR_DEPARTMENT_OTHER): Payer: Medicare (Managed Care) | Admitting: Student in an Organized Health Care Education/Training Program

## 2022-02-19 ENCOUNTER — Other Ambulatory Visit: Payer: Self-pay | Admitting: Student in an Organized Health Care Education/Training Program

## 2022-02-19 ENCOUNTER — Other Ambulatory Visit: Payer: Self-pay

## 2022-02-19 ENCOUNTER — Encounter: Payer: Self-pay | Admitting: Student in an Organized Health Care Education/Training Program

## 2022-02-19 VITALS — BP 104/75 | HR 102 | Temp 96.8°F | Resp 16 | Ht 61.0 in | Wt 176.0 lb

## 2022-02-19 DIAGNOSIS — G894 Chronic pain syndrome: Secondary | ICD-10-CM

## 2022-02-19 DIAGNOSIS — G6289 Other specified polyneuropathies: Secondary | ICD-10-CM | POA: Diagnosis present

## 2022-02-19 DIAGNOSIS — M25511 Pain in right shoulder: Secondary | ICD-10-CM | POA: Diagnosis present

## 2022-02-19 DIAGNOSIS — G8929 Other chronic pain: Secondary | ICD-10-CM

## 2022-02-19 DIAGNOSIS — M47816 Spondylosis without myelopathy or radiculopathy, lumbar region: Secondary | ICD-10-CM | POA: Diagnosis present

## 2022-02-19 DIAGNOSIS — C55 Malignant neoplasm of uterus, part unspecified: Secondary | ICD-10-CM | POA: Diagnosis present

## 2022-02-19 NOTE — Progress Notes (Signed)
Orders Placed This Encounter  Procedures   DG Lumbar Spine Complete W/Bend    Patient presents with axial pain with possible radicular component. Please assist [place] in identifying specific level(s) and laterality of any additional findings such as: 1. Facet (Zygapophyseal) joint DJD (Hypertrophy, space narrowing, subchondral sclerosis, and/or osteophyte formation) 2. DDD and/or IVDD (Loss of disc height, desiccation, gas patterns, osteophytes, endplate sclerosis, or "Black disc disease") 3. Pars defects 4. Spondylolisthesis, spondylosis, and/or spondyloarthropathies (include Degree/Grade of displacement in mm) (stability) 5. Vertebral body Fractures (acute/chronic) (state percentage of collapse) 6. Demineralization (osteopenia/osteoporotic) 7. Bone pathology 8. Foraminal narrowing  9. Surgical changes    Standing Status:   Future    Standing Expiration Date:   03/19/2022    Scheduling Instructions:     Imaging must be done as soon as possible. Inform patient that order will expire within 30 days and I will not renew it.    Order Specific Question:   Reason for Exam (SYMPTOM  OR DIAGNOSIS REQUIRED)    Answer:   Low back pain    Order Specific Question:   Preferred imaging location?    Answer:   [facility]    Order Specific Question:   Call Results- Best Contact Number?    Answer:   [phone] ([facility])    Order Specific Question:   Radiology Contrast Protocol - do NOT remove file path    Answer:   [email or web address]    Order Specific Question:   Release to patient    Answer:   Immediate   DG Si Joints    Standing Status:   Future    Standing Expiration Date:   03/19/2022    Scheduling Instructions:     Imaging must be done as soon as possible. Inform patient that order will expire within 30 days and I will not renew it.    Order Specific Question:   Reason for Exam (SYMPTOM  OR DIAGNOSIS REQUIRED)    Answer:   Sacroiliac joint  pain    Order Specific Question:   Preferred imaging location?    Answer:   [facility]    Order Specific Question:   Call Results- Best Contact Number?    Answer:   [phone] ([facility])    Order Specific Question:   Release to patient    Answer:   Immediate   DG Sacrum/Coccyx    Order Specific Question:   Reason for Exam (SYMPTOM  OR DIAGNOSIS REQUIRED)    Answer:   low back pain    Order Specific Question:   Preferred imaging location?    Answer:   [facility]    Order Specific Question:   Release to patient    Answer:   Immediate   DG Thoracic Spine 4V    Patient presents with axial pain with possible radicular component. Please assist [place] in identifying specific level(s) and laterality of any additional findings such as: 1. Facet (Zygapophyseal) joint DJD (Hypertrophy, space narrowing, subchondral sclerosis, and/or osteophyte formation) 2. DDD and/or IVDD (Loss of disc height, desiccation, gas patterns, osteophytes, endplate sclerosis, or "Black disc disease") 3. Pars defects 4. Spondylolisthesis, spondylosis, and/or spondyloarthropathies (include Degree/Grade of displacement in mm) (stability) 5. Vertebral body Fractures (acute/chronic) (state percentage of collapse) 6. Demineralization (osteopenia/osteoporotic) 7. Bone pathology 8. Foraminal narrowing  9. Surgical changes    Standing Status:   Future    Standing Expiration Date:   05/19/2022    Order Specific Question:   Reason for  Exam (SYMPTOM  OR DIAGNOSIS REQUIRED)    Answer:   Upper back pain and/or thoracic spine pain.    Order Specific Question:   Preferred imaging location?    Answer:   [facility]    Order Specific Question:   Call Results- Best Contact Number?    Answer:   [phone] ([facility])   Compliance Drug Analysis, Ur    Volume: 30 ml(s). Minimum 3 ml of urine is needed. Document temperature of fresh sample. Indications: Long term (current) use of opiate analgesic  (U11.031) Test#: [id number] (Comprehensive Profile)    Order Specific Question:   Release to patient    Answer:   Immediate

## 2022-04-18 ENCOUNTER — Ambulatory Visit
Payer: Medicare (Managed Care) | Attending: Student in an Organized Health Care Education/Training Program | Admitting: Student in an Organized Health Care Education/Training Program

## 2022-04-24 LAB — COMPLIANCE DRUG ANALYSIS, UR

## 2022-05-14 ENCOUNTER — Ambulatory Visit
Payer: Medicare (Managed Care) | Attending: Student in an Organized Health Care Education/Training Program | Admitting: Student in an Organized Health Care Education/Training Program

## 2022-05-14 ENCOUNTER — Encounter: Payer: Self-pay | Admitting: Student in an Organized Health Care Education/Training Program

## 2022-05-14 VITALS — BP 95/73 | HR 132 | Temp 96.9°F | Resp 18 | Wt 146.0 lb

## 2022-05-14 DIAGNOSIS — M25511 Pain in right shoulder: Secondary | ICD-10-CM | POA: Insufficient documentation

## 2022-05-14 DIAGNOSIS — M51369 Other intervertebral disc degeneration, lumbar region without mention of lumbar back pain or lower extremity pain: Secondary | ICD-10-CM

## 2022-05-14 DIAGNOSIS — G8929 Other chronic pain: Secondary | ICD-10-CM | POA: Diagnosis present

## 2022-05-14 DIAGNOSIS — G894 Chronic pain syndrome: Secondary | ICD-10-CM

## 2022-05-14 DIAGNOSIS — G6289 Other specified polyneuropathies: Secondary | ICD-10-CM

## 2022-05-14 DIAGNOSIS — C55 Malignant neoplasm of uterus, part unspecified: Secondary | ICD-10-CM

## 2022-05-14 DIAGNOSIS — M19019 Primary osteoarthritis, unspecified shoulder: Secondary | ICD-10-CM

## 2022-05-14 DIAGNOSIS — C541 Malignant neoplasm of endometrium: Secondary | ICD-10-CM | POA: Diagnosis present

## 2022-05-14 DIAGNOSIS — M47816 Spondylosis without myelopathy or radiculopathy, lumbar region: Secondary | ICD-10-CM

## 2022-05-14 DIAGNOSIS — M25512 Pain in left shoulder: Secondary | ICD-10-CM | POA: Insufficient documentation

## 2022-05-14 DIAGNOSIS — M19011 Primary osteoarthritis, right shoulder: Secondary | ICD-10-CM | POA: Diagnosis present

## 2022-05-14 DIAGNOSIS — M48062 Spinal stenosis, lumbar region with neurogenic claudication: Secondary | ICD-10-CM

## 2022-05-14 DIAGNOSIS — M19012 Primary osteoarthritis, left shoulder: Secondary | ICD-10-CM | POA: Insufficient documentation

## 2022-05-14 DIAGNOSIS — M5136 Other intervertebral disc degeneration, lumbar region: Secondary | ICD-10-CM | POA: Diagnosis present

## 2022-05-14 MED ORDER — ORPHENADRINE CITRATE 30 MG/ML IJ SOLN
INTRAMUSCULAR | Status: AC
  Filled 2022-05-14: qty 2

## 2022-05-14 MED ORDER — ORPHENADRINE CITRATE 30 MG/ML IJ SOLN
30.0000 mg | Freq: Once | INTRAMUSCULAR | Status: AC
  Administered 2022-05-14: 30 mg via INTRAMUSCULAR

## 2022-05-14 NOTE — Progress Notes (Signed)
Safety precautions to be maintained throughout the outpatient stay will include: orient to surroundings, keep bed in low position, maintain call bell within reach at all times, provide assistance with transfer out of bed and ambulation.  

## 2022-05-14 NOTE — Patient Instructions (Signed)
______________________________________________________________________  Preparing for your procedure (without sedation)  Procedure appointments are limited to planned procedures: No Prescription Refills. No disability issues will be discussed. No medication changes will be discussed.  Instructions: Food Intake: Avoid eating anything for at least 4 hours prior to your procedure. Transportation: Unless otherwise stated by your physician, bring a driver. Morning Medicines: Take all of your scheduled morning medications. If you take heart medicine, except for blood thinners, do not forget to take it the morning of the procedure. If your Diastolic (lower reading) is above 100 mmHg, elective cases will be cancelled/rescheduled. Blood thinners: These will need to be stopped for procedures. Notify our staff if you are taking any blood thinners. Depending on which one you take, there will be specific instructions on how and when to stop it. Diabetics on insulin: Notify the staff so that you can be scheduled 1st case in the morning. If your diabetes requires high dose insulin, take only  of your normal insulin dose the morning of the procedure and notify the staff that you have done so. Preventing infections: Shower with an antibacterial soap the morning of your procedure.  Build-up your immune system: Take 1000 mg of Vitamin C with every meal (3 times a day) the day prior to your procedure. Antibiotics: Inform the staff if you have a condition or reason that requires you to take antibiotics before dental procedures. Pregnancy: If you are pregnant, call and cancel the procedure. Sickness: If you have a cold, fever, or any active infections, call and cancel the procedure. Arrival: You must be in the facility at least 30 minutes prior to your scheduled procedure. Children: Do not bring any children with you. Dress appropriately: There is always a possibility that your clothing may get soiled. Valuables:  Do not bring any jewelry or valuables.  Reasons to call and reschedule or cancel your procedure: (Following these recommendations will minimize the risk of a serious complication.) Surgeries: Avoid having procedures within 2 weeks of any surgery. (Avoid for 2 weeks before or after any surgery). Flu Shots: Avoid having procedures within 2 weeks of a flu shots or . (Avoid for 2 weeks before or after immunizations). Barium: Avoid having a procedure within 7-10 days after having had a radiological study involving the use of radiological contrast. (Myelograms, Barium swallow or enema study). Heart attacks: Avoid any elective procedures or surgeries for the initial 6 months after a "Myocardial Infarction" (Heart Attack). Blood thinners: It is imperative that you stop these medications before procedures. Let us know if you if you take any blood thinner.  Infection: Avoid procedures during or within two weeks of an infection (including chest colds or gastrointestinal problems). Symptoms associated with infections include: Localized redness, fever, chills, night sweats or profuse sweating, burning sensation when voiding, cough, congestion, stuffiness, runny nose, sore throat, diarrhea, nausea, vomiting, cold or Flu symptoms, recent or current infections. It is specially important if the infection is over the area that we intend to treat. Heart and lung problems: Symptoms that may suggest an active cardiopulmonary problem include: cough, chest pain, breathing difficulties or shortness of breath, dizziness, ankle swelling, uncontrolled high or unusually low blood pressure, and/or palpitations. If you are experiencing any of these symptoms, cancel your procedure and contact your primary care physician for an evaluation.  Remember:  Regular Business hours are:  Monday to Thursday 8:00 AM to 4:00 PM  Provider's Schedule: Milinda Pointer, MD:  Procedure days: Tuesday and Thursday 7:30 AM to 4:00 PM  Bilal  Lateef, MD:  Procedure days: Monday and Wednesday 7:30 AM to 4:00 PM ______________________________________________________________________   

## 2022-05-14 NOTE — Progress Notes (Signed)
PROVIDER NOTE: Information contained herein reflects review and annotations entered in association with encounter. Interpretation of such information and data should be left to medically-trained personnel. Information provided to patient can be located elsewhere in the medical record under "Patient Instructions". Document created using STT-dictation technology, any transcriptional errors that may result from process are unintentional.    Patient: Alexis Mclaughlin  Service Category: E/M  Provider: Gillis Santa, MD  DOB: 1946-02-25  DOS: 05/14/2022  Specialty: Interventional Pain Management  MRN: [id number]  Setting: Ambulatory outpatient  PCP: Dionicia Abler, NP  Type: Established Patient    Referring Provider: Dionicia Abler, NP  Location: Office  Delivery: Face-to-face     HPI  Ms. Alexis Mclaughlin, a 76 y.o. year old female, is here today because of her Chronic pain of both shoulders [M25.511, G89.29, M25.512]. [name] primary complain today is No chief complaint on file. Last encounter: My last encounter with her was on 04/18/2022. Pertinent problems: [name] has Chronic pain syndrome; Lumbar facet arthropathy; Peripheral neuropathy; Glenohumeral arthritis; Primary osteoarthritis of both shoulders; Spinal stenosis, lumbar region, with neurogenic claudication; and Lumbar degenerative disc disease on their pertinent problem list. Pain Assessment: Severity of Chronic pain is reported as a 8 /10. Location: Shoulder Right, Left/upper arms bilateral. Onset: More than a month ago. Quality: Aching, Constant. Timing: Constant. Modifying factor(s): "Maybe shots". Vitals:  weight is 146 lb (66.2 kg). Her temperature is 96.9 F (36.1 C) (abnormal). Her blood pressure is 95/73 and her pulse is 132 (abnormal). Her respiration is 18 and oxygen saturation is 100%.   Reason for encounter:  Chronic pain most pronounced in bilateral shoulders and lower back.  She has limited range of motion of both shoulders secondary  to the glenohumeral joint arthropathy.  She has tried physical therapy exercises for this in the past.  She did obtain benefit from a shoulder steroid injection that was done over 2 years ago.  We discussed repeating and patient is interested in this.  I also discussed stretching and strengthening exercises that she could do for her lower back.  Future considerations would include diagnostic lumbar facet medial branch nerve blocks for lumbar facet arthropathy and lumbar spondylosis.  She could be a candidate for lumbar radiofrequency ablation.  Patient also endorses paresthesias of bilateral feet.  She does have type 2 diabetes.  She could be a candidate for Qutenza for painful diabetic neuropathy.    ROS  Constitutional: Denies any fever or chills Gastrointestinal: No reported hemesis, hematochezia, vomiting, or acute GI distress Musculoskeletal:  Bilateral shoulder, lower back pain Neurological:  Bilateral lower extremity paresthesias  Medication Review  Basaglar KwikPen, DULoxetine, EQ Hygienic Cleansing Wipes, Ensure, LORazepam, QUEtiapine, acetaminophen, amLODipine, aspirin EC, atorvastatin, busPIRone, camphor-menthol, cholecalciferol, diclofenac Sodium, docusate sodium, ferrous sulfate, fluticasone, furosemide, gabapentin, hydrochlorothiazide, levothyroxine, lidocaine, metFORMIN, metoprolol succinate, miconazole, nystatin, omeprazole, ondansetron, polyethylene glycol, potassium chloride SA, prochlorperazine, sodium chloride, traMADol, traZODone, and trolamine salicylate  History Review  Allergy: Ms. Wax is allergic to ace inhibitors. Drug: [name]  reports no history of drug use. Alcohol:  reports that she does not currently use alcohol. Tobacco:  reports that she has quit smoking. Her smoking use included cigarettes. She has never used smokeless tobacco. Social: [name]  reports that she has quit smoking. Her smoking use included cigarettes. She has never used smokeless tobacco.  She reports that she does not currently use alcohol. She reports that she does not use drugs. Medical:  has a past medical history of Arthritis, Constipation,  Dementia ([place]), Diabetes mellitus without complication ([place]), GERD (gastroesophageal reflux disease), Hypertension, Hypothyroidism, and Neutropenia ([place]) (12/10/2021). Surgical: [name]  has a past surgical history that includes Breast biopsy (Right, 09/02/2018); Tubal ligation; Dilation and curettage of uterus (N/A, 06/05/2021); laparoscopy (N/A, 06/05/2021); Robotic assisted total hysterectomy with bilateral salpingo oophorectomy (N/A, 06/05/2021); and Cystoscopy (06/05/2021). Family: family history includes CAD in her father and mother.  Laboratory Chemistry Profile   Renal Lab Results  Component Value Date   BUN 19 01/28/2022   CREATININE 1.05 (H) 01/28/2022   GFRAA 49 (L) 02/14/2020   GFRNONAA 55 (L) 01/28/2022    Hepatic Lab Results  Component Value Date   AST 31 01/28/2022   ALT 17 01/28/2022   ALBUMIN 2.9 (L) 01/28/2022   ALKPHOS 124 01/28/2022    Electrolytes Lab Results  Component Value Date   NA 136 01/28/2022   K 5.2 (H) 01/28/2022   CL 100 01/28/2022   CALCIUM 8.8 (L) 01/28/2022   MG 1.7 06/05/2021   PHOS 3.3 06/05/2021    Bone No results found for: VD25OH, [id number], [id number], [id number], 25OHVITD1, 25OHVITD2, 25OHVITD3, TESTOFREE, TESTOSTERONE  Inflammation (CRP: Acute Phase) (ESR: Chronic Phase) Lab Results  Component Value Date   LATICACIDVEN 2.3 ([place]) 06/01/2021         Note: Above Lab results reviewed.  Recent Imaging Review  DG Thoracic Spine 2 View CLINICAL DATA:  Upper back and thoracic spine pain for several months  EXAM: THORACIC SPINE 2 VIEWS  COMPARISON:  None.  FINDINGS: Frontal and lateral views of the thoracic spine are obtained. Alignment is anatomic. No acute fractures. Mild diffuse disc space narrowing and anterior osteophyte formation. Paraspinal soft tissues are  unremarkable.  IMPRESSION: 1. Mild diffuse thoracic spondylosis.  No acute fracture.  Electronically Signed   By: Randa Ngo M.D.   On: 02/20/2022 22:30 DG Si Joints CLINICAL DATA:  Neck pain for several months  EXAM: BILATERAL SACROILIAC JOINTS - 3+ VIEW  COMPARISON:  None.  FINDINGS: Frontal and bilateral oblique views of the sacroiliac joints are obtained. Joint spaces are symmetrical. No erosive change. Visualized portions of the bony pelvis and sacrum are unremarkable. Prominent facet hypertrophic changes at the lumbosacral junction.  IMPRESSION: 1. Unremarkable sacroiliac joints.  Electronically Signed   By: Randa Ngo M.D.   On: 02/20/2022 22:29 DG Lumbar Spine Complete W/Bend CLINICAL DATA:  Low back pain for several months  EXAM: LUMBAR SPINE - COMPLETE WITH BENDING VIEWS  COMPARISON:  None.  FINDINGS: Frontal, bilateral oblique, lateral neutral, lateral flexion, lateral extension views of the lumbar spine are obtained. There are 5 non-rib-bearing lumbar type vertebral bodies in normal anatomic alignment. No acute displaced fracture. There is diffuse facet hypertrophy, greatest at the lumbosacral junction. Mild disc space narrowing at the L5-S1 level. No instability with flexion or extension. Sacroiliac joints are unremarkable.  IMPRESSION: 1. Diffuse lumbar facet hypertrophy greatest at the lumbosacral junction. 2. No acute bony abnormality. 3. No instability with flexion or extension.  Electronically Signed   By: Randa Ngo M.D.   On: 02/20/2022 22:29 DG Sacrum/Coccyx CLINICAL DATA:  Low back pain for several months  EXAM: SACRUM AND COCCYX - 2+ VIEW  COMPARISON:  10/05/2021  FINDINGS: Frontal and lateral views of the sacrum and coccyx are obtained. No acute displaced fractures. Sacroiliac joints are unremarkable. Prominent spondylosis and facet hypertrophy at the lumbosacral junction.  IMPRESSION: 1. No acute displaced  fracture.  Electronically Signed   By: Diana Eves.D.  On: 02/20/2022 22:28 Note: Reviewed        Physical Exam  General appearance: Well nourished, well developed, and well hydrated. In no apparent acute distress Mental status: Alert, oriented x 3 (person, place, & time)       Respiratory: No evidence of acute respiratory distress Eyes: PERLA Vitals: BP 95/73   Pulse (!) 132   Temp (!) 96.9 F (36.1 C)   Resp 18   Wt 146 lb (66.2 kg)   SpO2 100%   BMI 27.59 kg/m  BMI: Estimated body mass index is 27.59 kg/m as calculated from the following:   Height as of 02/19/22: _0  (1.549 m).   Weight as of this encounter: 146 lb (66.2 kg). Ideal: Ideal body weight: 47.8 kg (105 lb 6.1 oz) Adjusted ideal body weight: 55.2 kg (121 lb 10 oz)    Assessment   Diagnosis Status  1. Chronic pain of both shoulders   2. Primary osteoarthritis of both shoulders   3. Glenohumeral arthritis   4. Lumbar spondylosis   5. Spinal stenosis, lumbar region, with neurogenic claudication   6. Lumbar degenerative disc disease   7. Lumbar facet arthropathy   8. Other polyneuropathy   9. Malignant neoplasm of endometrium ([place])   10. Malignant neoplasm of uterus, unspecified site ([place])   11. Chronic pain syndrome    Having a Flare-up Deteriorating Deteriorating   Updated Problems: Problem  Glenohumeral Arthritis  Primary Osteoarthritis of Both Shoulders  Spinal Stenosis, Lumbar Region, With Neurogenic Claudication  Lumbar Degenerative Disc Disease  Chronic Pain Syndrome  Lumbar Facet Arthropathy  Peripheral Neuropathy  Chronic Pain of Both Shoulders    Plan of Care    Ms. Alexis Mclaughlin has a current medication list which includes the following long-term medication(s): duloxetine, duloxetine, fluticasone, basaglar kwikpen, levothyroxine, metformin, metoprolol succinate, omeprazole, prochlorperazine, sodium chloride, trazodone, ferrous sulfate, gabapentin, [DISCONTINUED]  hydrochlorothiazide, and [DISCONTINUED] quetiapine.  Pharmacotherapy (Medications Ordered): Meds ordered this encounter  Medications   orphenadrine (NORFLEX) injection 30 mg   Orders:  Orders Placed This Encounter  Procedures   SHOULDER INJECTION    Standing Status:   Future    Standing Expiration Date:   07/14/2022    Scheduling Instructions:     Procedure: Intra-articular shoulder (Glenohumeral) joint injection     Side: Bilateral     Level: Glenohumeral joint    Order Specific Question:   Where will this procedure be performed?    Answer:   [facility]    Shoulder osteoarthritis: Future considerations include suprascapular nerve block and possible radiofrequency ablation.  Intramuscular Norflex today for shoulder pain and spasms.  Lumbar facet arthropathy, lumbar degenerative disc disease: Has tried physical therapy.  Encouraged her to continue with very simple stretching exercises to maintain paraspinal conditioning.  Patient could consider diagnostic lumbar facet medial branch nerve blocks and possible RFA.  Painful diabetic neuropathy: Consider Qutenza for PDN.     Follow-up plan:   Return in about 1 week (around 05/21/2022) for B/L shoulder joint injection, in clinic [facility].    Recent Visits Date Type Provider Dept  02/19/22 Office Visit Gillis Santa, MD [facility]  Showing recent visits within past 90 days and meeting all other requirements Today's Visits Date Type Provider Dept  05/14/22 Office Visit Gillis Santa, MD [facility]  Showing today's visits and meeting all other requirements Future Appointments No visits were found meeting these conditions. Showing future appointments within next 90 days and meeting all other requirements  I discussed the assessment and treatment plan with the patient. The patient was provided an opportunity to ask questions and all were answered. The patient agreed with the plan and demonstrated an  understanding of the instructions.  Patient advised to call back or seek an in-person evaluation if the symptoms or condition worsens.  Duration of encounter: 6mnutes.  Note by: BGillis Santa MD Date: 05/14/2022; Time: 9:45 AM

## 2022-06-08 ENCOUNTER — Other Ambulatory Visit: Payer: Self-pay | Admitting: Nurse Practitioner

## 2022-07-10 ENCOUNTER — Ambulatory Visit: Payer: Medicare (Managed Care) | Admitting: Student in an Organized Health Care Education/Training Program

## 2022-07-23 DEATH — deceased

## 2022-07-29 ENCOUNTER — Other Ambulatory Visit: Payer: Medicare (Managed Care)

## 2022-07-29 ENCOUNTER — Ambulatory Visit: Payer: Medicare (Managed Care) | Admitting: Oncology

## 2023-01-26 IMAGING — US US PELVIS COMPLETE
1 series · 13 of 25 positions shown · non-contrast
Comparison: CT 06/01/2021
COMPARISON: CT 06/01/2021

Addendum:
CLINICAL DATA: Possible uterine mass.

EXAM:
TRANSABDOMINAL ULTRASOUND OF PELVIS
TECHNIQUE: Transabdominal ultrasound examination of the pelvis was performed
including evaluation of the uterus, ovaries, adnexal regions, and
pelvic cul-de-sac.

[Series 1: us pelvis (transabdominal only) · 54 acquisitions, 13 frames shown]
[im 1/54]
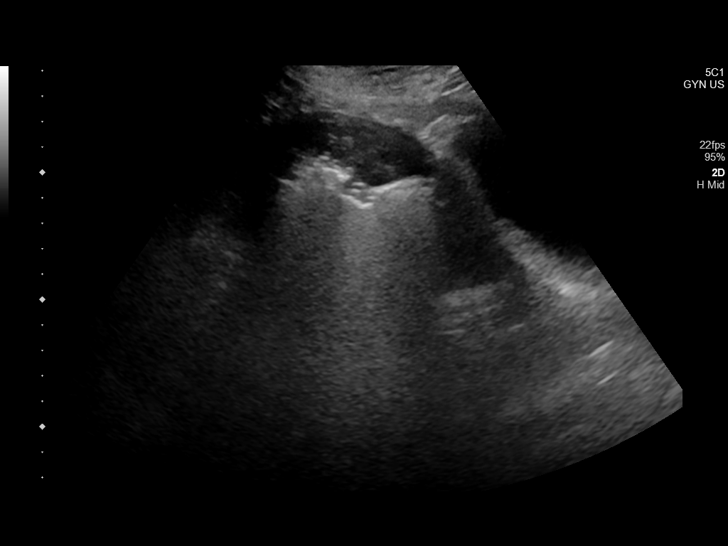
[im 5/54]
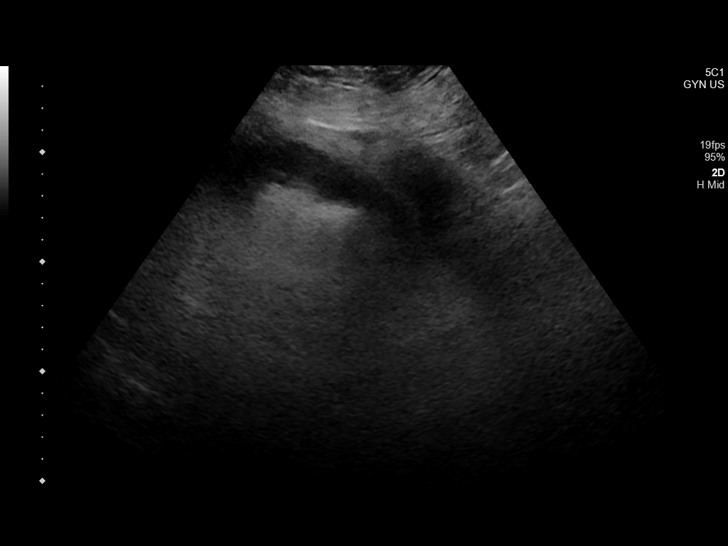
[im 9/54]
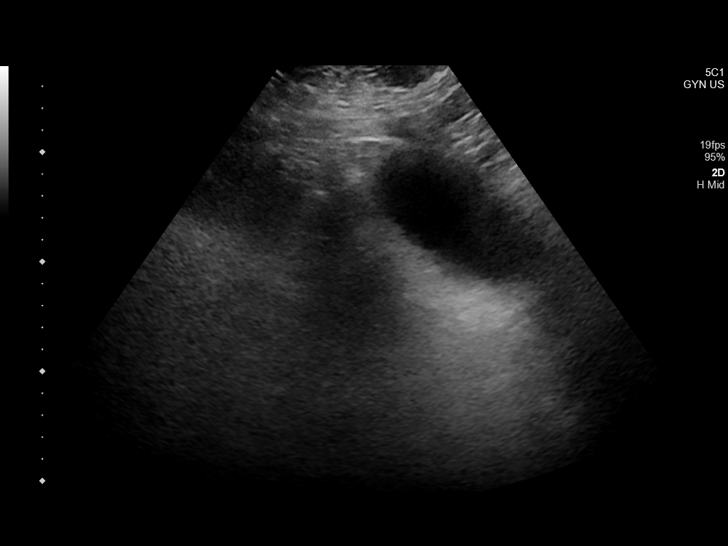
[im 14/54]
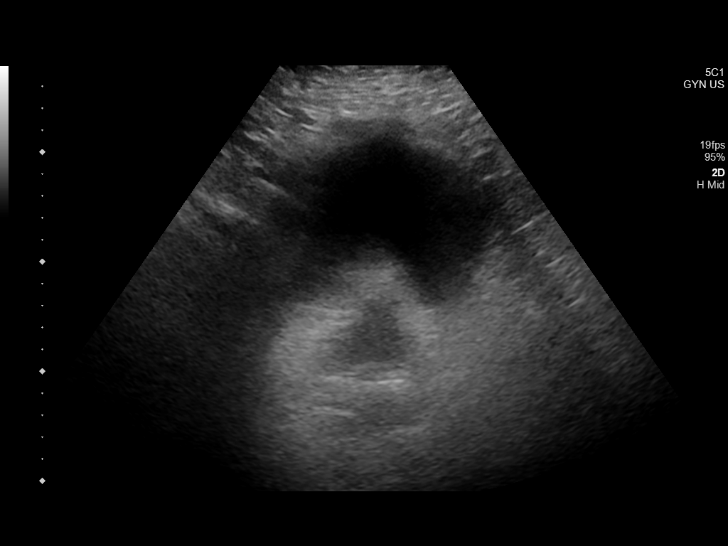
[im 18/54]
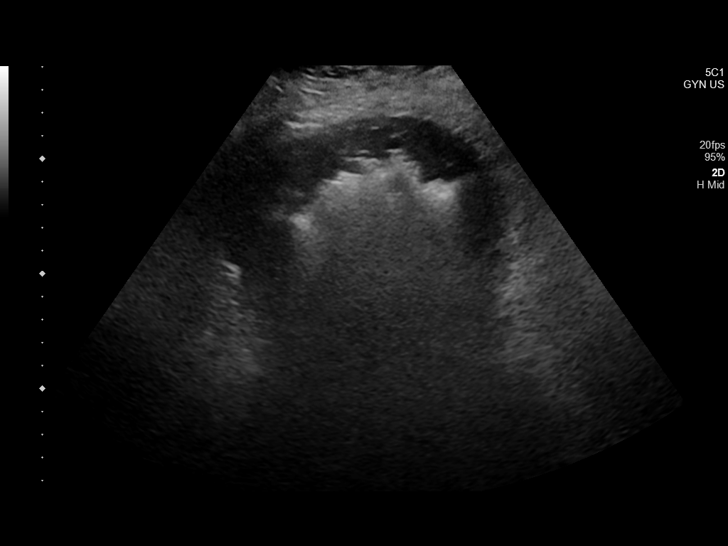
[im 23/54]
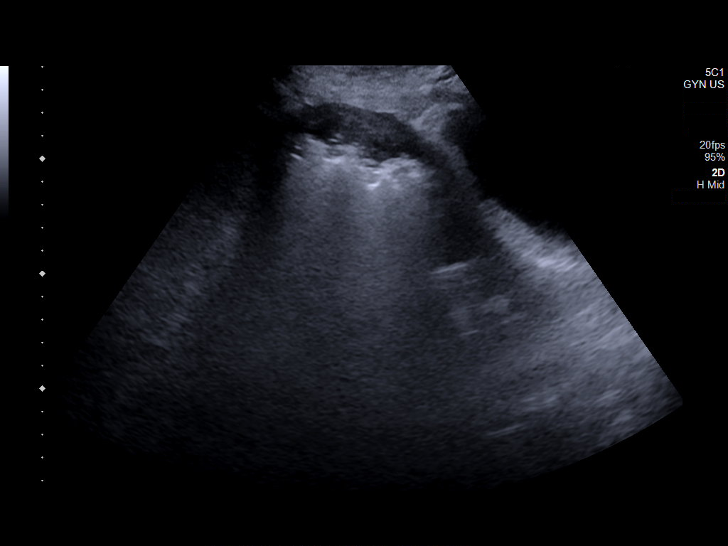
[im 27/54]
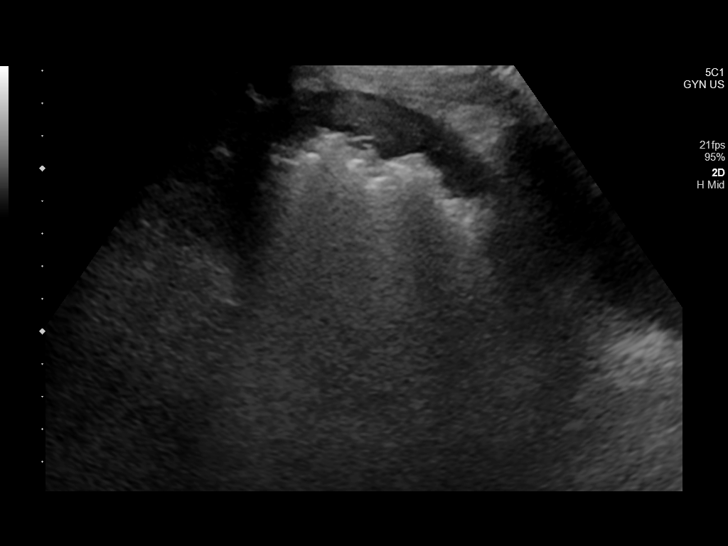
[im 31/54]
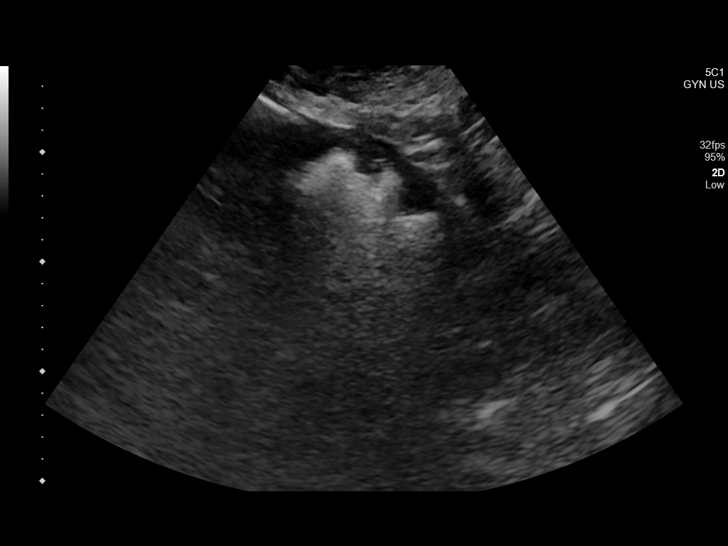
[im 36/54]
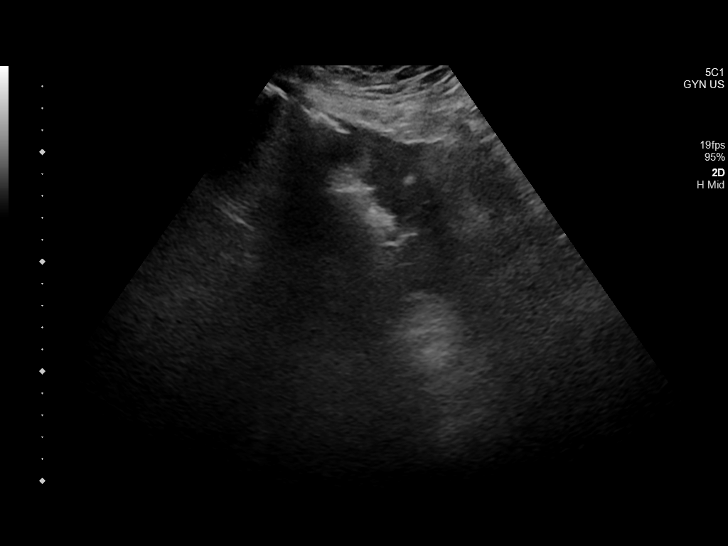
[im 40/54]
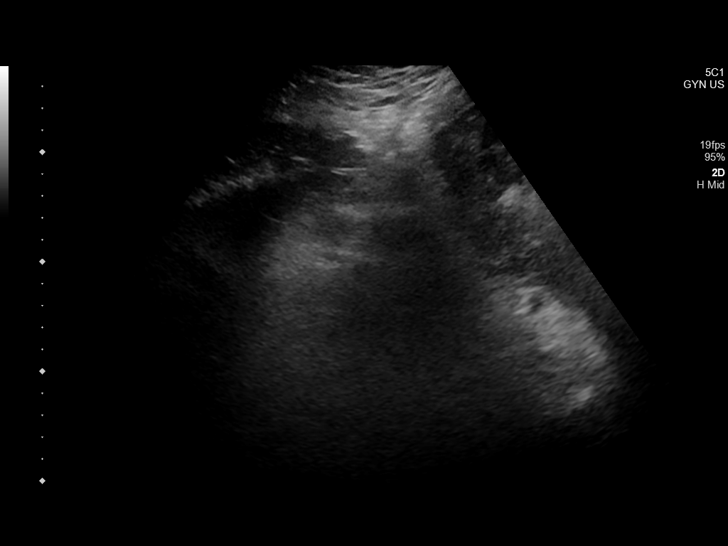
[im 45/54]
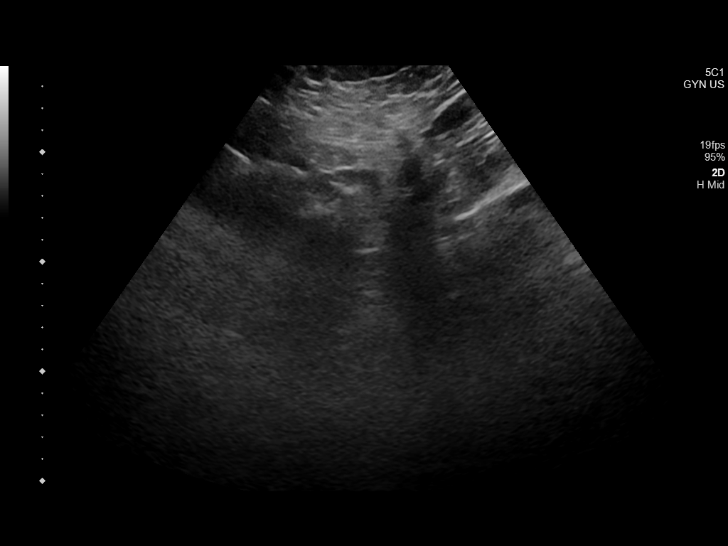
[im 49/54]
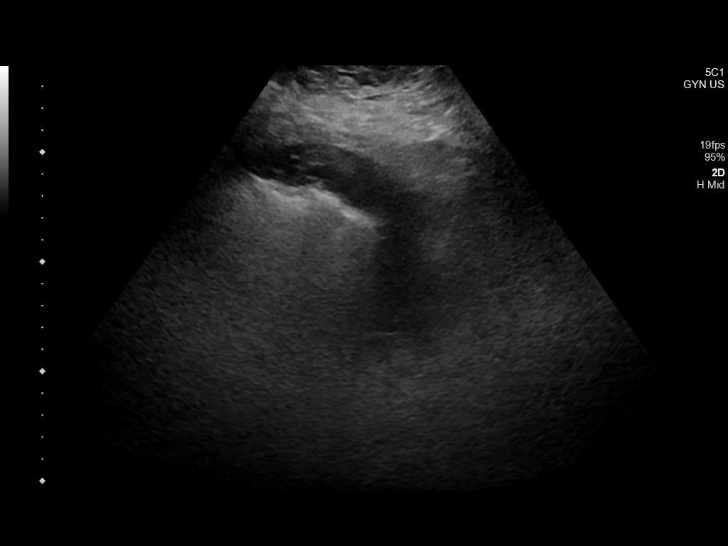
[im 54/54]
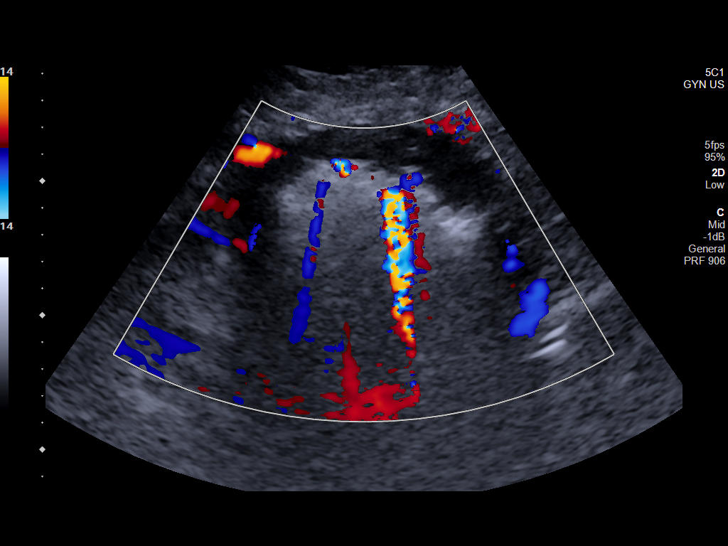

[13 of 25 positions shown; findings below may reference images not displayed]

FINDINGS: Uterus

Measurements: 17.9 x 8.7 x 12.0 cm = volume: 975 mL. Examination
demonstrates similar findings to that seen on recent CT scan with
moderate amount of air throughout the central visualized portion of
the uterus obscuring the endometrium and adjacent parametrial
tissue/myometrium. Findings are compatible with gas-forming
infection.

Endometrium

Not visualized as obscured due to moderate amounts of air as
described.

Right ovary

Not visualized.

Left ovary

Not visualized.

Other findings:  No abnormal free fluid.
IMPRESSION: Moderately enlarged uterus containing moderate amount of air
obscuring the endometrium and adjacent myometrium compatible
gas-forming infection as shown on recent CT. Ovaries not visualized.

ADDENDUM:
These results were called by telephone at the time of interpretation
on 06/02/2021 at [DATE] to provider SHIVANGI MELECIO , who verbally
acknowledged these results.

*** End of Addendum ***
FINDINGS: Uterus

Measurements: 17.9 x 8.7 x 12.0 cm = volume: 975 mL. Examination
demonstrates similar findings to that seen on recent CT scan with
moderate amount of air throughout the central visualized portion of
the uterus obscuring the endometrium and adjacent parametrial
tissue/myometrium. Findings are compatible with gas-forming
infection.

Endometrium

Not visualized as obscured due to moderate amounts of air as
described.

Right ovary

Not visualized.

Left ovary

Not visualized.

Other findings:  No abnormal free fluid.
IMPRESSION: Moderately enlarged uterus containing moderate amount of air
obscuring the endometrium and adjacent myometrium compatible
gas-forming infection as shown on recent CT. Ovaries not visualized.
